# Patient Record
Sex: Female | Born: 1985 | Race: White | Hispanic: No | Smoking: Current every day smoker
Health system: Southern US, Community
[De-identification: ages and names within clinical notes are randomized; demographics above are authoritative.]

## PROBLEM LIST (undated history)

## (undated) DIAGNOSIS — J45909 Unspecified asthma, uncomplicated: Secondary | ICD-10-CM

## (undated) DIAGNOSIS — R569 Unspecified convulsions: Secondary | ICD-10-CM

## (undated) DIAGNOSIS — J449 Chronic obstructive pulmonary disease, unspecified: Secondary | ICD-10-CM

## (undated) DIAGNOSIS — I2699 Other pulmonary embolism without acute cor pulmonale: Secondary | ICD-10-CM

## (undated) DIAGNOSIS — J439 Emphysema, unspecified: Secondary | ICD-10-CM

## (undated) DIAGNOSIS — J189 Pneumonia, unspecified organism: Secondary | ICD-10-CM

## (undated) HISTORY — DX: Emphysema, unspecified: J43.9

## (undated) HISTORY — PX: CHOLECYSTECTOMY: SHX55

## (undated) HISTORY — DX: Other pulmonary embolism without acute cor pulmonale: I26.99

## (undated) HISTORY — DX: Chronic obstructive pulmonary disease, unspecified: J44.9

## (undated) HISTORY — DX: Pneumonia, unspecified organism: J18.9

---
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
## 2005-05-08 ENCOUNTER — Ambulatory Visit (HOSPITAL_COMMUNITY): Admission: RE | Admit: 2005-05-08 | Discharge: 2005-05-08 | Payer: Self-pay | Admitting: Family Medicine

## 2006-01-10 ENCOUNTER — Emergency Department (HOSPITAL_COMMUNITY): Admission: EM | Admit: 2006-01-10 | Discharge: 2006-01-10 | Payer: Self-pay | Admitting: Emergency Medicine

## 2006-01-10 ENCOUNTER — Ambulatory Visit (HOSPITAL_COMMUNITY): Admission: RE | Admit: 2006-01-10 | Discharge: 2006-01-10 | Payer: Self-pay | Admitting: Emergency Medicine

## 2007-03-05 ENCOUNTER — Emergency Department (HOSPITAL_COMMUNITY): Admission: EM | Admit: 2007-03-05 | Discharge: 2007-03-05 | Payer: Self-pay | Admitting: Emergency Medicine

## 2007-03-11 ENCOUNTER — Emergency Department (HOSPITAL_COMMUNITY): Admission: EM | Admit: 2007-03-11 | Discharge: 2007-03-11 | Payer: Self-pay | Admitting: Emergency Medicine

## 2007-04-12 ENCOUNTER — Emergency Department (HOSPITAL_COMMUNITY): Admission: EM | Admit: 2007-04-12 | Discharge: 2007-04-13 | Payer: Self-pay | Admitting: Emergency Medicine

## 2007-05-01 ENCOUNTER — Emergency Department (HOSPITAL_COMMUNITY): Admission: EM | Admit: 2007-05-01 | Discharge: 2007-05-02 | Payer: Self-pay | Admitting: Emergency Medicine

## 2007-09-16 ENCOUNTER — Emergency Department (HOSPITAL_COMMUNITY): Admission: EM | Admit: 2007-09-16 | Discharge: 2007-09-16 | Payer: Self-pay | Admitting: Emergency Medicine

## 2007-09-18 ENCOUNTER — Emergency Department (HOSPITAL_COMMUNITY): Admission: EM | Admit: 2007-09-18 | Discharge: 2007-09-18 | Payer: Self-pay | Admitting: Emergency Medicine

## 2007-09-30 ENCOUNTER — Emergency Department (HOSPITAL_COMMUNITY): Admission: EM | Admit: 2007-09-30 | Discharge: 2007-09-30 | Payer: Self-pay | Admitting: Emergency Medicine

## 2007-10-07 ENCOUNTER — Emergency Department (HOSPITAL_COMMUNITY): Admission: EM | Admit: 2007-10-07 | Discharge: 2007-10-07 | Payer: Self-pay | Admitting: Emergency Medicine

## 2007-11-20 ENCOUNTER — Emergency Department (HOSPITAL_COMMUNITY): Admission: EM | Admit: 2007-11-20 | Discharge: 2007-11-20 | Payer: Self-pay | Admitting: Emergency Medicine

## 2007-12-23 ENCOUNTER — Emergency Department (HOSPITAL_COMMUNITY): Admission: EM | Admit: 2007-12-23 | Discharge: 2007-12-23 | Payer: Self-pay | Admitting: Emergency Medicine

## 2008-01-23 ENCOUNTER — Emergency Department (HOSPITAL_COMMUNITY): Admission: EM | Admit: 2008-01-23 | Discharge: 2008-01-23 | Payer: Self-pay | Admitting: Emergency Medicine

## 2008-02-05 ENCOUNTER — Emergency Department (HOSPITAL_COMMUNITY): Admission: EM | Admit: 2008-02-05 | Discharge: 2008-02-05 | Payer: Self-pay | Admitting: Emergency Medicine

## 2008-02-06 ENCOUNTER — Emergency Department (HOSPITAL_COMMUNITY): Admission: EM | Admit: 2008-02-06 | Discharge: 2008-02-06 | Payer: Self-pay | Admitting: Emergency Medicine

## 2008-02-07 ENCOUNTER — Encounter (HOSPITAL_COMMUNITY): Admission: AD | Admit: 2008-02-07 | Discharge: 2008-03-02 | Payer: Self-pay | Admitting: Emergency Medicine

## 2008-02-10 ENCOUNTER — Emergency Department (HOSPITAL_COMMUNITY): Admission: EM | Admit: 2008-02-10 | Discharge: 2008-02-10 | Payer: Self-pay | Admitting: Emergency Medicine

## 2008-04-06 ENCOUNTER — Emergency Department (HOSPITAL_COMMUNITY): Admission: EM | Admit: 2008-04-06 | Discharge: 2008-04-06 | Payer: Self-pay | Admitting: Emergency Medicine

## 2008-06-30 ENCOUNTER — Emergency Department (HOSPITAL_COMMUNITY): Admission: EM | Admit: 2008-06-30 | Discharge: 2008-06-30 | Payer: Self-pay | Admitting: Emergency Medicine

## 2008-08-15 ENCOUNTER — Emergency Department (HOSPITAL_COMMUNITY): Admission: EM | Admit: 2008-08-15 | Discharge: 2008-08-15 | Payer: Self-pay | Admitting: Emergency Medicine

## 2008-08-25 ENCOUNTER — Emergency Department (HOSPITAL_COMMUNITY): Admission: EM | Admit: 2008-08-25 | Discharge: 2008-08-25 | Payer: Self-pay | Admitting: Emergency Medicine

## 2008-10-18 ENCOUNTER — Emergency Department (HOSPITAL_COMMUNITY): Admission: EM | Admit: 2008-10-18 | Discharge: 2008-10-18 | Payer: Self-pay | Admitting: Emergency Medicine

## 2008-11-29 ENCOUNTER — Emergency Department (HOSPITAL_COMMUNITY): Admission: EM | Admit: 2008-11-29 | Discharge: 2008-11-29 | Payer: Self-pay | Admitting: Emergency Medicine

## 2009-06-06 ENCOUNTER — Emergency Department (HOSPITAL_COMMUNITY): Admission: EM | Admit: 2009-06-06 | Discharge: 2009-06-06 | Payer: Self-pay | Admitting: Emergency Medicine

## 2009-06-15 ENCOUNTER — Emergency Department (HOSPITAL_COMMUNITY): Admission: EM | Admit: 2009-06-15 | Discharge: 2009-06-15 | Payer: Self-pay | Admitting: Emergency Medicine

## 2009-08-13 ENCOUNTER — Emergency Department (HOSPITAL_COMMUNITY): Admission: EM | Admit: 2009-08-13 | Discharge: 2009-08-13 | Payer: Self-pay | Admitting: Emergency Medicine

## 2009-09-14 ENCOUNTER — Emergency Department (HOSPITAL_COMMUNITY): Admission: EM | Admit: 2009-09-14 | Discharge: 2009-09-14 | Payer: Self-pay | Admitting: Emergency Medicine

## 2009-09-23 ENCOUNTER — Emergency Department (HOSPITAL_COMMUNITY): Admission: EM | Admit: 2009-09-23 | Discharge: 2009-09-23 | Payer: Self-pay | Admitting: Emergency Medicine

## 2009-10-28 ENCOUNTER — Emergency Department (HOSPITAL_COMMUNITY): Admission: EM | Admit: 2009-10-28 | Discharge: 2009-10-28 | Payer: Self-pay | Admitting: Emergency Medicine

## 2010-03-26 ENCOUNTER — Encounter: Payer: Self-pay | Admitting: Family Medicine

## 2010-03-27 ENCOUNTER — Encounter: Payer: Self-pay | Admitting: Specialist

## 2010-05-20 LAB — URINALYSIS, ROUTINE W REFLEX MICROSCOPIC
Glucose, UA: NEGATIVE mg/dL
Nitrite: NEGATIVE
Specific Gravity, Urine: 1.025 (ref 1.005–1.030)
pH: 6.5 (ref 5.0–8.0)

## 2010-05-20 LAB — WET PREP, GENITAL: Clue Cells Wet Prep HPF POC: NONE SEEN

## 2010-05-20 LAB — GC/CHLAMYDIA PROBE AMP, GENITAL
Chlamydia, DNA Probe: NEGATIVE
GC Probe Amp, Genital: NEGATIVE

## 2010-05-24 LAB — URINALYSIS, ROUTINE W REFLEX MICROSCOPIC
Hgb urine dipstick: NEGATIVE
Ketones, ur: NEGATIVE mg/dL
Nitrite: NEGATIVE
Protein, ur: NEGATIVE mg/dL

## 2010-05-24 LAB — PREGNANCY, URINE: Preg Test, Ur: NEGATIVE

## 2010-05-24 LAB — RAPID STREP SCREEN (MED CTR MEBANE ONLY): Streptococcus, Group A Screen (Direct): NEGATIVE

## 2010-06-10 LAB — URINALYSIS, ROUTINE W REFLEX MICROSCOPIC
Bilirubin Urine: NEGATIVE
Glucose, UA: NEGATIVE mg/dL
Ketones, ur: NEGATIVE mg/dL
Nitrite: POSITIVE — AB
Protein, ur: 100 mg/dL — AB
Specific Gravity, Urine: >1.03 — ABNORMAL HIGH (ref 1.005–1.030)
pH: 6.5 (ref 5.0–8.0)

## 2010-06-10 LAB — URINE MICROSCOPIC-ADD ON

## 2010-06-12 LAB — URINE CULTURE: Colony Count: >=100000

## 2010-06-12 LAB — URINE MICROSCOPIC-ADD ON

## 2010-06-12 LAB — URINALYSIS, ROUTINE W REFLEX MICROSCOPIC
Glucose, UA: NEGATIVE mg/dL
Glucose, UA: NEGATIVE mg/dL
Nitrite: NEGATIVE
Nitrite: POSITIVE — AB
Protein, ur: 100 mg/dL — AB
Protein, ur: 100 mg/dL — AB
Specific Gravity, Urine: 1.025 (ref 1.005–1.030)
Specific Gravity, Urine: 1.02 (ref 1.005–1.030)
Urobilinogen, UA: 0.2 mg/dL (ref 0.0–1.0)

## 2010-06-20 LAB — URINE MICROSCOPIC-ADD ON

## 2010-06-20 LAB — URINALYSIS, ROUTINE W REFLEX MICROSCOPIC
Nitrite: POSITIVE — AB
Specific Gravity, Urine: <1.005 — ABNORMAL LOW (ref 1.005–1.030)
Urobilinogen, UA: 0.2 mg/dL (ref 0.0–1.0)

## 2010-06-20 LAB — URINE CULTURE

## 2010-07-21 NOTE — H&P (Signed)
[name], DAHLEM               ACCOUNT NO.:  [id number]   MEDICAL RECORD NO.:  [id number]          PATIENT TYPE:   LOCATION:  LDR2                          FACILITY:  [facility]   PHYSICIAN:  Tilda Burrow, M.D. DATE OF BIRTH:  02-Mar-1986   DATE OF ADMISSION:  02/02/2004  DATE OF DISCHARGE:  LH                                HISTORY & PHYSICAL   REASON FOR ADMISSION:  Pregnancy at 39 weeks and 3 days for elective  induction of labor due to maternal discomfort, transportation concerns.   HISTORY OF PRESENT ILLNESS:  Diane Norton is an 25 year old, gravida 2, para 1, with  EDC of December 1, who desires elective induction of labor.  Discussed risks  and benefits.  The patient is aware since she had previously been induced  with her other pregnancy and desires same with this pregnancy.   PAST MEDICAL HISTORY:  Negative.   PAST SURGICAL HISTORY:  Negative.   ALLERGIES:  No known drug allergies.   SOCIAL HISTORY:  She is single.  The father of the baby is supportive.   PRENATAL COURSE:  Essentially uneventful.  Blood type is O positive.  Rubella is immune.  Hepatitis B surface antigen negative.  HIV nonreactive.  Serology negative.  Pap within normal limits.  GC and chlamydia:  Both  cultures were negative.  MSAFP was normal.  GBS was negative.   PHYSICAL EXAMINATION:  Today, weight is 162.  Blood pressure 112/62.  Fetal  heart rate 140, strong and regular.  There is good fetal movement noted per  the patient.  Cervix is 2 to 3 cm, 70% effaced, minus 1 station.   PLAN:  She is to admitted for Pitocin induction of labor on Wednesday  morning at 5:30.     [name]   DL/MEDQ  D:  04/54/0981  T:  01/31/2004  Job:  [id number]

## 2010-07-21 NOTE — Op Note (Signed)
   [name], Diane Norton                         ACCOUNT NO.:  [id number]   MEDICAL RECORD NO.:  [id number]                   PATIENT TYPE:  INP   LOCATION:  A416                                 FACILITY:  [facility]   PHYSICIAN:  Tilda Burrow, M.D.              DATE OF BIRTH:  [birth year]   DATE OF PROCEDURE:  01/08/2002  DATE OF DISCHARGE:  01/10/2002                                 OPERATIVE REPORT   TIME:  8:20 a.m.   PROCEDURE:  Lumbar epidural catheter placement.   SURGEON:  Tilda Burrow, M.D.   INDICATIONS:  A 25 year old prime nulliparous female admitted in labor,  requesting epidural analgesia.   DESCRIPTION OF PROCEDURE:  The patient was placed in the sitting position;  prepped and draped in the usual fashion for lumbar epidural catheter  placement with loss of resistance used after the back was appropriately  prepped and draped, with local skin wheal raised with lidocaine 1%.  A 19-  gauge Touhy needle was used to identify the epidural space, with successful  identification using loss of resistance technique.   The 5 mL test dose of 1.5% lidocaine with epinephrine x5 mL was then used  and then the epidural catheter threaded, with successful placement and  removal of the Touhy needle and taping of the catheter to the back.  The  patient had a bolus of 7 mL of 0.25% Marcaine solution instilled and then a  12 mL continuous infusion initiated. The procedure was successfully  completed with analgesic level at T12 at 10 minutes postprocedure.  Blood  pressures remained acceptable.  The patient tolerated the procedure well.                                               Tilda Burrow, M.D.    JVF/MEDQ  D:  03/28/2002  T:  03/28/2002  Job:  [id number]

## 2010-07-21 NOTE — Group Therapy Note (Signed)
[name], Diane Norton               ACCOUNT NO.:  [id number]   MEDICAL RECORD NO.:  [id number]          PATIENT TYPE:  INP   LOCATION:  LDR1                          FACILITY:  [facility]   PHYSICIAN:  Tilda Burrow, M.D. DATE OF BIRTH:  03-26-1985   DATE OF PROCEDURE:  02/02/2004  DATE OF DISCHARGE:                                   PROGRESS NOTE   Onset of labor is February 02, 2004 at 6:15 a.m. Date of delivery February 02, 2004. Delivery time at 10:30 a.m. Length of first stage labor 4 hours  and 2 minutes. Length of second stage labor 13 minutes. Length of third  stage labor 5 minutes.   DELIVERY NOTE:  [name] had a normal spontaneous delivery of a viable female  infant under epidural anesthesia. Upon delivery of head, a nuchal cord was  noted which was loosened without difficulty and the infant slipped through  without complications. Upon delivery, infant was thoroughly suctioned,  dried, and placed on the mother's abdomen. Cord clamped and cut. Apgars were  9 and 9. Infant weight 7 pounds 8 ounces. Upon inspection, peritoneum was  noted to be intact with slight bruising at the base of the perineum. Third  stage of labor was actually managed with 20 units of Pitocin with D5 LR at a  rapid rate. Placenta was delivered spontaneously via Schultz's mechanism.  Three-vessel cord was noted upon inspection. Membranes were noted to be  intact upon inspection_. Estimated blood loss 350 cc. Epidural catheter was  removed with blue tip intact. Infant and mother stabilized and transferred  out to the post partum unit in stable condition.     [name]   DL/MEDQ  D:  40/98/1191  T:  02/02/2004  Job:  [id number]

## 2010-07-21 NOTE — Op Note (Signed)
   [name], Diane Norton                         ACCOUNT NO.:  [id number]   MEDICAL RECORD NO.:  [id number]                   PATIENT TYPE:  INP   LOCATION:  A416                                 FACILITY:  [facility]   PHYSICIAN:  Tilda Burrow, M.D.              DATE OF BIRTH:  [birth year]   DATE OF PROCEDURE:  DATE OF DISCHARGE:                                 OPERATIVE REPORT   DELIVERY NOTE:  After an hour of being complete at +2 station the patient  still had no urge to push.  However, with directed pushing she did push  quite effectively.  She began pushing at approximately 1300 and after a  brief second stage delivered a viable female infant at 61.  A loose nuchal  cord was easily reduced and directly following delivery of the body there  was noted to be a large amount of what appeared to be meconium stained fluid  and terminal meconium.  The infant was not stimulated and brought directly  to the radiant warmer, however, she did make some respiratory efforts and  therefore endotracheal suctioning was deferred.   She did, however, receive vigorous DeLee suctioning.  Apgars were 8 and 9.  Weight 7 pounds 2 ounces.  Twenty units of Pitocin diluted in 1000 cc of  lactated Ringers was then infused rapidly IV.  The placenta separated  spontaneously and delivered by controlled cord traction at 1335.  It was  inspected and appears to be intact with a 3-vessel cord.  There is only  minimal meconium staining to the membranes.  The vagina was then inspected  and a no lacerations were found.  The fundus was massaged until firm.  Estimated blood loss approximately 400 cc.  The epidural catheter was  removed with the blue tip noted to be intact.     Jacklyn Shell, C.N.M.          Tilda Burrow, M.D.    FC/MEDQ  D:  01/08/2002  T:  01/08/2002  Job:  [id number]   cc:   [facility]

## 2010-07-21 NOTE — H&P (Signed)
   [name], Diane Norton NO.:  [id number]   MEDICAL RECORD NO.:  [id number]                  PATIENT TYPE:   LOCATION:                                       FACILITY:   PHYSICIAN:  Tilda Burrow, M.D.              DATE OF BIRTH:   DATE OF ADMISSION:  01/07/2002  DATE OF DISCHARGE:                                HISTORY & PHYSICAL   REASON FOR ADMISSION:  Pregnancy at 63 weeks' gestation for elective  induction of labor due to maternal fatigue.   HISTORY OF PRESENT ILLNESS:  The patient is a 25 year old gravida 1 with a  consistent EDC of January 07, 2002, who is in today for a visit, complains  of malaise, feeling bad, and inability to sleep, and requesting induction.  After discussing risks and benefits of induction of labor versus going into  labor on her own, the patient still desires to continue with induction of  labor.   PAST MEDICAL HISTORY:  1. 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  2. Asthma.  3. Depression.   MEDICATIONS:  She is not on any medications right now, except prenatal  vitamins.   PAST SURGICAL HISTORY:  Negative.   ALLERGIES:  She has seasonal allergies.   SOCIAL HISTORY:  She is single and she lives with the father of the baby and  they have a stable relationship.   PRENATAL COURSE:  Has essentially been uneventful.  Blood type is O  positive.  UDS was negative.  Rubella is pending.  Hepatitis B surface  antigen is negative.  HIV is negative.  Pap is class 1.  GC and Chlamydia  are both negative.  MSAFP within normal limits.  A 28 week hemoglobin was  11.8, 28 week hematocrit 35.4, one hour glucose tolerance is 100, GBS was  negative.   PHYSICAL EXAMINATION:  VITAL SIGNS:  Blood pressure 110/68, weight is 181  pounds, fundal height is 36 cm, fetal heart tone is 130, strong and regular,  and the patient states the baby is being good and active with fetal  movement.  PELVIC:  Cervix is 1-2 cm, 70% effaced and -1  station.  I discussed plan of  care with Dr. Christin Bach and he is in agreement with proceeding with  induction of labor at mother's request.   PLAN:  We are going to admit for __________ induction of labor and PNVAM.     Zerita Boers, Lanier Clam                      Tilda Burrow, M.D.    DL/MEDQ  D:  32/44/0102  T:  01/07/2002  Job:  [id number]   cc:   [facility]

## 2010-07-21 NOTE — Op Note (Signed)
[name], Diane Norton               ACCOUNT NO.:  [id number]   MEDICAL RECORD NO.:  [id number]          PATIENT TYPE:  INP   LOCATION:  LDR1                          FACILITY:  [facility]   PHYSICIAN:  Lazaro Arms, M.D.   DATE OF BIRTH:  28-Oct-1985   DATE OF PROCEDURE:  02/02/2004  DATE OF DISCHARGE:                                 OPERATIVE REPORT   EPIDURAL PLACEMENT NOTE:  [name] is an 25 year old gravida 2, para 1, at 39+  weeks' gestation, who was admitted this morning for induction.  She is  complaining of pain with contractions and requesting an epidural.   The patient is placed in sitting position, Betadine prep is used.  The L3-4  interspace is identified, 1% lidocaine is injected, a field drape placed.  A  17-gauge Tuohy needle is used and a loss of resistance technique employed  and with one pass, the epidural space is found without difficulty.  Bupivacaine 0.125% 10 mL is given through the needle.  The epidural catheter  is then fed 5 cm into the epidural space.  An additional 10 mL is given as a  bolus without ill effects.  The epidural is then taped down and the  continuous infusion of 0.125% bupivacaine with 2 mcg/mL of fentanyl is begun  at 12 mL/hr.  The patient tolerated the procedure well.  She is receiving  comfort, and fetal heart rate tracing and blood pressure are stable.     Luth   LHE/MEDQ  D:  02/02/2004  T:  02/02/2004  Job:  [id number]

## 2010-11-24 LAB — DIFFERENTIAL
Eosinophils Absolute: 0.2
Eosinophils Relative: 2
Lymphs Abs: 4.5 — ABNORMAL HIGH

## 2010-11-24 LAB — CBC
RBC: 4.41
WBC: 9.5

## 2010-11-24 LAB — COMPREHENSIVE METABOLIC PANEL
ALT: 14
AST: 15
CO2: 25
Calcium: 8.9
Chloride: 105
GFR calc Af Amer: >60
GFR calc non Af Amer: >60
Potassium: 3.8
Sodium: 140
Total Bilirubin: 0.3

## 2010-11-24 LAB — LIPASE, BLOOD: Lipase: 15

## 2010-11-24 LAB — AMYLASE: Amylase: 42

## 2010-11-30 LAB — COMPREHENSIVE METABOLIC PANEL
BUN: 12
Calcium: 8.8
Creatinine, Ser: 0.57
Glucose, Bld: 106 — ABNORMAL HIGH
Sodium: 137
Total Protein: 6.4

## 2010-11-30 LAB — CBC
HCT: 37
Hemoglobin: 12.7
MCHC: 34.3
MCV: 95.3
RDW: 13.4

## 2010-11-30 LAB — DIFFERENTIAL
Lymphs Abs: 3.1
Monocytes Relative: 7
Neutro Abs: 7.1
Neutrophils Relative %: 64

## 2010-11-30 LAB — URINALYSIS, ROUTINE W REFLEX MICROSCOPIC
Nitrite: NEGATIVE
pH: 6.5

## 2010-12-05 LAB — BASIC METABOLIC PANEL
BUN: 13
CO2: 27
Calcium: 8.6
Glucose, Bld: 87
Sodium: 137

## 2010-12-05 LAB — CBC
HCT: 35.5 — ABNORMAL LOW
Hemoglobin: 12.1
MCHC: 33.9
RDW: 16.7 — ABNORMAL HIGH

## 2010-12-05 LAB — DIFFERENTIAL
Basophils Absolute: 0.1
Basophils Relative: 1
Eosinophils Relative: 2
Monocytes Absolute: 0.7
Neutro Abs: 4.4

## 2010-12-05 LAB — URINALYSIS, ROUTINE W REFLEX MICROSCOPIC
Bilirubin Urine: NEGATIVE
Hgb urine dipstick: NEGATIVE
Ketones, ur: NEGATIVE
Urobilinogen, UA: 0.2
pH: 5.5

## 2010-12-05 LAB — PREGNANCY, URINE: Preg Test, Ur: NEGATIVE

## 2010-12-07 LAB — CBC
Platelets: 233 10*3/uL (ref 150–400)
WBC: 9.1 10*3/uL (ref 4.0–10.5)

## 2010-12-07 LAB — DIFFERENTIAL
Eosinophils Absolute: 0.1 10*3/uL (ref 0.0–0.7)
Lymphocytes Relative: 30 % (ref 12–46)
Lymphs Abs: 2.7 10*3/uL (ref 0.7–4.0)
Neutrophils Relative %: 58 % (ref 43–77)

## 2010-12-07 LAB — BASIC METABOLIC PANEL
BUN: 8 mg/dL (ref 6–23)
Creatinine, Ser: 0.47 mg/dL (ref 0.4–1.2)
GFR calc non Af Amer: >60 mL/min (ref >60)

## 2011-05-01 ENCOUNTER — Other Ambulatory Visit (HOSPITAL_COMMUNITY)
Admission: RE | Admit: 2011-05-01 | Discharge: 2011-05-01 | Disposition: A | Discharge status: Home / Self Care | Payer: Self-pay | Source: Ambulatory Visit | Attending: Unknown Physician Specialty | Admitting: Unknown Physician Specialty

## 2011-05-01 ENCOUNTER — Other Ambulatory Visit: Payer: Self-pay | Admitting: Nurse Practitioner

## 2011-05-01 DIAGNOSIS — R87619 Unspecified abnormal cytological findings in specimens from cervix uteri: Secondary | ICD-10-CM | POA: Insufficient documentation

## 2011-05-01 DIAGNOSIS — R87613 High grade squamous intraepithelial lesion on cytologic smear of cervix (HGSIL): Secondary | ICD-10-CM | POA: Insufficient documentation

## 2011-09-18 ENCOUNTER — Encounter (HOSPITAL_COMMUNITY): Payer: Self-pay

## 2011-09-18 ENCOUNTER — Emergency Department (HOSPITAL_COMMUNITY)
Admission: EM | Admit: 2011-09-18 | Discharge: 2011-09-18 | Disposition: A | Discharge status: Home / Self Care | Payer: Self-pay | Attending: Emergency Medicine | Admitting: Emergency Medicine

## 2011-09-18 DIAGNOSIS — N390 Urinary tract infection, site not specified: Secondary | ICD-10-CM | POA: Insufficient documentation

## 2011-09-18 DIAGNOSIS — R300 Dysuria: Secondary | ICD-10-CM | POA: Insufficient documentation

## 2011-09-18 DIAGNOSIS — J45909 Unspecified asthma, uncomplicated: Secondary | ICD-10-CM | POA: Insufficient documentation

## 2011-09-18 DIAGNOSIS — F17200 Nicotine dependence, unspecified, uncomplicated: Secondary | ICD-10-CM | POA: Insufficient documentation

## 2011-09-18 DIAGNOSIS — R109 Unspecified abdominal pain: Secondary | ICD-10-CM | POA: Insufficient documentation

## 2011-09-18 HISTORY — DX: Unspecified asthma, uncomplicated: J45.909

## 2011-09-18 LAB — URINE MICROSCOPIC-ADD ON

## 2011-09-18 LAB — URINALYSIS, ROUTINE W REFLEX MICROSCOPIC
Glucose, UA: NEGATIVE mg/dL
Leukocytes, UA: NEGATIVE
Protein, ur: NEGATIVE mg/dL
pH: 6 (ref 5.0–8.0)

## 2011-09-18 MED ORDER — PHENAZOPYRIDINE HCL 100 MG PO TABS
200.0000 mg | ORAL_TABLET | Freq: Once | ORAL | Status: AC
  Administered 2011-09-18: 200 mg via ORAL
  Filled 2011-09-18: qty 2

## 2011-09-18 MED ORDER — PHENAZOPYRIDINE HCL 200 MG PO TABS: 200.0000 mg | ORAL_TABLET | Freq: Three times a day (TID) | ORAL | Status: AC

## 2011-09-18 MED ORDER — NITROFURANTOIN MACROCRYSTAL 100 MG PO CAPS: 100.0000 mg | ORAL_CAPSULE | Freq: Two times a day (BID) | ORAL | Status: AC

## 2011-09-18 MED ORDER — NITROFURANTOIN MACROCRYSTAL 100 MG PO CAPS
100.0000 mg | ORAL_CAPSULE | Freq: Once | ORAL | Status: AC
  Administered 2011-09-18: 100 mg via ORAL
  Filled 2011-09-18: qty 1

## 2011-09-18 NOTE — ED Provider Notes (Signed)
History     CSN: [id number]  Arrival date & time 09/18/11  0228   First MD Initiated Contact with Patient 09/18/11 0320      Chief Complaint  Patient presents with  . Flank Pain    (Consider location/radiation/quality/duration/timing/severity/associated sxs/prior treatment) HPI  Diane Norton is a 26 y.o. female who presents to the Emergency Department complaining of urinary frequency and urgency x 3 days associated with right flank pain. She denies fever, chills, nausea, vomiting. She has taken no medicines.   Past Medical History  Diagnosis Date  . Asthma     Past Surgical History  Procedure Date  . Cholecystectomy     History reviewed. No pertinent family history.  History  Substance Use Topics  . Smoking status: Current Everyday Smoker  . Smokeless tobacco: Not on file  . Alcohol Use: No    OB History    Grav Para Term Preterm Abortions TAB SAB Ect Mult Living                  Review of Systems  Constitutional: Negative for fever.       10 Systems reviewed and are negative for acute change except as noted in the HPI.  HENT: Negative for congestion.   Eyes: Negative for discharge and redness.  Respiratory: Negative for cough and shortness of breath.   Cardiovascular: Negative for chest pain.  Gastrointestinal: Negative for vomiting and abdominal pain.  Genitourinary: Positive for dysuria, urgency and flank pain.  Musculoskeletal: Negative for back pain.  Skin: Negative for rash.  Neurological: Negative for syncope, numbness and headaches.  Psychiatric/Behavioral:       No behavior change.    Allergies  Ciprofloxacin and Penicillins  Home Medications  No current outpatient prescriptions on file.  BP 110/69  Pulse 102  Temp 99.4 F (37.4 C) (Oral)  Resp 20  Ht 5\' 4"  (1.626 m)  Wt 150 lb (68.04 kg)  BMI 25.75 kg/m2  SpO2 98%  LMP 09/15/2011  Physical Exam  Nursing note and vitals reviewed. Constitutional: She appears well-developed and  well-nourished. No distress.       Awake, alert, nontoxic appearance.  HENT:  Head: Atraumatic.  Eyes: Right eye exhibits no discharge. Left eye exhibits no discharge.  Neck: Neck supple.  Pulmonary/Chest: Effort normal. She exhibits no tenderness.  Abdominal: Soft. There is no tenderness. There is no rebound.  Genitourinary:       No cva tenderness (Patient pulled away from me before I touches either flank)  Musculoskeletal: She exhibits no tenderness.       Baseline ROM, no obvious new focal weakness.  Neurological:       Mental status and motor strength appears baseline for patient and situation.  Skin: No rash noted.  Psychiatric: She has a normal mood and affect.    ED Course  Procedures (including critical care time) Results for orders placed during the hospital encounter of 09/18/11  URINALYSIS, ROUTINE W REFLEX MICROSCOPIC      Component Value Range   Color, Urine STRAW (*) YELLOW   APPearance HAZY (*) CLEAR   Specific Gravity, Urine 1.015  1.005 - 1.030   pH 6.0  5.0 - 8.0   Glucose, UA NEGATIVE  NEGATIVE mg/dL   Hgb urine dipstick MODERATE (*) NEGATIVE   Bilirubin Urine NEGATIVE  NEGATIVE   Ketones, ur NEGATIVE  NEGATIVE mg/dL   Protein, ur NEGATIVE  NEGATIVE mg/dL   Urobilinogen, UA 0.2  0.0 - 1.0 mg/dL  Nitrite POSITIVE (*) NEGATIVE   Leukocytes, UA NEGATIVE  NEGATIVE  URINE MICROSCOPIC-ADD ON      Component Value Range   Squamous Epithelial / LPF MANY (*) RARE   WBC, UA 21-50  <3 WBC/hpf   RBC / HPF 3-6  <3 RBC/hpf   Bacteria, UA MANY (*) RARE   Urine-Other MUCOUS PRESENT            MDM  Patient with urinary symptoms x 3 days. UA with infection. Initiated antibiotic therapy and pyridium. Pt stable in ED with no significant deterioration in condition.The patient appears reasonably screened and/or stabilized for discharge and I doubt any other medical condition or other [facility] requiring further screening, evaluation, or treatment in the ED at this time  prior to discharge.  MDM Reviewed: nursing note and vitals Interpretation: labs           Nicoletta Dress. Colon Branch, MD 09/18/11 [phone]

## 2011-09-18 NOTE — ED Notes (Signed)
Pt c/o right flank pain and burning with urination for 3 days

## 2012-12-23 ENCOUNTER — Emergency Department (HOSPITAL_COMMUNITY)
Admission: EM | Admit: 2012-12-23 | Discharge: 2012-12-23 | Disposition: A | Discharge status: Home / Self Care | Payer: Self-pay | Attending: Emergency Medicine | Admitting: Emergency Medicine

## 2012-12-23 ENCOUNTER — Encounter (HOSPITAL_COMMUNITY): Payer: Self-pay | Admitting: Emergency Medicine

## 2012-12-23 ENCOUNTER — Emergency Department (HOSPITAL_COMMUNITY): Payer: Self-pay

## 2012-12-23 DIAGNOSIS — J329 Chronic sinusitis, unspecified: Secondary | ICD-10-CM

## 2012-12-23 DIAGNOSIS — J40 Bronchitis, not specified as acute or chronic: Secondary | ICD-10-CM

## 2012-12-23 DIAGNOSIS — Z880 Allergy status to penicillin: Secondary | ICD-10-CM | POA: Insufficient documentation

## 2012-12-23 DIAGNOSIS — F17200 Nicotine dependence, unspecified, uncomplicated: Secondary | ICD-10-CM | POA: Insufficient documentation

## 2012-12-23 DIAGNOSIS — J45909 Unspecified asthma, uncomplicated: Secondary | ICD-10-CM | POA: Insufficient documentation

## 2012-12-23 MED ORDER — CLINDAMYCIN HCL 150 MG PO CAPS: ORAL_CAPSULE | ORAL | Status: DC

## 2012-12-23 MED ORDER — PHENYLEPH-DIPHENHYD-CODEINE 7.5-10-7.5 MG/5ML PO SYRP: ORAL_SOLUTION | ORAL | Status: DC

## 2012-12-23 MED ORDER — HYDROCOD POLST-CHLORPHEN POLST 10-8 MG/5ML PO LQCR
5.0000 mL | Freq: Once | ORAL | Status: AC
  Administered 2012-12-23: 5 mL via ORAL
  Filled 2012-12-23: qty 5

## 2012-12-23 MED ORDER — PREDNISONE 10 MG PO TABS: ORAL_TABLET | ORAL | Status: DC

## 2012-12-23 NOTE — ED Notes (Signed)
Pt states that she already has a prescription for prednisone as she was seen recently for same at [facility]

## 2012-12-23 NOTE — ED Provider Notes (Signed)
CSN: [id number]     Arrival date & time 12/23/12  1321 History   First MD Initiated Contact with Patient 12/23/12 1346     Chief Complaint  Patient presents with  . Cough   (Consider location/radiation/quality/duration/timing/severity/associated sxs/prior Treatment) Patient is a 27 y.o. female presenting with cough. The history is provided by the patient.  Cough Cough characteristics:  Productive Sputum characteristics:  Yellow Severity:  Moderate Onset quality:  Gradual Duration:  4 days Timing:  Intermittent Progression:  Worsening Chronicity:  New Smoker: yes   Context: sick contacts and weather changes   Relieved by:  Nothing Worsened by:  Deep breathing Associated symptoms: chills and sinus congestion   Associated symptoms: no chest pain, no eye discharge, no shortness of breath and no wheezing   Risk factors: no recent travel     Past Medical History  Diagnosis Date  . Asthma    Past Surgical History  Procedure Laterality Date  . Cholecystectomy     History reviewed. No pertinent family history. History  Substance Use Topics  . Smoking status: Current Every Day Smoker  . Smokeless tobacco: Not on file  . Alcohol Use: No   OB History   Grav Para Term Preterm Abortions TAB SAB Ect Mult Living                 Review of Systems  Constitutional: Positive for chills. Negative for activity change.       All ROS Neg except as noted in HPI  HENT: Negative for nosebleeds.   Eyes: Negative for photophobia and discharge.  Respiratory: Positive for cough. Negative for shortness of breath and wheezing.   Cardiovascular: Negative for chest pain and palpitations.  Gastrointestinal: Negative for abdominal pain and blood in stool.  Genitourinary: Negative for dysuria, frequency and hematuria.  Musculoskeletal: Negative for arthralgias, back pain and neck pain.  Skin: Negative.   Neurological: Negative for dizziness, seizures and speech difficulty.   Psychiatric/Behavioral: Negative for hallucinations and confusion.    Allergies  Darvocet; Penicillins; Bactrim; and Ciprofloxacin  Home Medications  No current outpatient prescriptions on file. BP 110/56  Pulse 62  Temp(Src) 98.1 F (36.7 C) (Oral)  Resp 20  SpO2 95%  LMP 12/23/2012 Physical Exam  Nursing note and vitals reviewed. Constitutional: She is oriented to person, place, and time. She appears well-developed and well-nourished.  Non-toxic appearance.  HENT:  Head: Normocephalic.  Right Ear: Tympanic membrane and external ear normal.  Left Ear: Tympanic membrane and external ear normal.  Nasal congestion present.  Eyes: EOM and lids are normal. Pupils are equal, round, and reactive to light.  Neck: Normal range of motion. Neck supple. Carotid bruit is not present.  Cardiovascular: Normal rate, regular rhythm, normal heart sounds, intact distal pulses and normal pulses.   Pulmonary/Chest: Breath sounds normal. No respiratory distress.  Scattered rhonchi present.  Abdominal: Soft. Bowel sounds are normal. There is no tenderness. There is no guarding.  Musculoskeletal: Normal range of motion.  Lymphadenopathy:       Head (right side): No submandibular adenopathy present.       Head (left side): No submandibular adenopathy present.    She has no cervical adenopathy.  Neurological: She is alert and oriented to person, place, and time. She has normal strength. No cranial nerve deficit or sensory deficit.  Skin: Skin is warm and dry.  Psychiatric: She has a normal mood and affect. Her speech is normal.    ED Course  Procedures (including critical  care time) Labs Review Labs Reviewed - No data to display Imaging Review Dg Chest 2 View  12/23/2012   CLINICAL DATA:  Productive cough  EXAM: CHEST  2 VIEW  COMPARISON:  December 22, 2012  FINDINGS: The lungs are mildly hyperexpanded but clear. Heart size and pulmonary vascularity are normal. No adenopathy. No bone lesions.  There is slight upper thoracic levoscoliosis.  IMPRESSION: No edema or consolidation.   Electronically Signed   By: Bretta Bang M.D.   On: 12/23/2012 13:56    EKG Interpretation   None       MDM  No diagnosis found. **I have reviewed nursing notes, vital signs, and all appropriate lab and imaging results for this patient.*  Chest xray is negative for pneumonia or acute problem. Pt treated with clindamycin, endal, and prednisone. Medical social worker came to ED and provided pt with assistance with medications.    Kathie Dike, PA-C 12/23/12 2254

## 2012-12-23 NOTE — ED Notes (Signed)
Pt c/o prod cough with yellow phlegm x 4 days. States has had fever. Nad. Light cough noted.

## 2012-12-23 NOTE — ED Notes (Signed)
Pt with productive cough for several days with fever and rib and back pain

## 2012-12-24 NOTE — ED Provider Notes (Signed)
Medical screening examination/treatment/procedure(s) were performed by non-physician practitioner and as supervising physician I was immediately available for consultation/collaboration.  EKG Interpretation   None         Kristen N Ward, DO 12/24/12 0715 

## 2012-12-25 NOTE — Care Management Note (Signed)
    Page 1 of 1   12/23/2012     4:05:46 PM   CARE MANAGEMENT NOTE 12/23/2012  Patient:  Diane Norton, Diane Norton   Account Number:  [id number]  Date Initiated:  12/23/2012  Documentation initiated by:  Rosemary Holms  Subjective/Objective Assessment:   Pt seen in ED with no insurance. Requested assistance with Rxs. MATCH voucher given to pt. Program explained to pt.     Action/Plan:   Anticipated DC Date:  12/23/2012   Anticipated DC Plan:  HOME/SELF CARE      DC Planning Services  CM consult  MATCH Program      Choice offered to / List presented to:             Status of service:  Completed, signed off Medicare Important Message given?   (If response is "NO", the following Medicare IM given date fields will be blank) Date Medicare IM given:   Date Additional Medicare IM given:    Discharge Disposition:  HOME/SELF CARE  Per UR Regulation:    If discussed at Long Length of Stay Meetings, dates discussed:    Comments:  12/23/12 1600 Maisen Klingler Leanord Hawking RN BSN CM

## 2013-11-14 ENCOUNTER — Emergency Department (HOSPITAL_COMMUNITY)
Admission: EM | Admit: 2013-11-14 | Discharge: 2013-11-14 | Disposition: A | Discharge status: Home / Self Care | Payer: Self-pay | Attending: Emergency Medicine | Admitting: Emergency Medicine

## 2013-11-14 ENCOUNTER — Encounter (HOSPITAL_COMMUNITY): Payer: Self-pay | Admitting: Emergency Medicine

## 2013-11-14 DIAGNOSIS — J45909 Unspecified asthma, uncomplicated: Secondary | ICD-10-CM | POA: Insufficient documentation

## 2013-11-14 DIAGNOSIS — F17200 Nicotine dependence, unspecified, uncomplicated: Secondary | ICD-10-CM | POA: Insufficient documentation

## 2013-11-14 DIAGNOSIS — K0889 Other specified disorders of teeth and supporting structures: Secondary | ICD-10-CM

## 2013-11-14 DIAGNOSIS — K029 Dental caries, unspecified: Secondary | ICD-10-CM | POA: Insufficient documentation

## 2013-11-14 DIAGNOSIS — K089 Disorder of teeth and supporting structures, unspecified: Secondary | ICD-10-CM | POA: Insufficient documentation

## 2013-11-14 DIAGNOSIS — Z880 Allergy status to penicillin: Secondary | ICD-10-CM | POA: Insufficient documentation

## 2013-11-14 MED ORDER — HYDROCODONE-ACETAMINOPHEN 5-325 MG PO TABS
1.0000 | ORAL_TABLET | Freq: Once | ORAL | Status: AC
  Administered 2013-11-14: 1 via ORAL
  Filled 2013-11-14: qty 1

## 2013-11-14 MED ORDER — HYDROCODONE-ACETAMINOPHEN 5-325 MG PO TABS: ORAL_TABLET | ORAL | Status: DC

## 2013-11-14 MED ORDER — CLINDAMYCIN HCL 300 MG PO CAPS
300.0000 mg | ORAL_CAPSULE | Freq: Three times a day (TID) | ORAL | Status: DC
Start: 2013-11-14 — End: 2014-03-04

## 2013-11-14 MED ORDER — CLINDAMYCIN HCL 150 MG PO CAPS
300.0000 mg | ORAL_CAPSULE | Freq: Once | ORAL | Status: AC
  Administered 2013-11-14: 300 mg via ORAL
  Filled 2013-11-14: qty 2

## 2013-11-14 NOTE — ED Notes (Signed)
Verbalized understanding of no driving within 4 hours of taking pain meds due to meds cause drowsiness

## 2013-11-14 NOTE — ED Notes (Signed)
Pt c/o right sided dental pain since yesterday afternoon. States "I don't know if I have an abscess or not, theres a bump and my whole right side of my mouth hurts."

## 2013-11-14 NOTE — ED Provider Notes (Signed)
CSN: [id number]     Arrival date & time 11/14/13  1922 History   First MD Initiated Contact with Patient 11/14/13 2002     Chief Complaint  Patient presents with  . Dental Pain     (Consider location/radiation/quality/duration/timing/severity/associated sxs/prior Treatment) Patient is a 28 y.o. female presenting with tooth pain.  Dental Pain Associated symptoms: no congestion, no facial swelling, no fever, no headaches and no neck pain    Diane Norton is a 28 y.o. female who presents to the Emergency Department complaining of dental pain for one day. States that she has tenderness in along the right lower second molar. She states that the tooth is decayed and she is also noticed a "bump" along the right lower jaw. She reports pain radiating to her right ear. She's used over-the-counter medications in [place] without relief. She denies fever, chills, difficulty swallowing or breathing.   Past Medical History  Diagnosis Date  . Asthma    Past Surgical History  Procedure Laterality Date  . Cholecystectomy     No family history on file. History  Substance Use Topics  . Smoking status: Current Every Day Smoker -- 1.00 packs/day  . Smokeless tobacco: Not on file  . Alcohol Use: No   OB History   Grav Para Term Preterm Abortions TAB SAB Ect Mult Living                 Review of Systems  Constitutional: Negative for fever and appetite change.  HENT: Positive for dental problem. Negative for congestion, facial swelling, sore throat and trouble swallowing.   Eyes: Negative for pain and visual disturbance.  Musculoskeletal: Negative for neck pain and neck stiffness.  Neurological: Negative for dizziness, facial asymmetry and headaches.  Hematological: Negative for adenopathy.  All other systems reviewed and are negative.     Allergies  Darvocet; Penicillins; Bactrim; and Ciprofloxacin  Home Medications   Prior to Admission medications   Not on File   BP 107/54  Pulse 71   Temp(Src) 98.9 F (37.2 C) (Oral)  Resp 16  Ht  (1.626 m)  Wt 135 lb (61.236 kg)  BMI 23.16 kg/m2  SpO2 100%  LMP 10/30/2013 Physical Exam  Nursing note and vitals reviewed. Constitutional: She is oriented to person, place, and time. She appears well-developed and well-nourished. No distress.  HENT:  Head: Normocephalic and atraumatic.  Right Ear: Tympanic membrane and ear canal normal.  Left Ear: Tympanic membrane and ear canal normal.  Mouth/Throat: Uvula is midline, oropharynx is clear and moist and mucous membranes are normal. No trismus in the jaw. Dental caries present. No dental abscesses or uvula swelling.  Dental caries with ttp of the right lower second molar and surrounding gums.  No facial swelling, obvious dental abscess, trismus, or sublingual abnml.    Neck: Normal range of motion. Neck supple.  Cardiovascular: Normal rate, regular rhythm, normal heart sounds and intact distal pulses.   No murmur heard. Pulmonary/Chest: Effort normal and breath sounds normal. No respiratory distress.  Musculoskeletal: Normal range of motion.  Lymphadenopathy:    She has no cervical adenopathy.  Neurological: She is alert and oriented to person, place, and time. She exhibits normal muscle tone. Coordination normal.  Skin: Skin is warm and dry.    ED Course  Procedures (including critical care time) Labs Review Labs Reviewed - No data to display  Imaging Review No results found.   EKG Interpretation None      MDM  Final diagnoses:  None    Patient is well appearing. Nontoxic. No concerning symptoms for infection to the floor of the mouth or the structures of the neck. Patient agrees to followup with her dentist next week. Referral information given. As well as prescription for clindamycin and #10 Vicodin    Lindley Hiney L. Trisha Mangle, PA-C 11/14/13 2035

## 2013-11-14 NOTE — Discharge Instructions (Signed)

## 2013-11-15 NOTE — ED Provider Notes (Signed)
Medical screening examination/treatment/procedure(s) were performed by non-physician practitioner and as supervising physician I was immediately available for consultation/collaboration.   EKG Interpretation None        Cullen Lahaie L Bellina Tokarczyk, MD 11/15/13 2337 

## 2013-12-31 ENCOUNTER — Encounter (HOSPITAL_COMMUNITY): Payer: Self-pay | Admitting: Emergency Medicine

## 2013-12-31 ENCOUNTER — Emergency Department (HOSPITAL_COMMUNITY)
Admission: EM | Admit: 2013-12-31 | Discharge: 2013-12-31 | Disposition: A | Discharge status: Home / Self Care | Payer: Self-pay | Attending: Emergency Medicine | Admitting: Emergency Medicine

## 2013-12-31 DIAGNOSIS — K0889 Other specified disorders of teeth and supporting structures: Secondary | ICD-10-CM

## 2013-12-31 DIAGNOSIS — Z880 Allergy status to penicillin: Secondary | ICD-10-CM | POA: Insufficient documentation

## 2013-12-31 DIAGNOSIS — K029 Dental caries, unspecified: Secondary | ICD-10-CM | POA: Insufficient documentation

## 2013-12-31 DIAGNOSIS — Z720 Tobacco use: Secondary | ICD-10-CM | POA: Insufficient documentation

## 2013-12-31 DIAGNOSIS — J45909 Unspecified asthma, uncomplicated: Secondary | ICD-10-CM | POA: Insufficient documentation

## 2013-12-31 DIAGNOSIS — K088 Other specified disorders of teeth and supporting structures: Secondary | ICD-10-CM | POA: Insufficient documentation

## 2013-12-31 MED ORDER — TRAMADOL HCL 50 MG PO TABS
50.0000 mg | ORAL_TABLET | Freq: Once | ORAL | Status: AC
  Administered 2013-12-31: 50 mg via ORAL
  Filled 2013-12-31: qty 1

## 2013-12-31 MED ORDER — CLINDAMYCIN HCL 300 MG PO CAPS
300.0000 mg | ORAL_CAPSULE | Freq: Four times a day (QID) | ORAL | Status: DC
Start: 2013-12-31 — End: 2014-03-04

## 2013-12-31 MED ORDER — CLINDAMYCIN HCL 150 MG PO CAPS
300.0000 mg | ORAL_CAPSULE | Freq: Once | ORAL | Status: AC
  Administered 2013-12-31: 300 mg via ORAL
  Filled 2013-12-31: qty 2

## 2013-12-31 MED ORDER — TRAMADOL HCL 50 MG PO TABS: 50.0000 mg | ORAL_TABLET | Freq: Four times a day (QID) | ORAL | Status: DC | PRN

## 2013-12-31 NOTE — Discharge Instructions (Signed)
Dental Pain  Toothache is pain in or around a tooth. It may get worse with chewing or with cold or heat.   HOME CARE  · Your dentist may use a numbing medicine during treatment. If so, you may need to avoid eating until the medicine wears off. Ask your dentist about this.  · Only take medicine as told by your dentist or doctor.  · Avoid chewing food near the painful tooth until after all treatment is done. Ask your dentist about this.  GET HELP RIGHT AWAY IF:   · The problem gets worse or new problems appear.  · You have a fever.  · There is redness and puffiness (swelling) of the face, jaw, or neck.  · You cannot open your mouth.  · There is pain in the jaw.  · There is very bad pain that is not helped by medicine.  MAKE SURE YOU:   · Understand these instructions.  · Will watch your condition.  · Will get help right away if you are not doing well or get worse.  Document Released: 08/08/2007 Document Revised: 05/14/2011 Document Reviewed: 08/08/2007  ExitCare® Patient Information ©2015 [employer]. This information is not intended to replace advice given to you by your health care provider. Make sure you discuss any questions you have with your health care provider.

## 2013-12-31 NOTE — ED Provider Notes (Signed)
CSN: [id number]     Arrival date & time 12/31/13  [id number] History   First MD Initiated Contact with Patient 12/31/13 1924     Chief Complaint  Patient presents with  . Dental Pain     (Consider location/radiation/quality/duration/timing/severity/associated sxs/prior Treatment) HPI  Diane Norton is a 28 y.o. female who presents to the Emergency Department complaining of dental pain for 2 days.  She reports history of the same, but noticed swelling of her right jaw for one day.  She states that she has been taking Tylenol and ibuprofen without relief. She states she does not have the funds to follow-up with a dentist. She denies any difficulty swallowing or breathing, fever, neck pain or stiffness or vomiting   Past Medical History  Diagnosis Date  . Asthma    Past Surgical History  Procedure Laterality Date  . Cholecystectomy     History reviewed. No pertinent family history. History  Substance Use Topics  . Smoking status: Current Every Day Smoker -- 1.00 packs/day    Types: Cigarettes  . Smokeless tobacco: Not on file  . Alcohol Use: No   OB History   Grav Para Term Preterm Abortions TAB SAB Ect Mult Living                 Review of Systems  Constitutional: Negative for fever and appetite change.  HENT: Positive for dental problem. Negative for congestion, facial swelling, sore throat and trouble swallowing.   Eyes: Negative for pain and visual disturbance.  Musculoskeletal: Negative for neck pain and neck stiffness.  Neurological: Negative for dizziness, facial asymmetry and headaches.  Hematological: Negative for adenopathy.  All other systems reviewed and are negative.     Allergies  Darvocet; Penicillins; Bactrim; and Ciprofloxacin  Home Medications   Prior to Admission medications   Medication Sig Start Date End Date Taking? Authorizing Provider  clindamycin (CLEOCIN) 300 MG capsule Take 1 capsule (300 mg total) by mouth 3 (three) times daily. For 10 days  11/14/13   Arney Mayabb L. Devin Foskey, PA-C  HYDROcodone-acetaminophen (NORCO/VICODIN) 5-325 MG per tablet Take one-two tabs po q 4-6 hrs prn pain 11/14/13   Camera Krienke L. Mykal Kirchman, PA-C   BP 113/47  Pulse 65  Temp(Src) 97.8 F (36.6 C) (Oral)  Resp 18  Ht 5\' 4"  (1.626 m)  Wt 135 lb (61.236 kg)  BMI 23.16 kg/m2  SpO2 100%  LMP 12/24/2013 Physical Exam  Nursing note and vitals reviewed. Constitutional: She is oriented to person, place, and time. She appears well-developed and well-nourished. No distress.  HENT:  Head: Normocephalic and atraumatic.  Right Ear: Tympanic membrane and ear canal normal.  Left Ear: Tympanic membrane and ear canal normal.  Mouth/Throat: Uvula is midline, oropharynx is clear and moist and mucous membranes are normal. No trismus in the jaw. Dental caries present. No dental abscesses or uvula swelling.    Tenderness to palpation with dental caries of the right lower premolars and molars. Multiple dental caries. Localized edema of the mid right jaw,  no fluctuance of the gums, trismus, or sublingual abnml.    Neck: Normal range of motion. Neck supple.  Cardiovascular: Normal rate, regular rhythm and normal heart sounds.   No murmur heard. Pulmonary/Chest: Effort normal and breath sounds normal. No respiratory distress.  Musculoskeletal: Normal range of motion.  Lymphadenopathy:    She has no cervical adenopathy.  Neurological: She is alert and oriented to person, place, and time. She exhibits normal muscle tone. Coordination normal.  Skin:  Skin is warm and dry.    ED Course  Procedures (including critical care time) Labs Review Labs Reviewed - No data to display  Imaging Review No results found.   EKG Interpretation None      MDM   Final diagnoses:  Pain, dental   Patient was seen here last month for dental pain as well.   Vital signs are stable. Patient is well-appearing. No concerning symptoms for infection to the floor of the mouth or deep structures  of the neck. Patient agrees to prescription for clindamycin and ultram. I advised her that she will need to make arrangements for a dental follow-up. No drainable abscess noted at this time, she agrees to return here if sx's worsen.   Khalifa Knecht L. Trisha Mangleriplett, PA-C 01/02/14 2140

## 2013-12-31 NOTE — ED Notes (Signed)
Rt side dental pain for 2 days.

## 2014-01-02 NOTE — ED Provider Notes (Signed)
Medical screening examination/treatment/procedure(s) were performed by non-physician practitioner and as supervising physician I was immediately available for consultation/collaboration.   EKG Interpretation None        Benny LennertJoseph L Oluwatomiwa Kinyon, MD 01/02/14 2251

## 2014-02-14 ENCOUNTER — Encounter (HOSPITAL_COMMUNITY): Payer: Self-pay | Admitting: *Deleted

## 2014-02-14 DIAGNOSIS — Z720 Tobacco use: Secondary | ICD-10-CM | POA: Insufficient documentation

## 2014-02-14 DIAGNOSIS — Z880 Allergy status to penicillin: Secondary | ICD-10-CM | POA: Insufficient documentation

## 2014-02-14 DIAGNOSIS — J45901 Unspecified asthma with (acute) exacerbation: Secondary | ICD-10-CM | POA: Insufficient documentation

## 2014-02-14 DIAGNOSIS — L509 Urticaria, unspecified: Secondary | ICD-10-CM | POA: Insufficient documentation

## 2014-02-14 DIAGNOSIS — M545 Low back pain: Secondary | ICD-10-CM | POA: Insufficient documentation

## 2014-02-14 DIAGNOSIS — Z792 Long term (current) use of antibiotics: Secondary | ICD-10-CM | POA: Insufficient documentation

## 2014-02-14 NOTE — ED Notes (Signed)
Pt c/o rash all on her chest, back, and arms. Rash started 2 days ago. Pt has not taking any meds.

## 2014-02-15 ENCOUNTER — Emergency Department (HOSPITAL_COMMUNITY)
Admission: EM | Admit: 2014-02-15 | Discharge: 2014-02-15 | Disposition: A | Discharge status: Home / Self Care | Payer: Self-pay | Attending: Emergency Medicine | Admitting: Emergency Medicine

## 2014-02-15 DIAGNOSIS — L509 Urticaria, unspecified: Secondary | ICD-10-CM

## 2014-02-15 DIAGNOSIS — M545 Low back pain: Secondary | ICD-10-CM

## 2014-02-15 MED ORDER — FAMOTIDINE 20 MG PO TABS
20.0000 mg | ORAL_TABLET | Freq: Once | ORAL | Status: AC
  Administered 2014-02-15: 20 mg via ORAL
  Filled 2014-02-15: qty 1

## 2014-02-15 MED ORDER — PREDNISONE 50 MG PO TABS
60.0000 mg | ORAL_TABLET | Freq: Once | ORAL | Status: AC
  Administered 2014-02-15: 60 mg via ORAL
  Filled 2014-02-15 (×2): qty 1

## 2014-02-15 MED ORDER — PROMETHAZINE HCL 12.5 MG PO TABS
12.5000 mg | ORAL_TABLET | Freq: Once | ORAL | Status: AC
  Administered 2014-02-15: 12.5 mg via ORAL
  Filled 2014-02-15: qty 1

## 2014-02-15 MED ORDER — HYDROXYZINE HCL 25 MG PO TABS
50.0000 mg | ORAL_TABLET | Freq: Once | ORAL | Status: AC
  Administered 2014-02-15: 50 mg via ORAL
  Filled 2014-02-15: qty 2

## 2014-02-15 MED ORDER — HYDROCODONE-ACETAMINOPHEN 5-325 MG PO TABS
2.0000 | ORAL_TABLET | Freq: Once | ORAL | Status: AC
  Administered 2014-02-15: 2 via ORAL
  Filled 2014-02-15: qty 2

## 2014-02-15 MED ORDER — HYDROCODONE-ACETAMINOPHEN 5-325 MG PO TABS: 1.0000 | ORAL_TABLET | ORAL | Status: DC | PRN

## 2014-02-15 MED ORDER — HYDROXYZINE HCL 25 MG PO TABS: ORAL_TABLET | ORAL | Status: DC

## 2014-02-15 NOTE — Discharge Instructions (Signed)
Please keep a journal of any changes in your foods. Please journal the different medications that she will using. Please journal how much rest your getting each night, and if anything seems to be aggravating anxiety. Please taper. To show this to the allergy specialist listed above, or the allergy specialist of your choice for additional testing. Please use Vistaril, 1 or 2 tablets, every 6 hours as needed for itching and hives. This medication may cause drowsiness, please use with caution. Please use Pepcid 20 mg daily. Use Tylenol or ibuprofen for mild pain of your back, use Norco for more severe pain of your back. Norco may also cause drowsiness, please use with caution. Please see your primary physician, or return to the emergency department if any facial swelling, swelling of the mouth, changes in ability to swallow or breathe. Hives Hives are itchy, red, puffy (swollen) areas of the skin. Hives can change in size and location on your body. Hives can come and go for hours, days, or weeks. Hives do not spread from person to person (noncontagious). Scratching, exercise, and stress can make your hives worse. HOME CARE  Avoid things that cause your hives (triggers).  Take antihistamine medicines as told by your doctor. Do not drive while taking an antihistamine.  Take any other medicines for itching as told by your doctor.  Wear loose-fitting clothing.  Keep all doctor visits as told. GET HELP RIGHT AWAY IF:   You have a fever.  Your tongue or lips are puffy.  You have trouble breathing or swallowing.  You feel tightness in the throat or chest.  You have belly (abdominal) pain.  You have lasting or severe itching that is not helped by medicine.  You have painful or puffy joints. These problems may be the first sign of a life-threatening allergic reaction. Call your local emergency services (911 in [place] MAKE SURE YOU:   Understand these instructions.  Will watch your  condition.  Will get help right away if you are not doing well or get worse. Document Released: 11/29/2007 Document Revised: 08/21/2011 Document Reviewed: 05/15/2011 [facility] Patient Information 2015 [employer], [place]. This information is not intended to replace advice given to you by your health care provider. Make sure you discuss any questions you have with your health care provider.

## 2014-02-15 NOTE — ED Provider Notes (Signed)
CSN: [id number]     Arrival date & time 02/14/14  2302 History   First MD Initiated Contact with Patient 02/15/14 0035     Chief Complaint  Patient presents with  . Rash     (Consider location/radiation/quality/duration/timing/severity/associated sxs/prior Treatment) HPI Comments: Patient is a 28 year old female who presents to the emergency department with complaint of rash all over her body. The patient states that 2 days ago she was awakened by a problem with itching on her arms. Over the last 24-48 hours this has turned to hives and has been moving it to various parts of her body from time to time. The patient states that she has not changed anything. She is not using any new foods, has not been taking any new medicines, has not inhaled any chemicals of any kind, and has not been in any new environments. She states she has been around others who've had viral illnesses, but she has not had symptoms herself.  The history is provided by the patient.    Past Medical History  Diagnosis Date  . Asthma    Past Surgical History  Procedure Laterality Date  . Cholecystectomy     History reviewed. No pertinent family history. History  Substance Use Topics  . Smoking status: Current Every Day Smoker -- 1.00 packs/day    Types: Cigarettes  . Smokeless tobacco: Not on file  . Alcohol Use: No   OB History    No data available     Review of Systems  Constitutional: Negative for activity change.       All ROS Neg except as noted in HPI  Eyes: Negative for photophobia and discharge.  Respiratory: Positive for wheezing. Negative for cough and shortness of breath.   Cardiovascular: Negative for chest pain and palpitations.  Gastrointestinal: Negative for abdominal pain and blood in stool.  Genitourinary: Negative for dysuria, frequency and hematuria.  Musculoskeletal: Negative for back pain, arthralgias and neck pain.  Skin: Positive for rash.  Neurological: Negative for dizziness,  seizures and speech difficulty.  Psychiatric/Behavioral: Negative for hallucinations and confusion.      Allergies  Darvocet; Penicillins; Bactrim; and Ciprofloxacin  Home Medications   Prior to Admission medications   Medication Sig Start Date End Date Taking? Authorizing Provider  clindamycin (CLEOCIN) 300 MG capsule Take 1 capsule (300 mg total) by mouth 3 (three) times daily. For 10 days 11/14/13   Tammy L. Triplett, PA-C  clindamycin (CLEOCIN) 300 MG capsule Take 1 capsule (300 mg total) by mouth 4 (four) times daily. For 10 days 12/31/13   Tammy L. Triplett, PA-C  HYDROcodone-acetaminophen (NORCO/VICODIN) 5-325 MG per tablet Take one-two tabs po q 4-6 hrs prn pain 11/14/13   Tammy L. Triplett, PA-C  traMADol (ULTRAM) 50 MG tablet Take 1 tablet (50 mg total) by mouth every 6 (six) hours as needed. 12/31/13   Tammy L. Triplett, PA-C   BP 123/65 mmHg  Pulse 94  Temp(Src) 98.3 F (36.8 C) (Oral)  Resp 18  Ht 5\' 4"  (1.626 m)  Wt 135 lb (61.236 kg)  BMI 23.16 kg/m2  SpO2 100%  LMP 01/19/2014 Physical Exam  Constitutional: She is oriented to person, place, and time. She appears well-developed and well-nourished.  Non-toxic appearance.  HENT:  Head: Normocephalic.  Right Ear: Tympanic membrane and external ear normal.  Left Ear: Tympanic membrane and external ear normal.  Eyes: EOM and lids are normal. Pupils are equal, round, and reactive to light.  Neck: Normal range of motion. Neck  supple. Carotid bruit is not present.  Cardiovascular: Normal rate, regular rhythm, normal heart sounds, intact distal pulses and normal pulses.   Pulmonary/Chest: Breath sounds normal. No respiratory distress. She has no wheezes.  Patient speaks in complete sentences without problem.  There is symmetrical rise and fall of the chest. No use of a sensory muscles noted.  Abdominal: Soft. Bowel sounds are normal. There is no tenderness. There is no guarding.  Musculoskeletal: Normal range of motion.   Lymphadenopathy:       Head (right side): No submandibular adenopathy present.       Head (left side): No submandibular adenopathy present.    She has no cervical adenopathy.  Neurological: She is alert and oriented to person, place, and time. She has normal strength. No cranial nerve deficit or sensory deficit.  Skin: Skin is warm and dry.  . Stages of hives noted on the neck, chest, back, upper and lower extremities.  Psychiatric: She has a normal mood and affect. Her speech is normal.  Nursing note and vitals reviewed.   ED Course  Procedures (including critical care time) Labs Review Labs Reviewed - No data to display  Imaging Review No results found.   EKG Interpretation None      MDM  Vital signs are within normal limits. Patient speaks in complete sentences. The examination support hives of multiple stages at multiple locations. Patient received some improvement after Vistaril given. The patient will be treated with Vistaril and Pepcid. Patient is to have allergy testing by allergy specialist. She is advised to return immediately to the emergency department if any difficulty with breathing, swelling of the mouth or face, or deterioration in her condition.    Final diagnoses:  Hives  Low back pain, unspecified back pain laterality, with sciatica presence unspecified    **I have reviewed nursing notes, vital signs, and all appropriate lab and imaging results for this patient.    Kathie DikeHobson M Dailey Alberson, PA-C 02/16/14 1533  Dione Boozeavid Glick, MD 02/19/14 [id number]

## 2014-03-03 ENCOUNTER — Emergency Department (HOSPITAL_COMMUNITY)
Admission: EM | Admit: 2014-03-03 | Discharge: 2014-03-04 | Disposition: A | Discharge status: Home / Self Care | Payer: Self-pay | Attending: Emergency Medicine | Admitting: Emergency Medicine

## 2014-03-03 DIAGNOSIS — Z720 Tobacco use: Secondary | ICD-10-CM | POA: Insufficient documentation

## 2014-03-03 DIAGNOSIS — J45909 Unspecified asthma, uncomplicated: Secondary | ICD-10-CM | POA: Insufficient documentation

## 2014-03-03 DIAGNOSIS — Z880 Allergy status to penicillin: Secondary | ICD-10-CM | POA: Insufficient documentation

## 2014-03-03 DIAGNOSIS — K0889 Other specified disorders of teeth and supporting structures: Secondary | ICD-10-CM

## 2014-03-03 DIAGNOSIS — K088 Other specified disorders of teeth and supporting structures: Secondary | ICD-10-CM | POA: Insufficient documentation

## 2014-03-04 ENCOUNTER — Encounter (HOSPITAL_COMMUNITY): Payer: Self-pay

## 2014-03-04 MED ORDER — OXYCODONE-ACETAMINOPHEN 5-325 MG PO TABS
1.0000 | ORAL_TABLET | Freq: Once | ORAL | Status: AC
  Administered 2014-03-04: 1 via ORAL
  Filled 2014-03-04: qty 1

## 2014-03-04 MED ORDER — CLINDAMYCIN HCL 300 MG PO CAPS: 300.0000 mg | ORAL_CAPSULE | Freq: Four times a day (QID) | ORAL | Status: DC

## 2014-03-04 NOTE — ED Provider Notes (Signed)
CSN: [id number]     Arrival date & time 03/03/14  2349 History  This chart was scribed for Joya Gaskinsonald W Riya Huxford, MD by Haywood PaoNadim Abu Hashem, ED Scribe. The patient was seen in [facility]19/[facility]19 and the patient's care was started at 12:04 AM.  Chief Complaint  Patient presents with  . Dental Pain   Patient is a 28 y.o. female presenting with tooth pain. The history is provided by the patient. No language interpreter was used.  Dental Pain Location:  Generalized Quality:  Sharp Severity:  Mild Onset quality:  Sudden Duration:  1 day Chronicity:  New Context: dental fracture   Associated symptoms: no fever     HPI Comments: Diane Norton is a 28 y.o. female who presents to the Emergency Department complaining of dental pain, onset last night. Pt states she has a broken tooth. She has not followed up with a dentist. She denies fever and vomiting.  She also complains of a cough.  Past Medical History  Diagnosis Date  . Asthma    Past Surgical History  Procedure Laterality Date  . Cholecystectomy     No family history on file. History  Substance Use Topics  . Smoking status: Current Every Day Smoker -- 1.00 packs/day    Types: Cigarettes  . Smokeless tobacco: Not on file  . Alcohol Use: No   OB History    No data available     Review of Systems  Constitutional: Negative for fever.  HENT: Positive for dental problem.   Respiratory: Positive for cough.   Gastrointestinal: Negative for vomiting.   Allergies  Darvocet; Penicillins; Bactrim; and Ciprofloxacin  Home Medications   Prior to Admission medications   Medication Sig Start Date End Date Taking? Authorizing Provider  clindamycin (CLEOCIN) 300 MG capsule Take 1 capsule (300 mg total) by mouth 4 (four) times daily. X 7 days 03/04/14   Joya Gaskinsonald W Rage Beever, MD   BP 129/86 mmHg  Pulse 101  Temp(Src) 98.2 F (36.8 C) (Oral)  Resp 20  Ht 5\' 4"  (1.626 m)  Wt 135 lb (61.236 kg)  BMI 23.16 kg/m2  SpO2 100%  LMP  01/19/2014 Physical Exam  CONSTITUTIONAL: Well developed/well nourished HEAD AND FACE: Normocephalic/atraumatic EYES: EOMI/PERRL ENMT: Mucous membranes moist.  Poor dentition.  No trismus.  No focal abscess noted. NECK: supple no meningeal signs CV: S1/S2 noted, no murmurs/rubs/gallops noted LUNGS: Lungs are clear to auscultation bilaterally, no apparent distress ABDOMEN: soft, nontender, no rebound or guarding NEURO: Pt is awake/alert, moves all extremitiesx4 EXTREMITIES:full ROM SKIN: warm, color normal  ED Course  Procedures  DIAGNOSTIC STUDIES: Oxygen Saturation is 100% on room air, normal by my interpretation.    COORDINATION OF CARE: 12:05 AM Discussed treatment plan with pt at bedside and pt agreed to plan.  Advised need for dental followup   Medications  oxyCODONE-acetaminophen (PERCOCET/ROXICET) 5-325 MG per tablet 1 tablet (1 tablet Oral Given 03/04/14 0014)     MDM   Final diagnoses:  Pain, dental   Nursing notes including past medical history and social history reviewed and considered in documentation Previous records reviewed and considered   I personally performed the services described in this documentation, which was scribed in my presence. The recorded information has been reviewed and is accurate.         Joya Gaskinsonald W Yisroel Mullendore, MD 03/04/14 [phone]

## 2014-03-04 NOTE — ED Notes (Signed)
Pain in right lower teeth since last night

## 2014-03-04 NOTE — Discharge Instructions (Signed)

## 2014-07-22 ENCOUNTER — Emergency Department (HOSPITAL_COMMUNITY): Payer: Self-pay

## 2014-07-22 ENCOUNTER — Encounter (HOSPITAL_COMMUNITY): Payer: Self-pay

## 2014-07-22 ENCOUNTER — Emergency Department (HOSPITAL_COMMUNITY)
Admission: EM | Admit: 2014-07-22 | Discharge: 2014-07-22 | Disposition: A | Discharge status: Home / Self Care | Payer: Self-pay | Attending: Emergency Medicine | Admitting: Emergency Medicine

## 2014-07-22 DIAGNOSIS — W109XXA Fall (on) (from) unspecified stairs and steps, initial encounter: Secondary | ICD-10-CM | POA: Insufficient documentation

## 2014-07-22 DIAGNOSIS — Z792 Long term (current) use of antibiotics: Secondary | ICD-10-CM | POA: Insufficient documentation

## 2014-07-22 DIAGNOSIS — Z880 Allergy status to penicillin: Secondary | ICD-10-CM | POA: Insufficient documentation

## 2014-07-22 DIAGNOSIS — Z720 Tobacco use: Secondary | ICD-10-CM | POA: Insufficient documentation

## 2014-07-22 DIAGNOSIS — J45909 Unspecified asthma, uncomplicated: Secondary | ICD-10-CM | POA: Insufficient documentation

## 2014-07-22 DIAGNOSIS — S50312A Abrasion of left elbow, initial encounter: Secondary | ICD-10-CM | POA: Insufficient documentation

## 2014-07-22 DIAGNOSIS — Y998 Other external cause status: Secondary | ICD-10-CM | POA: Insufficient documentation

## 2014-07-22 DIAGNOSIS — S022XXA Fracture of nasal bones, initial encounter for closed fracture: Secondary | ICD-10-CM | POA: Insufficient documentation

## 2014-07-22 DIAGNOSIS — Y9389 Activity, other specified: Secondary | ICD-10-CM | POA: Insufficient documentation

## 2014-07-22 DIAGNOSIS — Y9289 Other specified places as the place of occurrence of the external cause: Secondary | ICD-10-CM | POA: Insufficient documentation

## 2014-07-22 MED ORDER — OXYCODONE-ACETAMINOPHEN 5-325 MG PO TABS: 1.0000 | ORAL_TABLET | Freq: Four times a day (QID) | ORAL | Status: DC | PRN

## 2014-07-22 MED ORDER — OXYCODONE-ACETAMINOPHEN 5-325 MG PO TABS
1.0000 | ORAL_TABLET | Freq: Once | ORAL | Status: AC
  Administered 2014-07-22: 1 via ORAL
  Filled 2014-07-22: qty 1

## 2014-07-22 NOTE — ED Provider Notes (Signed)
CSN: [id number]     Arrival date & time 07/22/14  0032 History  This chart was scribed for Diane Batonourtney F Horton, MD by Abel PrestoKara Demonbreun, ED Scribe. This patient was seen in room APA06/APA06 and the patient's care was started at 12:52 AM.    Chief Complaint  Patient presents with  . Fall  . Facial Injury    Patient is a 29 y.o. female presenting with facial injury. The history is provided by the patient. No language interpreter was used.  Facial Injury Associated symptoms: epistaxis    HPI Comments: Diane Norton is a 29 y.o. female who presents to the Emergency Department complaining of fall just PTA. Pt states she was walking into residence and got her foot caught on a rug causing her to fall up a stairwell and hit her face on the stairs. Pt notes associated mild left elbow pain and 10/10 pain to nose with some swelling and epistaxis from right nare. Pt is not on any blood thinners. Pt denies LOC, loose teeth or malocclusion.   Past Medical History  Diagnosis Date  . Asthma    Past Surgical History  Procedure Laterality Date  . Cholecystectomy     No family history on file. History  Substance Use Topics  . Smoking status: Current Every Day Smoker -- 1.00 packs/day    Types: Cigarettes  . Smokeless tobacco: Not on file  . Alcohol Use: No   OB History    No data available     Review of Systems  HENT: Positive for nosebleeds.        Nose pain  Respiratory: Negative for chest tightness and shortness of breath.   Cardiovascular: Negative for chest pain.  Skin: Positive for color change and wound.  Neurological: Negative for dizziness, weakness and light-headedness.  All other systems reviewed and are negative.     Allergies  Darvocet; Penicillins; Bactrim; and Ciprofloxacin  Home Medications   Prior to Admission medications   Medication Sig Start Date End Date Taking? Authorizing Provider  aspirin-acetaminophen-caffeine (EXCEDRIN MIGRAINE) [phone] MG per tablet Take 1  tablet by mouth every 6 (six) hours as needed for headache.    Historical Provider, MD  clindamycin (CLEOCIN) 300 MG capsule Take 1 capsule (300 mg total) by mouth 4 (four) times daily. X 7 days 03/04/14   Zadie Rhineonald Wickline, MD  diphenhydrAMINE (SOMINEX) 25 MG tablet Take 25 mg by mouth at bedtime as needed for sleep.    Historical Provider, MD  oxyCODONE-acetaminophen (PERCOCET/ROXICET) 5-325 MG per tablet Take 1-2 tablets by mouth every 6 (six) hours as needed for severe pain. 07/22/14   Diane Batonourtney F Horton, MD   BP 107/60 mmHg  Pulse 75  Temp(Src) 98.2 F (36.8 C) (Oral)  Resp 18  Ht 5\' 4"  (1.626 m)  Wt 140 lb (63.504 kg)  BMI 24.02 kg/m2  SpO2 100%  LMP 07/16/2014 Physical Exam  Constitutional: She is oriented to person, place, and time. No distress.  HENT:  Head: Normocephalic.  Right Ear: External ear normal.  Left Ear: External ear normal.  Mouth/Throat: Oropharynx is clear and moist.  Diffuse swelling over the nasal bridge, scant blood in the bilateral nares, no evidence of septal hematoma, dentition intact  Eyes: EOM are normal. Pupils are equal, round, and reactive to light.  No evidence of raccoon eyes or battle signs  Cardiovascular: Normal rate, regular rhythm and normal heart sounds.   No murmur heard. Pulmonary/Chest: Effort normal and breath sounds normal. No respiratory distress. She  has no wheezes.  Abdominal: Soft. She exhibits no distension.  Musculoskeletal:  Normal range of motion of the left elbow, no obvious deformities  Neurological: She is alert and oriented to person, place, and time.  Skin: Skin is warm and dry.  Superficial abrasion to the left elbow  Psychiatric: She has a normal mood and affect.  Nursing note and vitals reviewed.   ED Course  Procedures (including critical care time) DIAGNOSTIC STUDIES: Oxygen Saturation is 99% on room air, normal by my interpretation.    COORDINATION OF CARE: 12:56 AM Discussed treatment plan with patient at  beside, the patient agrees with the plan and has no further questions at this time.   Labs Review Labs Reviewed - No data to display  Imaging Review Ct Maxillofacial Wo Cm  07/22/2014   CLINICAL DATA:  Tripped over piece of carpet and fell, with pain at the nose. Initial encounter.  EXAM: CT MAXILLOFACIAL WITHOUT CONTRAST  TECHNIQUE: Multidetector CT imaging of the maxillofacial structures was performed. Multiplanar CT image reconstructions were also generated. A small metallic BB was placed on the right temple in order to reliably differentiate right from left.  COMPARISON:  Maxillofacial CT performed 02/06/2008  FINDINGS: There is a displaced fracture involving both sides of the nasal bone, comminuted in appearance, with leftward displacement. Surrounding soft tissue swelling is noted.  No additional fractures are seen. The maxilla and mandible appear intact. There is partial absence of the left first maxillary premolar and right second mandibular molar. A large dental caries is noted at the left second maxillary molar.  The orbits are intact bilaterally. A mucus retention cyst or polyp is noted at the base of the left maxillary sinus. The remaining visualized paranasal sinuses and mastoid air cells are well-aerated.  The parapharyngeal fat planes are preserved. The nasopharynx, oropharynx and hypopharynx are unremarkable in appearance. The visualized portions of the valleculae and piriform sinuses are grossly unremarkable.  The parotid and submandibular glands are within normal limits. No cervical lymphadenopathy is seen. The visualized portions of the brain are unremarkable in appearance.  IMPRESSION: 1. Displaced fracture involving both sides of the nasal bone, comminuted in appearance, with leftward displacement. Surrounding soft tissue swelling noted. 2. Partial absence of the left first maxillary premolar and right second mandibular molar. Large dental caries at the left second maxillary molar. 3.  Mucus retention cyst or polyp at the base of the left maxillary sinus.   Electronically Signed   By: Roanna Raider M.D.   On: 07/22/2014 02:30     EKG Interpretation None      MDM   Final diagnoses:  Nasal bone fracture, closed, initial encounter   Patient presents following a fall. Obvious swelling and deformity to the nose. Otherwise small abrasion to the left upper lobe. ABCs intact. No other obvious injuries. Patient given Percocet.  Given extent of swelling about the nose, maxilla place CT obtained. Patient has displaced bilateral nasal bone fractures are comminuted. She will need ENT follow-up.  Patient will be given pain medication at discharge and encouraged to use ice and supportive measures at home until ENT follow-up.  After history, exam, and medical workup I feel the patient has been appropriately medically screened and is safe for discharge home. Pertinent diagnoses were discussed with the patient. Patient was given return precautions.  I personally performed the services described in this documentation, which was scribed in my presence. The recorded information has been reviewed and is accurate.  Diane Batonourtney F Horton, MD 07/22/14 [phone]

## 2014-07-22 NOTE — Discharge Instructions (Signed)
You broken your nose. The bones are displaced. Swelling will need to improve before seeing an ENT doctor for possible bone setting.  You should apply ice. Avoid forceful blowing of your nose. He'll be given a short course of pain medication.  Nasal Fracture A nasal fracture is a break or crack in the bones of the nose. A minor break usually heals in a month. You often will receive black eyes from a nasal fracture. This is not a cause for concern. The black eyes will go away over 1 to 2 weeks.  DIAGNOSIS  Your caregiver may want to examine you if you are concerned about a fracture of the nose. X-rays of the nose may not show a nasal fracture even when one is present. Sometimes your caregiver must wait 1 to 5 days after the injury to re-check the nose for alignment and to take additional X-rays. Sometimes the caregiver must wait until the swelling has gone down. TREATMENT Minor fractures that have caused no deformity often do not require treatment. More serious fractures where bones are displaced may require surgery. This will take place after the swelling is gone. Surgery will stabilize and align the fracture. HOME CARE INSTRUCTIONS   Put ice on the injured area.  Put ice in a plastic bag.  Place a towel between your skin and the bag.  Leave the ice on for 15-20 minutes, 03-04 times a day.  Take medications as directed by your caregiver.  Only take over-the-counter or prescription medicines for pain, discomfort, or fever as directed by your caregiver.  If your nose starts bleeding, squeeze the soft parts of the nose against the center wall while you are sitting in an upright position for 10 minutes.  Contact sports should be avoided for at least 3 to 4 weeks or as directed by your caregiver. SEEK MEDICAL CARE IF:  Your pain increases or becomes severe.  You continue to have nosebleeds.  The shape of your nose does not return to normal within 5 days.  You have pus draining from the  nose. SEEK IMMEDIATE MEDICAL CARE IF:   You have bleeding from your nose that does not stop after 20 minutes of pinching the nostrils closed and keeping ice on the nose.  You have clear fluid draining from your nose.  You notice a grape-like swelling on the dividing wall between the nostrils (septum). This is a collection of blood (hematoma) that must be drained to help prevent infection.  You have difficulty moving your eyes.  You have recurrent vomiting. Document Released: 02/17/2000 Document Revised: 05/14/2011 Document Reviewed: 06/05/2010 [facility] Patient Information 2015 [place], [place]. This information is not intended to replace advice given to you by your health care provider. Make sure you discuss any questions you have with your health care provider.

## 2014-07-22 NOTE — ED Notes (Signed)
Pt states she fell coming up some stairs and hit her face on the steps, has pain and swelling to nose

## 2014-09-27 ENCOUNTER — Emergency Department (HOSPITAL_COMMUNITY)
Admission: EM | Admit: 2014-09-27 | Discharge: 2014-09-27 | Disposition: A | Discharge status: Home / Self Care | Payer: Self-pay | Attending: Emergency Medicine | Admitting: Emergency Medicine

## 2014-09-27 ENCOUNTER — Encounter (HOSPITAL_COMMUNITY): Payer: Self-pay | Admitting: *Deleted

## 2014-09-27 DIAGNOSIS — Z720 Tobacco use: Secondary | ICD-10-CM | POA: Insufficient documentation

## 2014-09-27 DIAGNOSIS — Z792 Long term (current) use of antibiotics: Secondary | ICD-10-CM | POA: Insufficient documentation

## 2014-09-27 DIAGNOSIS — K0889 Other specified disorders of teeth and supporting structures: Secondary | ICD-10-CM

## 2014-09-27 DIAGNOSIS — R21 Rash and other nonspecific skin eruption: Secondary | ICD-10-CM | POA: Insufficient documentation

## 2014-09-27 DIAGNOSIS — Z880 Allergy status to penicillin: Secondary | ICD-10-CM | POA: Insufficient documentation

## 2014-09-27 DIAGNOSIS — J45909 Unspecified asthma, uncomplicated: Secondary | ICD-10-CM | POA: Insufficient documentation

## 2014-09-27 DIAGNOSIS — R591 Generalized enlarged lymph nodes: Secondary | ICD-10-CM

## 2014-09-27 DIAGNOSIS — K088 Other specified disorders of teeth and supporting structures: Secondary | ICD-10-CM | POA: Insufficient documentation

## 2014-09-27 DIAGNOSIS — R590 Localized enlarged lymph nodes: Secondary | ICD-10-CM | POA: Insufficient documentation

## 2014-09-27 HISTORY — DX: Unspecified convulsions: R56.9

## 2014-09-27 MED ORDER — CEPHALEXIN 500 MG PO CAPS: 500.0000 mg | ORAL_CAPSULE | Freq: Four times a day (QID) | ORAL | Status: DC

## 2014-09-27 MED ORDER — HYDROCODONE-ACETAMINOPHEN 5-325 MG PO TABS: 2.0000 | ORAL_TABLET | ORAL | Status: DC | PRN

## 2014-09-27 MED ORDER — CEPHALEXIN 500 MG PO CAPS
500.0000 mg | ORAL_CAPSULE | Freq: Once | ORAL | Status: AC
  Administered 2014-09-27: 500 mg via ORAL
  Filled 2014-09-27: qty 1

## 2014-09-27 NOTE — ED Notes (Signed)
Pt given discharge papers 

## 2014-09-27 NOTE — Discharge Instructions (Signed)
Cervical Adenitis You have a swollen lymph gland in your neck. This commonly happens with Strep and virus infections, dental problems, insect bites, and injuries about the face, scalp, or neck. The lymph glands swell as the body fights the infection or heals the injury. Swelling and firmness typically lasts for several weeks after the infection or injury is healed. Rarely lymph glands can become swollen because of cancer or TB. Antibiotics are prescribed if there is evidence of an infection. Sometimes an infected lymph gland becomes filled with pus. This condition may require opening up the abscessed gland by draining it surgically. Most of the time infected glands return to normal within two weeks. Do not poke or squeeze the swollen lymph nodes. That may keep them from shrinking back to their normal size. If the lymph gland is still swollen after 2 weeks, further medical evaluation is needed.  SEEK IMMEDIATE MEDICAL CARE IF:  You have difficulty swallowing or breathing, increased swelling, severe pain, or a high fever.  Document Released: 02/19/2005 Document Revised: 05/14/2011 Document Reviewed: 08/11/2006 [facility] Patient Information 2015 [place], [place]. This information is not intended to replace advice given to you by your health care provider. Make sure you discuss any questions you have with your health care provider.  Dental Pain Toothache is pain in or around a tooth. It may get worse with chewing or with cold or heat.  HOME CARE  Your dentist may use a numbing medicine during treatment. If so, you may need to avoid eating until the medicine wears off. Ask your dentist about this.  Only take medicine as told by your dentist or doctor.  Avoid chewing food near the painful tooth until after all treatment is done. Ask your dentist about this. GET HELP RIGHT AWAY IF:   The problem gets worse or new problems appear.  You have a fever.  There is redness and puffiness (swelling) of the  face, jaw, or neck.  You cannot open your mouth.  There is pain in the jaw.  There is very bad pain that is not helped by medicine. MAKE SURE YOU:   Understand these instructions.  Will watch your condition.  Will get help right away if you are not doing well or get worse. Document Released: 08/08/2007 Document Revised: 05/14/2011 Document Reviewed: 08/08/2007 [facility] Patient Information 2015 [employer], [place]. This information is not intended to replace advice given to you by your health care provider. Make sure you discuss any questions you have with your health care provider.

## 2014-09-27 NOTE — ED Provider Notes (Signed)
CSN: [id number]     Arrival date & time 09/27/14  1121 History   First MD Initiated Contact with Patient 09/27/14 1239     Chief Complaint  Patient presents with  . Rash  . Neck Pain     (Consider location/radiation/quality/duration/timing/severity/associated sxs/prior Treatment) Patient is a 29 y.o. female presenting with rash and neck pain. The history is provided by the patient.  Rash Location:  Torso Torso rash location:  L chest and R chest Quality: itchiness   Severity:  Moderate Timing:  Constant Progression:  Worsening Chronicity:  New Relieved by:  Nothing Worsened by:  Nothing tried Ineffective treatments:  None tried Associated symptoms: no fever and no sore throat   Neck Pain Associated symptoms: no fever   Pt complains of swelling to side of her neck and a toothache  Past Medical History  Diagnosis Date  . Asthma   . Seizures    Past Surgical History  Procedure Laterality Date  . Cholecystectomy     History reviewed. No pertinent family history. History  Substance Use Topics  . Smoking status: Current Every Day Smoker -- 1.00 packs/day    Types: Cigarettes  . Smokeless tobacco: Not on file  . Alcohol Use: No   OB History    No data available     Review of Systems  Constitutional: Negative for fever.  HENT: Negative for sore throat.   Musculoskeletal: Positive for neck pain.  Skin: Positive for rash.  All other systems reviewed and are negative.     Allergies  Darvocet; Penicillins; Bactrim; and Ciprofloxacin  Home Medications   Prior to Admission medications   Medication Sig Start Date End Date Taking? Authorizing Provider  aspirin-acetaminophen-caffeine (EXCEDRIN MIGRAINE) [phone] MG per tablet Take 1 tablet by mouth every 6 (six) hours as needed for headache.    Historical Provider, MD  cephALEXin (KEFLEX) 500 MG capsule Take 1 capsule (500 mg total) by mouth 4 (four) times daily. 09/27/14   Elson Areas, PA-C  clindamycin  (CLEOCIN) 300 MG capsule Take 1 capsule (300 mg total) by mouth 4 (four) times daily. X 7 days 03/04/14   Zadie Rhine, MD  diphenhydrAMINE (SOMINEX) 25 MG tablet Take 25 mg by mouth at bedtime as needed for sleep.    Historical Provider, MD  HYDROcodone-acetaminophen (NORCO/VICODIN) 5-325 MG per tablet Take 2 tablets by mouth every 4 (four) hours as needed. 09/27/14   Elson Areas, PA-C  oxyCODONE-acetaminophen (PERCOCET/ROXICET) 5-325 MG per tablet Take 1-2 tablets by mouth every 6 (six) hours as needed for severe pain. 07/22/14   Shon Baton, MD   BP 98/50 mmHg  Pulse 55  Temp(Src) 98.1 F (36.7 C) (Oral)  Resp 16  Ht 5\' 4"  (1.626 m)  Wt 140 lb (63.504 kg)  BMI 24.02 kg/m2  SpO2 100%  LMP 08/30/2014 Physical Exam  Constitutional: She is oriented to person, place, and time. She appears well-developed.  HENT:  Head: Normocephalic and atraumatic.  Eyes: Conjunctivae are normal. Pupils are equal, round, and reactive to light.  Neck: Normal range of motion.  Cardiovascular: Normal rate.   Pulmonary/Chest: Effort normal.  Musculoskeletal: Normal range of motion.  Lymphadenopathy:    She has cervical adenopathy.  Neurological: She is alert and oriented to person, place, and time. She has normal reflexes.  Skin: Skin is warm.  Psychiatric: She has a normal mood and affect.  Nursing note and vitals reviewed.   ED Course  Procedures (including critical care time) Labs Review  Labs Reviewed - No data to display  Imaging Review No results found.   EKG Interpretation None      MDM   Final diagnoses:  Toothache  Lymphadenopathy    Keflex Hydrocodone avs    Elson Areas, PA-C 09/27/14 1634  Bethann Berkshire, MD 09/28/14 [phone]

## 2014-09-27 NOTE — ED Notes (Signed)
Denies any trouble swallowing or difficulty breathing

## 2014-09-27 NOTE — ED Notes (Signed)
Pt with swelling to right side of neck, c/o sore throat for past 2 days, denies chills or fever; also rash that itches to chest

## 2014-11-06 ENCOUNTER — Emergency Department (HOSPITAL_COMMUNITY)
Admission: EM | Admit: 2014-11-06 | Discharge: 2014-11-06 | Disposition: A | Discharge status: Home / Self Care | Payer: Self-pay | Attending: Emergency Medicine | Admitting: Emergency Medicine

## 2014-11-06 ENCOUNTER — Encounter (HOSPITAL_COMMUNITY): Payer: Self-pay

## 2014-11-06 DIAGNOSIS — J45909 Unspecified asthma, uncomplicated: Secondary | ICD-10-CM | POA: Insufficient documentation

## 2014-11-06 DIAGNOSIS — N764 Abscess of vulva: Secondary | ICD-10-CM | POA: Insufficient documentation

## 2014-11-06 DIAGNOSIS — Z720 Tobacco use: Secondary | ICD-10-CM | POA: Insufficient documentation

## 2014-11-06 DIAGNOSIS — Z792 Long term (current) use of antibiotics: Secondary | ICD-10-CM | POA: Insufficient documentation

## 2014-11-06 DIAGNOSIS — Z880 Allergy status to penicillin: Secondary | ICD-10-CM | POA: Insufficient documentation

## 2014-11-06 DIAGNOSIS — Z79899 Other long term (current) drug therapy: Secondary | ICD-10-CM | POA: Insufficient documentation

## 2014-11-06 MED ORDER — LIDOCAINE HCL (PF) 1 % IJ SOLN
5.0000 mL | Freq: Once | INTRAMUSCULAR | Status: AC
  Administered 2014-11-06: 5 mL
  Filled 2014-11-06: qty 5

## 2014-11-06 MED ORDER — DOXYCYCLINE HYCLATE 100 MG PO CAPS: 100.0000 mg | ORAL_CAPSULE | Freq: Two times a day (BID) | ORAL | Status: DC

## 2014-11-06 NOTE — ED Notes (Signed)
   11/06/14 2239  Genitourinary  Genitourinary (WDL) X  Female Genitalia Intact;Lesion  Abscess to the left labia.

## 2014-11-06 NOTE — ED Notes (Signed)
Pt alert & oriented x4, stable gait. Patient given discharge instructions, paperwork & prescription(s). Patient  instructed to stop at the registration desk to finish any additional paperwork. Patient verbalized understanding. Pt left department w/ no further questions. 

## 2014-11-06 NOTE — Discharge Instructions (Signed)
Sit in warm tubs of water or apply warm wet compresses several times a day. Take tylenol and ibuprofen as needed for pain. Return as needed for problems.

## 2014-11-06 NOTE — ED Provider Notes (Signed)
CSN: [id number]     Arrival date & time 11/06/14  2157 History   First MD Initiated Contact with Patient 11/06/14 2208     Chief Complaint  Patient presents with  . Abscess     (Consider location/radiation/quality/duration/timing/severity/associated sxs/prior Treatment) Patient is a 29 y.o. female presenting with abscess. The history is provided by the patient.  Abscess Location:  Ano-genital Ano-genital abscess location:  Vagina Abscess quality: painful, redness and warmth   Red streaking: no   Duration:  1 month Progression:  Worsening Pain details:    Quality:  Sharp and throbbing   Severity:  Moderate   Timing:  Constant   Progression:  Worsening Chronicity:  New  Diane Norton is a 29 y.o. female who presents to the ED with pain and swelling of the vaginal area. She thinks there is an abscess.   Past Medical History  Diagnosis Date  . Asthma   . Seizures    Past Surgical History  Procedure Laterality Date  . Cholecystectomy     History reviewed. No pertinent family history. Social History  Substance Use Topics  . Smoking status: Current Every Day Smoker -- 1.00 packs/day    Types: Cigarettes  . Smokeless tobacco: None  . Alcohol Use: No   OB History    No data available     Review of Systems    Allergies  Diane Norton; Diane Norton; Diane Norton; and Diane Norton  Home Medications   Prior to Admission medications   Medication Sig Start Date End Date Taking? Authorizing Provider  aspirin-acetaminophen-caffeine (Diane Norton) [phone] MG per tablet Take 1 tablet by mouth every 6 (six) hours as needed for headache.    Historical Provider, MD  cephALEXin (KEFLEX) 500 MG capsule Take 1 capsule (500 mg total) by mouth 4 (four) times daily. 09/27/14   Diane Areas, PA-C  clindamycin (CLEOCIN) 300 MG capsule Take 1 capsule (300 mg total) by mouth 4 (four) times daily. X 7 days 03/04/14   Diane Rhine, MD  diphenhydrAMINE (SOMINEX) 25 MG tablet Take 25 mg by  mouth at bedtime as needed for sleep.    Historical Provider, MD  doxycycline (VIBRAMYCIN) 100 MG capsule Take 1 capsule (100 mg total) by mouth 2 (two) times daily. 11/06/14   Diane Pho Orlene Och, NP  HYDROcodone-acetaminophen (NORCO/VICODIN) 5-325 MG per tablet Take 2 tablets by mouth every 4 (four) hours as needed. 09/27/14   Diane Areas, PA-C  oxyCODONE-acetaminophen (PERCOCET/ROXICET) 5-325 MG per tablet Take 1-2 tablets by mouth every 6 (six) hours as needed for severe pain. 07/22/14   Diane Baton, MD   BP 137/59 mmHg  Pulse 84  Temp(Src) 98.1 F (36.7 C) (Oral)  Resp 18  Ht 5' 4.5" (1.638 m)  Wt 140 lb (63.504 kg)  BMI 23.67 kg/m2  SpO2 97%  LMP 11/06/2014 Physical Exam  Constitutional: She is oriented to person, place, and time. She appears well-developed and well-nourished.  HENT:  Head: Normocephalic and atraumatic.  Eyes: EOM are normal.  Neck: Neck supple.  Cardiovascular: Normal rate.   Pulmonary/Chest: Effort normal.  Genitourinary:    There is tenderness on the left labia.  Tender raised area to left labia with pustular center.   Musculoskeletal: Normal range of motion.  Neurological: She is alert and oriented to person, place, and time. No cranial nerve deficit.  Skin: Skin is warm and dry.  Psychiatric: She has a normal mood and affect. Her behavior is normal.  Nursing note and vitals reviewed.  ED Course  INCISION AND DRAINAGE Date/Time: 11/06/2014 11:20 PM Performed by: Diane Norton Authorized by: Diane Norton Consent: Verbal consent obtained. Risks and benefits: risks, benefits and alternatives were discussed Consent given by: patient Patient understanding: patient states understanding of the procedure being performed Required items: required blood products, implants, devices, and special equipment available Patient identity confirmed: verbally with patient Type: abscess Body area: anogenital (left labia) Anesthesia: local infiltration Local  anesthetic: lidocaine 1% without epinephrine Anesthetic total: 3 ml Patient sedated: no Scalpel size: 11 Needle gauge: 22 Incision type: single straight Incision depth: subcutaneous Complexity: simple Drainage: purulent Drainage amount: moderate Wound treatment: wound left open Patient tolerance: Patient tolerated the procedure well with no immediate complications      MDM  29 y.o. female with pain and swelling of left labia. Stable for d/c after area drained. Will start antibiotics and she will sit in warm tubs of water. Discussed with the patient and all questioned fully answered. She will return if any problems arise.   Final diagnoses:  Left genital labial abscess       Diane Napoleon, NP 11/06/14 2326  Diane Jester, DO 11/09/14 1756

## 2014-11-06 NOTE — ED Notes (Signed)
Patient states vaginal abscess X1 month and states "its gotten bigger over the past few days" patient denies fever and drainage.

## 2014-12-26 ENCOUNTER — Encounter (HOSPITAL_COMMUNITY): Payer: Self-pay | Admitting: Emergency Medicine

## 2014-12-26 ENCOUNTER — Emergency Department (HOSPITAL_COMMUNITY)
Admission: EM | Admit: 2014-12-26 | Discharge: 2014-12-26 | Disposition: A | Discharge status: Home / Self Care | Payer: Self-pay | Attending: Emergency Medicine | Admitting: Emergency Medicine

## 2014-12-26 DIAGNOSIS — Z880 Allergy status to penicillin: Secondary | ICD-10-CM | POA: Insufficient documentation

## 2014-12-26 DIAGNOSIS — K047 Periapical abscess without sinus: Secondary | ICD-10-CM | POA: Insufficient documentation

## 2014-12-26 DIAGNOSIS — J45909 Unspecified asthma, uncomplicated: Secondary | ICD-10-CM | POA: Insufficient documentation

## 2014-12-26 DIAGNOSIS — Z720 Tobacco use: Secondary | ICD-10-CM | POA: Insufficient documentation

## 2014-12-26 DIAGNOSIS — K029 Dental caries, unspecified: Secondary | ICD-10-CM | POA: Insufficient documentation

## 2014-12-26 DIAGNOSIS — Z8669 Personal history of other diseases of the nervous system and sense organs: Secondary | ICD-10-CM | POA: Insufficient documentation

## 2014-12-26 DIAGNOSIS — Z792 Long term (current) use of antibiotics: Secondary | ICD-10-CM | POA: Insufficient documentation

## 2014-12-26 MED ORDER — CLINDAMYCIN HCL 150 MG PO CAPS: 150.0000 mg | ORAL_CAPSULE | Freq: Four times a day (QID) | ORAL | Status: DC

## 2014-12-26 MED ORDER — TRAMADOL HCL 50 MG PO TABS
50.0000 mg | ORAL_TABLET | Freq: Once | ORAL | Status: AC
  Administered 2014-12-26: 50 mg via ORAL
  Filled 2014-12-26: qty 1

## 2014-12-26 MED ORDER — CLINDAMYCIN HCL 150 MG PO CAPS
150.0000 mg | ORAL_CAPSULE | Freq: Once | ORAL | Status: AC
  Administered 2014-12-26: 150 mg via ORAL
  Filled 2014-12-26: qty 1

## 2014-12-26 MED ORDER — TRAMADOL HCL 50 MG PO TABS: 50.0000 mg | ORAL_TABLET | Freq: Four times a day (QID) | ORAL | Status: DC | PRN

## 2014-12-26 NOTE — ED Provider Notes (Signed)
CSN: [id number]     Arrival date & time 12/26/14  1635 History  By signing my name below, I, Diane Norton, attest that this documentation has been prepared under the direction and in the presence of [name], PA-C. Electronically Signed: [name] ED Scribe. 12/26/2014. 5:08 PM.    Chief Complaint  Patient presents with  . Dental Pain   The history is provided by the patient. No language interpreter was used.   HPI Comments: Diane Norton is a 29 y.o. female with hx of asthma, seizures who presents to the Emergency Department complaining of right lower dental pain onset yesterday emanating from an old fractured tooth; unrelieved with Orajel, Excedrin, ibuprofen, and Tylenol.  Associated symtpoms include percieved swelling below the right jaw line and trouble sleeping due to pain.  She reports hx of left upper dental pain that resolved spontaneously.  Patient does not have a dentist currently but has been seen previously.  She denies new injury, drainage, fever.  Patient reports an allergy to Pencillin manifesting as a rash.   Past Medical History  Diagnosis Date  . Asthma   . Seizures [facility](HCC)    Past Surgical History  Procedure Laterality Date  . Cholecystectomy     History reviewed. No pertinent family history. Social History  Substance Use Topics  . Smoking status: Current Every Day Smoker -- 1.00 packs/day    Types: Cigarettes  . Smokeless tobacco: None  . Alcohol Use: No   OB History    No data available     Review of Systems  Constitutional: Negative for fever.  HENT: Positive for dental problem. Negative for congestion and sore throat.   Eyes: Negative.   Respiratory: Negative for chest tightness and shortness of breath.   Cardiovascular: Negative for chest pain.  Gastrointestinal: Negative for nausea and abdominal pain.  Genitourinary: Negative.   Musculoskeletal: Negative for joint swelling, arthralgias and neck pain.  Skin: Negative.  Negative for rash and wound.   Neurological: Negative for dizziness, weakness, light-headedness, numbness and headaches.  Psychiatric/Behavioral: Negative.       Allergies  Darvocet; Penicillins; Bactrim; and Ciprofloxacin  Home Medications   Prior to Admission medications   Medication Sig Start Date End Date Taking? Authorizing Provider  aspirin-acetaminophen-caffeine (EXCEDRIN MIGRAINE) [id number] MG per tablet Take 1 tablet by mouth every 6 (six) hours as needed for headache.    Historical Provider, MD  cephALEXin (KEFLEX) 500 MG capsule Take 1 capsule (500 mg total) by mouth 4 (four) times daily. 09/27/14   Elson AreasLeslie K Sofia, PA-C  clindamycin (CLEOCIN) 150 MG capsule Take 1 capsule (150 mg total) by mouth every 6 (six) hours. 12/26/14   Burgess AmorJulie Hanad Leino, PA-C  diphenhydrAMINE (SOMINEX) 25 MG tablet Take 25 mg by mouth at bedtime as needed for sleep.    Historical Provider, MD  doxycycline (VIBRAMYCIN) 100 MG capsule Take 1 capsule (100 mg total) by mouth 2 (two) times daily. 11/06/14   Hope Orlene OchM Neese, NP  HYDROcodone-acetaminophen (NORCO/VICODIN) 5-325 MG per tablet Take 2 tablets by mouth every 4 (four) hours as needed. 09/27/14   Elson AreasLeslie K Sofia, PA-C  oxyCODONE-acetaminophen (PERCOCET/ROXICET) 5-325 MG per tablet Take 1-2 tablets by mouth every 6 (six) hours as needed for severe pain. 07/22/14   Shon Batonourtney F Horton, MD  traMADol (ULTRAM) 50 MG tablet Take 1 tablet (50 mg total) by mouth every 6 (six) hours as needed. 12/26/14   Burgess AmorJulie Keyonta Barradas, PA-C   BP 113/55 mmHg  Pulse 72  Temp(Src) 97.8  F (36.6 C) (Oral)  Resp 18  Ht  (1.626 m)  Wt 145 lb (65.772 kg)  BMI 24.88 kg/m2  SpO2 100%  LMP 12/04/2014 Physical Exam  Constitutional: She is oriented to person, place, and time. She appears well-developed and well-nourished. No distress.  HENT:  Head: Normocephalic and atraumatic.  Right Ear: Tympanic membrane and external ear normal.  Left Ear: Tympanic membrane and external ear normal.  Mouth/Throat: Oropharynx is clear  and moist and mucous membranes are normal. No oral lesions. No trismus in the jaw. Dental abscesses present.  Patient has decay to the gum line of her right lower 2nd molar tooth with surrounding gingival erythema with mild edema.  No fluctuant pus pocket.  No facial cellulitis.  Sublingual spaces soft.   Eyes: Conjunctivae are normal.  Neck: Normal range of motion. Neck supple.  Cardiovascular: Normal rate and normal heart sounds.   Pulmonary/Chest: Effort normal.  Musculoskeletal: Normal range of motion.  Lymphadenopathy:    She has no cervical adenopathy.  Neurological: She is alert and oriented to person, place, and time.  Skin: Skin is warm and dry. No erythema.  Psychiatric: She has a normal mood and affect.    ED Course  Procedures (including critical care time)  DIAGNOSTIC STUDIES: Oxygen Saturation is 100% on RA, normal by my interpretation.    COORDINATION OF CARE:  5:02 PM Will order/prescribe antibiotic and pain medication.  Patient acknowledges and agrees with plan.    Labs Review Labs Reviewed - No data to display  Imaging Review No results found. I have personally reviewed and evaluated these images and lab results as part of my medical decision-making.   EKG Interpretation None      MDM   Final diagnoses:  Dental infection    Clindamycin, tramadol prescribed, dental referrals given.  Advised f/u with dentistry.  No trismus, VSS, no abscess at this time but suspect early abscess/infection  I personally performed the services described in this documentation, which was scribed in my presence. The recorded information has been reviewed and is accurate.   Burgess Amor, PA-C 12/26/14 1748  Doug Sou, MD 12/26/14 [phone]

## 2014-12-26 NOTE — ED Notes (Signed)
Patient complaining of lower right dental pain starting yesterday. 

## 2014-12-26 NOTE — Discharge Instructions (Signed)

## 2015-01-09 ENCOUNTER — Encounter (HOSPITAL_COMMUNITY): Payer: Self-pay | Admitting: Emergency Medicine

## 2015-01-09 ENCOUNTER — Inpatient Hospital Stay (HOSPITAL_COMMUNITY)
Admission: EM | Admit: 2015-01-09 | Discharge: 2015-01-11 | DRG: 481 | Disposition: A | Discharge status: Home / Self Care | Payer: No Typology Code available for payment source | Attending: Orthopedic Surgery | Admitting: Orthopedic Surgery

## 2015-01-09 ENCOUNTER — Emergency Department (HOSPITAL_COMMUNITY): Payer: No Typology Code available for payment source

## 2015-01-09 ENCOUNTER — Emergency Department (HOSPITAL_COMMUNITY): Payer: No Typology Code available for payment source | Admitting: Anesthesiology

## 2015-01-09 ENCOUNTER — Encounter (HOSPITAL_COMMUNITY)
Admission: EM | Disposition: A | Discharge status: Home / Self Care | Payer: Self-pay | Source: Home / Self Care | Attending: Orthopedic Surgery

## 2015-01-09 DIAGNOSIS — S82001B Unspecified fracture of right patella, initial encounter for open fracture type I or II: Secondary | ICD-10-CM | POA: Diagnosis present

## 2015-01-09 DIAGNOSIS — Z881 Allergy status to other antibiotic agents status: Secondary | ICD-10-CM

## 2015-01-09 DIAGNOSIS — Z885 Allergy status to narcotic agent status: Secondary | ICD-10-CM

## 2015-01-09 DIAGNOSIS — Z880 Allergy status to penicillin: Secondary | ICD-10-CM

## 2015-01-09 DIAGNOSIS — S72301A Unspecified fracture of shaft of right femur, initial encounter for closed fracture: Principal | ICD-10-CM | POA: Diagnosis present

## 2015-01-09 DIAGNOSIS — T148XXA Other injury of unspecified body region, initial encounter: Secondary | ICD-10-CM

## 2015-01-09 DIAGNOSIS — T1490XA Injury, unspecified, initial encounter: Secondary | ICD-10-CM

## 2015-01-09 DIAGNOSIS — S7291XA Unspecified fracture of right femur, initial encounter for closed fracture: Secondary | ICD-10-CM | POA: Diagnosis present

## 2015-01-09 DIAGNOSIS — Z883 Allergy status to other anti-infective agents status: Secondary | ICD-10-CM

## 2015-01-09 DIAGNOSIS — F17200 Nicotine dependence, unspecified, uncomplicated: Secondary | ICD-10-CM | POA: Diagnosis present

## 2015-01-09 DIAGNOSIS — S62001A Unspecified fracture of navicular [scaphoid] bone of right wrist, initial encounter for closed fracture: Secondary | ICD-10-CM | POA: Diagnosis present

## 2015-01-09 HISTORY — PX: ORIF PATELLA: SHX5033

## 2015-01-09 HISTORY — PX: FEMUR IM NAIL: SHX1597

## 2015-01-09 LAB — COMPREHENSIVE METABOLIC PANEL
ALK PHOS: 85 U/L (ref 38–126)
ALT: 20 U/L (ref 14–54)
AST: 30 U/L (ref 15–41)
Albumin: 3.6 g/dL (ref 3.5–5.0)
Anion gap: 7 (ref 5–15)
BUN: 7 mg/dL (ref 6–20)
CHLORIDE: 108 mmol/L (ref 101–111)
CO2: 23 mmol/L (ref 22–32)
Calcium: 8.1 mg/dL — ABNORMAL LOW (ref 8.9–10.3)
Creatinine, Ser: 0.7 mg/dL (ref 0.44–1.00)
GFR calc non Af Amer: >60 mL/min (ref >60)
Glucose, Bld: 112 mg/dL — ABNORMAL HIGH (ref 65–99)
Potassium: 3.5 mmol/L (ref 3.5–5.1)
SODIUM: 138 mmol/L (ref 135–145)
TOTAL PROTEIN: 6.2 g/dL — AB (ref 6.5–8.1)
Total Bilirubin: 0.2 mg/dL — ABNORMAL LOW (ref 0.3–1.2)

## 2015-01-09 LAB — CDS SEROLOGY

## 2015-01-09 LAB — CBC
HEMATOCRIT: 40.1 % (ref 36.0–46.0)
HEMOGLOBIN: 13.7 g/dL (ref 12.0–15.0)
MCH: 31.9 pg (ref 26.0–34.0)
MCHC: 34.2 g/dL (ref 30.0–36.0)
MCV: 93.3 fL (ref 78.0–100.0)
Platelets: 191 10*3/uL (ref 150–400)
RBC: 4.3 MIL/uL (ref 3.87–5.11)
RDW: 13.4 % (ref 11.5–15.5)
WBC: 14.9 10*3/uL — AB (ref 4.0–10.5)

## 2015-01-09 LAB — SAMPLE TO BLOOD BANK

## 2015-01-09 LAB — PROTIME-INR
INR: 1.26 (ref 0.00–1.49)
Prothrombin Time: 16 seconds — ABNORMAL HIGH (ref 11.6–15.2)

## 2015-01-09 LAB — ETHANOL: Alcohol, Ethyl (B): <5 mg/dL (ref <5)

## 2015-01-09 SURGERY — INSERTION, INTRAMEDULLARY ROD, FEMUR, RETROGRADE
Anesthesia: General | Site: Leg Upper | Laterality: Right

## 2015-01-09 MED ORDER — ROCURONIUM BROMIDE 100 MG/10ML IV SOLN
INTRAVENOUS | Status: DC | PRN
  Administered 2015-01-09: 20 mg via INTRAVENOUS

## 2015-01-09 MED ORDER — SUCCINYLCHOLINE CHLORIDE 20 MG/ML IJ SOLN
INTRAMUSCULAR | Status: AC
  Filled 2015-01-09: qty 2

## 2015-01-09 MED ORDER — CEFAZOLIN SODIUM-DEXTROSE 2-3 GM-% IV SOLR
INTRAVENOUS | Status: DC | PRN
  Administered 2015-01-09: 2 g via INTRAVENOUS

## 2015-01-09 MED ORDER — HYDROMORPHONE HCL 1 MG/ML IJ SOLN
1.0000 mg | Freq: Once | INTRAMUSCULAR | Status: AC
  Administered 2015-01-09: 1 mg via INTRAVENOUS

## 2015-01-09 MED ORDER — GLYCOPYRROLATE 0.2 MG/ML IJ SOLN
INTRAMUSCULAR | Status: AC
  Filled 2015-01-09: qty 3

## 2015-01-09 MED ORDER — HYDROMORPHONE HCL 1 MG/ML IJ SOLN
INTRAMUSCULAR | Status: AC
  Administered 2015-01-10: 1 mg
  Filled 2015-01-09: qty 1

## 2015-01-09 MED ORDER — NEOSTIGMINE METHYLSULFATE 10 MG/10ML IV SOLN
INTRAVENOUS | Status: AC
  Filled 2015-01-09: qty 3

## 2015-01-09 MED ORDER — CEFAZOLIN SODIUM 1-5 GM-% IV SOLN
1.0000 g | Freq: Once | INTRAVENOUS | Status: AC
Start: 2015-01-09 — End: 2015-01-09
  Administered 2015-01-09: 1 g via INTRAVENOUS
  Filled 2015-01-09: qty 50

## 2015-01-09 MED ORDER — FENTANYL CITRATE (PF) 250 MCG/5ML IJ SOLN
INTRAMUSCULAR | Status: DC | PRN
  Administered 2015-01-09: 50 ug via INTRAVENOUS
  Administered 2015-01-09 – 2015-01-10 (×4): 100 ug via INTRAVENOUS

## 2015-01-09 MED ORDER — LIDOCAINE HCL (CARDIAC) 20 MG/ML IV SOLN
INTRAVENOUS | Status: DC | PRN
  Administered 2015-01-09: 50 mg via INTRAVENOUS

## 2015-01-09 MED ORDER — LACTATED RINGERS IV SOLN
INTRAVENOUS | Status: DC | PRN
  Administered 2015-01-09 – 2015-01-10 (×2): via INTRAVENOUS

## 2015-01-09 MED ORDER — HYDROMORPHONE HCL 1 MG/ML IJ SOLN
INTRAMUSCULAR | Status: AC
  Administered 2015-01-09: 1 mg
  Filled 2015-01-09: qty 1

## 2015-01-09 MED ORDER — SUCCINYLCHOLINE CHLORIDE 20 MG/ML IJ SOLN
INTRAMUSCULAR | Status: DC | PRN
Start: 2015-01-09 — End: 2015-01-10
  Administered 2015-01-09: 100 mg via INTRAVENOUS

## 2015-01-09 MED ORDER — ONDANSETRON HCL 4 MG/2ML IJ SOLN
INTRAMUSCULAR | Status: AC
  Administered 2015-01-09: 4 mg
  Filled 2015-01-09: qty 2

## 2015-01-09 MED ORDER — PROPOFOL 10 MG/ML IV BOLUS
INTRAVENOUS | Status: AC
  Filled 2015-01-09: qty 20

## 2015-01-09 MED ORDER — ALBUTEROL SULFATE HFA 108 (90 BASE) MCG/ACT IN AERS
INHALATION_SPRAY | RESPIRATORY_TRACT | Status: DC | PRN
  Administered 2015-01-09: 4 via RESPIRATORY_TRACT

## 2015-01-09 MED ORDER — SODIUM CHLORIDE 0.9 % IV SOLN
INTRAVENOUS | Status: AC | PRN
  Administered 2015-01-09: 1000 mL via INTRAVENOUS

## 2015-01-09 MED ORDER — HYDROMORPHONE HCL 1 MG/ML IJ SOLN
INTRAMUSCULAR | Status: AC
  Filled 2015-01-09: qty 1

## 2015-01-09 MED ORDER — PROPOFOL 10 MG/ML IV BOLUS
INTRAVENOUS | Status: DC | PRN
  Administered 2015-01-09: 170 mg via INTRAVENOUS

## 2015-01-09 MED ORDER — IOHEXOL 300 MG/ML  SOLN
100.0000 mL | Freq: Once | INTRAMUSCULAR | Status: AC | PRN
  Administered 2015-01-09: 100 mL via INTRAVENOUS

## 2015-01-09 MED ORDER — FENTANYL CITRATE (PF) 250 MCG/5ML IJ SOLN
INTRAMUSCULAR | Status: AC
  Filled 2015-01-09: qty 5

## 2015-01-09 MED ORDER — NOREPINEPHRINE BITARTRATE 1 MG/ML IV SOLN: 4000.0000 ug | INTRAVENOUS | Status: DC | PRN

## 2015-01-09 SURGICAL SUPPLY — 94 items
BANDAGE ELASTIC 4 VELCRO ST LF (GAUZE/BANDAGES/DRESSINGS) ×1 IMPLANT
BANDAGE ELASTIC 6 VELCRO ST LF (GAUZE/BANDAGES/DRESSINGS) ×4 IMPLANT
BANDAGE ESMARK 6X9 LF (GAUZE/BANDAGES/DRESSINGS) IMPLANT
BIT DRILL LONG 4.0 (BIT) IMPLANT
BIT DRILL SHORT 4.0 (BIT) IMPLANT
BLADE SURG 10 STRL SS (BLADE) ×1 IMPLANT
BLADE SURG ROTATE 9660 (MISCELLANEOUS) ×3 IMPLANT
BNDG CMPR 9X6 STRL LF SNTH (GAUZE/BANDAGES/DRESSINGS) ×1
BNDG COHESIVE 6X5 TAN STRL LF (GAUZE/BANDAGES/DRESSINGS) ×3 IMPLANT
BNDG ESMARK 6X9 LF (GAUZE/BANDAGES/DRESSINGS) ×3
BNDG GAUZE ELAST 4 BULKY (GAUZE/BANDAGES/DRESSINGS) ×3 IMPLANT
CLEANER TIP ELECTROSURG 2X2 (MISCELLANEOUS) ×1 IMPLANT
COVER MAYO STAND STRL (DRAPES) ×3 IMPLANT
COVER SURGICAL LIGHT HANDLE (MISCELLANEOUS) ×4 IMPLANT
CUFF TOURNIQUET SINGLE 34IN LL (TOURNIQUET CUFF) IMPLANT
CUFF TOURNIQUET SINGLE 44IN (TOURNIQUET CUFF) IMPLANT
DRAPE C-ARM 42X72 X-RAY (DRAPES) ×3 IMPLANT
DRAPE C-ARMOR (DRAPES) ×2 IMPLANT
DRAPE IMP U-DRAPE 54X76 (DRAPES) ×1 IMPLANT
DRAPE ORTHO SPLIT 77X108 STRL (DRAPES) ×6
DRAPE PROXIMA HALF (DRAPES) ×4 IMPLANT
DRAPE SURG ORHT 6 SPLT 77X108 (DRAPES) IMPLANT
DRAPE U-SHAPE 47X51 STRL (DRAPES) ×3 IMPLANT
DRILL BIT LONG 4.0 (BIT) ×3
DRILL BIT SHORT 4.0 (BIT) ×3
DRSG ADAPTIC 3X8 NADH LF (GAUZE/BANDAGES/DRESSINGS) ×3 IMPLANT
DRSG EMULSION OIL 3X3 NADH (GAUZE/BANDAGES/DRESSINGS) ×1 IMPLANT
DRSG PAD ABDOMINAL 8X10 ST (GAUZE/BANDAGES/DRESSINGS) ×3 IMPLANT
DURAPREP 26ML APPLICATOR (WOUND CARE) ×5 IMPLANT
ELECT REM PT RETURN 9FT ADLT (ELECTROSURGICAL) ×3
ELECTRODE REM PT RTRN 9FT ADLT (ELECTROSURGICAL) ×1 IMPLANT
GAUZE SPONGE 4X4 12PLY STRL (GAUZE/BANDAGES/DRESSINGS) ×1 IMPLANT
GLOVE BIO SURGEON STRL SZ7.5 (GLOVE) ×4 IMPLANT
GLOVE BIOGEL PI IND STRL 8 (GLOVE) IMPLANT
GLOVE BIOGEL PI IND STRL 9 (GLOVE) ×1 IMPLANT
GLOVE BIOGEL PI INDICATOR 8 (GLOVE) ×2
GLOVE BIOGEL PI INDICATOR 9 (GLOVE) ×6
GLOVE ECLIPSE 6.5 STRL STRAW (GLOVE) ×2 IMPLANT
GLOVE SURG ORTHO 9.0 STRL STRW (GLOVE) ×5 IMPLANT
GOWN STRL REUS W/ TWL XL LVL3 (GOWN DISPOSABLE) ×3 IMPLANT
GOWN STRL REUS W/TWL XL LVL3 (GOWN DISPOSABLE) ×6
GUIDE PIN 3.2MM (MISCELLANEOUS) ×3
GUIDE PIN ORTH 343X3.2XBRAD (MISCELLANEOUS) IMPLANT
GUIDE ROD 3.0 (MISCELLANEOUS) ×3
GUIDE WIRE ×2 IMPLANT
GUIDEWIRE NON THREAD 1.6MM (WIRE) ×4 IMPLANT
HANDPIECE INTERPULSE COAX TIP (DISPOSABLE) ×3
KIT BASIN OR (CUSTOM PROCEDURE TRAY) ×3 IMPLANT
KIT ROOM TURNOVER OR (KITS) ×3 IMPLANT
MANIFOLD NEPTUNE II (INSTRUMENTS) ×3 IMPLANT
MANIFOLD NEPTUNE WASTE (CANNULA) ×2 IMPLANT
NAIL RETRODGRADE FEMORAL 10X30 (Nail) ×2 IMPLANT
NAIL RETROGRADE 10X34 (Nail) ×2 IMPLANT
NDL HYPO 21X1.5 SAFETY (NEEDLE) ×1 IMPLANT
NDL HYPO 25GX1X1/2 BEV (NEEDLE) ×1 IMPLANT
NEEDLE HYPO 21X1.5 SAFETY (NEEDLE) IMPLANT
NEEDLE HYPO 25GX1X1/2 BEV (NEEDLE) IMPLANT
NS IRRIG 1000ML POUR BTL (IV SOLUTION) ×3 IMPLANT
PACK ORTHO EXTREMITY (CUSTOM PROCEDURE TRAY) ×3 IMPLANT
PACK UNIVERSAL I (CUSTOM PROCEDURE TRAY) ×3 IMPLANT
PAD ARMBOARD 7.5X6 YLW CONV (MISCELLANEOUS) ×6 IMPLANT
PAD CAST 4YDX4 CTTN HI CHSV (CAST SUPPLIES) ×2 IMPLANT
PADDING CAST COTTON 4X4 STRL (CAST SUPPLIES)
PENCIL BUTTON HOLSTER BLD 10FT (ELECTRODE) ×3 IMPLANT
ROD GUIDE 3.0 (MISCELLANEOUS) IMPLANT
SCREW HEADLESS COMP 4.5X32MM (Screw) ×4 IMPLANT
SCREW TRIGEN LOW PROF 5.0X35 (Screw) ×2 IMPLANT
SCREW TRIGEN LOW PROF 5.0X75 (Screw) ×2 IMPLANT
SET HNDPC FAN SPRY TIP SCT (DISPOSABLE) IMPLANT
SPONGE GAUZE 4X4 12PLY STER LF (GAUZE/BANDAGES/DRESSINGS) ×2 IMPLANT
SPONGE LAP 18X18 X RAY DECT (DISPOSABLE) ×7 IMPLANT
SPONGE LAP 4X18 X RAY DECT (DISPOSABLE) ×2 IMPLANT
STAPLER VISISTAT 35W (STAPLE) ×3 IMPLANT
STOCKINETTE IMPERVIOUS LG (DRAPES) ×3 IMPLANT
SUCTION FRAZIER TIP 10 FR DISP (SUCTIONS) ×3 IMPLANT
SUT ETHILON 3 0 FSL (SUTURE) IMPLANT
SUT STEEL 5 (SUTURE) ×1 IMPLANT
SUT VIC AB 0 CT1 27 (SUTURE)
SUT VIC AB 0 CT1 27XBRD ANBCTR (SUTURE) ×1 IMPLANT
SUT VIC AB 0 CTB1 27 (SUTURE) ×3 IMPLANT
SUT VIC AB 1 CT1 27 (SUTURE) ×3
SUT VIC AB 1 CT1 27XBRD ANBCTR (SUTURE) IMPLANT
SUT VIC AB 2-0 CT1 27 (SUTURE)
SUT VIC AB 2-0 CT1 TAPERPNT 27 (SUTURE) ×2 IMPLANT
SUT VIC AB 2-0 CTB1 (SUTURE) ×3 IMPLANT
SYR BULB IRRIGATION 50ML (SYRINGE) ×1 IMPLANT
SYR CONTROL 10ML LL (SYRINGE) ×1 IMPLANT
TOWEL OR 17X24 6PK STRL BLUE (TOWEL DISPOSABLE) ×3 IMPLANT
TOWEL OR 17X26 10 PK STRL BLUE (TOWEL DISPOSABLE) ×3 IMPLANT
TUBE CONNECTING 12'X1/4 (SUCTIONS) ×1
TUBE CONNECTING 12X1/4 (SUCTIONS) ×2 IMPLANT
UNDERPAD 30X30 INCONTINENT (UNDERPADS AND DIAPERS) ×1 IMPLANT
WATER STERILE IRR 1000ML POUR (IV SOLUTION) ×1 IMPLANT
YANKAUER SUCT BULB TIP NO VENT (SUCTIONS) ×2 IMPLANT

## 2015-01-09 NOTE — Progress Notes (Signed)
°   01/09/15 2118  Clinical Encounter Type  Visited With Family;Health care provider  Visit Type Initial;ED  Spiritual Encounters  Spiritual Needs Prayer;Emotional  Stress Factors  Patient Stress Factors Health changes  Family Stress Factors Health changes;Lack of knowledge   Patient was alert and was being attended to by x-ray and doctor. Escorted patient's mother to consult room B to await an update from the patient's doctor. Offered emotional support and assistance.  Lorna FewChristina Yarbrough, Chaplain On-call 9:36 PM 01/09/2015

## 2015-01-09 NOTE — Anesthesia Preprocedure Evaluation (Addendum)
Anesthesia Evaluation  Patient identified by MRN, date of birth, ID band Patient awake    Reviewed: Allergy & Precautions, NPO status , Patient's Chart, lab work & pertinent test results  Airway Mallampati: I  TM Distance: >3 FB Neck ROM: Full    Dental  (+) Teeth Intact, Dental Advisory Given   Pulmonary Current Smoker,    Pulmonary exam normal        Cardiovascular Normal cardiovascular exam     Neuro/Psych    GI/Hepatic   Endo/Other    Renal/GU      Musculoskeletal   Abdominal   Peds  Hematology   Anesthesia Other Findings   Reproductive/Obstetrics                            Anesthesia Physical Anesthesia Plan  ASA: II and emergent  Anesthesia Plan: General   Post-op Pain Management:    Induction: Intravenous, Cricoid pressure planned and Rapid sequence  Airway Management Planned: Oral ETT  Additional Equipment:   Intra-op Plan:   Post-operative Plan: Extubation in OR  Informed Consent: I have reviewed the patients History and Physical, chart, labs and discussed the procedure including the risks, benefits and alternatives for the proposed anesthesia with the patient or authorized representative who has indicated his/her understanding and acceptance.   Dental advisory given  Plan Discussed with: CRNA, Anesthesiologist and Surgeon  Anesthesia Plan Comments:         Anesthesia Quick Evaluation

## 2015-01-09 NOTE — Anesthesia Procedure Notes (Signed)
Procedure Name: Intubation Date/Time: 01/09/2015 11:41 PM Performed by: Brien MatesMAHONY, Jaydn Moscato D Pre-anesthesia Checklist: Patient identified, Emergency Drugs available, Suction available, Patient being monitored and Timeout performed Patient Re-evaluated:Patient Re-evaluated prior to inductionOxygen Delivery Method: Circle system utilized Preoxygenation: Pre-oxygenation with 100% oxygen Intubation Type: IV induction, Rapid sequence and Cricoid Pressure applied Laryngoscope Size: Miller and 2 Grade View: Grade I Tube type: Oral Tube size: 7.0 mm Number of attempts: 1 Airway Equipment and Method: Stylet Placement Confirmation: ETT inserted through vocal cords under direct vision,  positive ETCO2 and breath sounds checked- equal and bilateral Secured at: 21 cm Tube secured with: Tape Dental Injury: Teeth and Oropharynx as per pre-operative assessment

## 2015-01-09 NOTE — Progress Notes (Signed)
Orthopedic Tech Progress Note Patient Details:  Diane Norton Assessed pt. then assisted with removal of Hare traction splint. Patient ID: Diane Norton, female   DOB: 12/22/1985, 29 y.o.   MRN: [id number]   [name], [name] L 01/09/2015, 9:44 PM

## 2015-01-09 NOTE — ED Notes (Signed)
TRACTION  Removed by ortho tech and EDP for imaging.
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
## 2015-01-09 NOTE — ED Notes (Signed)
Orthopedic surgeon at bedside. 
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
## 2015-01-09 NOTE — ED Notes (Signed)
Pt transported to CT ?
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
## 2015-01-10 ENCOUNTER — Inpatient Hospital Stay (HOSPITAL_COMMUNITY): Payer: No Typology Code available for payment source

## 2015-01-10 ENCOUNTER — Encounter (HOSPITAL_COMMUNITY): Payer: Self-pay | Admitting: Orthopedic Surgery

## 2015-01-10 ENCOUNTER — Emergency Department (HOSPITAL_COMMUNITY): Payer: No Typology Code available for payment source

## 2015-01-10 DIAGNOSIS — Z881 Allergy status to other antibiotic agents status: Secondary | ICD-10-CM | POA: Diagnosis not present

## 2015-01-10 DIAGNOSIS — Z885 Allergy status to narcotic agent status: Secondary | ICD-10-CM | POA: Diagnosis not present

## 2015-01-10 DIAGNOSIS — S72301A Unspecified fracture of shaft of right femur, initial encounter for closed fracture: Secondary | ICD-10-CM | POA: Diagnosis present

## 2015-01-10 DIAGNOSIS — S62001A Unspecified fracture of navicular [scaphoid] bone of right wrist, initial encounter for closed fracture: Secondary | ICD-10-CM | POA: Diagnosis present

## 2015-01-10 DIAGNOSIS — F17200 Nicotine dependence, unspecified, uncomplicated: Secondary | ICD-10-CM | POA: Diagnosis present

## 2015-01-10 DIAGNOSIS — S82001B Unspecified fracture of right patella, initial encounter for open fracture type I or II: Secondary | ICD-10-CM | POA: Diagnosis present

## 2015-01-10 DIAGNOSIS — S7291XA Unspecified fracture of right femur, initial encounter for closed fracture: Secondary | ICD-10-CM | POA: Diagnosis present

## 2015-01-10 DIAGNOSIS — Z880 Allergy status to penicillin: Secondary | ICD-10-CM | POA: Diagnosis not present

## 2015-01-10 DIAGNOSIS — Z883 Allergy status to other anti-infective agents status: Secondary | ICD-10-CM | POA: Diagnosis not present

## 2015-01-10 LAB — CBC WITH DIFFERENTIAL/PLATELET
BASOS ABS: 0 10*3/uL (ref 0.0–0.1)
BASOS PCT: 0 %
EOS ABS: 0 10*3/uL (ref 0.0–0.7)
Eosinophils Relative: 0 %
HEMATOCRIT: 33.5 % — AB (ref 36.0–46.0)
HEMOGLOBIN: 11.2 g/dL — AB (ref 12.0–15.0)
Lymphocytes Relative: 26 %
Lymphs Abs: 1.9 10*3/uL (ref 0.7–4.0)
MCH: 31.3 pg (ref 26.0–34.0)
MCHC: 33.4 g/dL (ref 30.0–36.0)
MCV: 93.6 fL (ref 78.0–100.0)
Monocytes Absolute: 0.6 10*3/uL (ref 0.1–1.0)
Monocytes Relative: 8 %
NEUTROS ABS: 4.9 10*3/uL (ref 1.7–7.7)
NEUTROS PCT: 66 %
Platelets: 183 10*3/uL (ref 150–400)
RBC: 3.58 MIL/uL — AB (ref 3.87–5.11)
RDW: 13.5 % (ref 11.5–15.5)
WBC: 7.5 10*3/uL (ref 4.0–10.5)

## 2015-01-10 LAB — TYPE AND SCREEN
ABO/RH(D): O POS
Antibody Screen: NEGATIVE

## 2015-01-10 LAB — COMPREHENSIVE METABOLIC PANEL
ALBUMIN: 3 g/dL — AB (ref 3.5–5.0)
ALK PHOS: 121 U/L (ref 38–126)
ALT: 201 U/L — AB (ref 14–54)
ANION GAP: 7 (ref 5–15)
AST: 398 U/L — AB (ref 15–41)
BILIRUBIN TOTAL: 0.8 mg/dL (ref 0.3–1.2)
CALCIUM: 7.6 mg/dL — AB (ref 8.9–10.3)
CO2: 23 mmol/L (ref 22–32)
Chloride: 107 mmol/L (ref 101–111)
Creatinine, Ser: 0.71 mg/dL (ref 0.44–1.00)
GFR calc Af Amer: >60 mL/min (ref >60)
GFR calc non Af Amer: >60 mL/min (ref >60)
GLUCOSE: 115 mg/dL — AB (ref 65–99)
Potassium: 3.3 mmol/L — ABNORMAL LOW (ref 3.5–5.1)
SODIUM: 137 mmol/L (ref 135–145)
TOTAL PROTEIN: 5.2 g/dL — AB (ref 6.5–8.1)

## 2015-01-10 LAB — PROTIME-INR
INR: 1.19 (ref 0.00–1.49)
Prothrombin Time: 15.3 seconds — ABNORMAL HIGH (ref 11.6–15.2)

## 2015-01-10 LAB — ABO/RH: ABO/RH(D): O POS

## 2015-01-10 MED ORDER — 0.9 % SODIUM CHLORIDE (POUR BTL) OPTIME
TOPICAL | Status: DC | PRN
  Administered 2015-01-10: 1000 mL

## 2015-01-10 MED ORDER — HYDROMORPHONE HCL 1 MG/ML IJ SOLN
INTRAMUSCULAR | Status: AC
  Administered 2015-01-10: 1 mg
  Filled 2015-01-10: qty 1

## 2015-01-10 MED ORDER — WARFARIN - PHARMACIST DOSING INPATIENT: Freq: Every day | Status: DC

## 2015-01-10 MED ORDER — SODIUM CHLORIDE 0.45 % IV SOLN
INTRAVENOUS | Status: DC
  Administered 2015-01-10 (×2): via INTRAVENOUS
  Administered 2015-01-11: 1000 mL via INTRAVENOUS

## 2015-01-10 MED ORDER — GLYCOPYRROLATE 0.2 MG/ML IJ SOLN
INTRAMUSCULAR | Status: DC | PRN
  Administered 2015-01-10: 0.6 mg via INTRAVENOUS

## 2015-01-10 MED ORDER — ONDANSETRON HCL 4 MG PO TABS: 4.0000 mg | ORAL_TABLET | Freq: Four times a day (QID) | ORAL | Status: DC | PRN

## 2015-01-10 MED ORDER — ACETAMINOPHEN 325 MG PO TABS
650.0000 mg | ORAL_TABLET | Freq: Four times a day (QID) | ORAL | Status: DC | PRN
  Administered 2015-01-10 – 2015-01-11 (×2): 650 mg via ORAL
  Filled 2015-01-10 (×2): qty 2

## 2015-01-10 MED ORDER — OXYCODONE HCL 5 MG PO TABS
5.0000 mg | ORAL_TABLET | ORAL | Status: DC | PRN
  Administered 2015-01-11 (×2): 10 mg via ORAL
  Filled 2015-01-10 (×4): qty 2

## 2015-01-10 MED ORDER — HYDROMORPHONE HCL 1 MG/ML IJ SOLN
0.2500 mg | INTRAMUSCULAR | Status: DC | PRN
  Administered 2015-01-10 (×4): 0.5 mg via INTRAVENOUS

## 2015-01-10 MED ORDER — KETOROLAC TROMETHAMINE 15 MG/ML IJ SOLN
15.0000 mg | Freq: Four times a day (QID) | INTRAMUSCULAR | Status: AC
  Administered 2015-01-10 (×3): 15 mg via INTRAVENOUS
  Filled 2015-01-10 (×3): qty 1

## 2015-01-10 MED ORDER — ONDANSETRON HCL 4 MG/2ML IJ SOLN
INTRAMUSCULAR | Status: AC
  Filled 2015-01-10: qty 2

## 2015-01-10 MED ORDER — METOCLOPRAMIDE HCL 5 MG PO TABS: 5.0000 mg | ORAL_TABLET | Freq: Three times a day (TID) | ORAL | Status: DC | PRN

## 2015-01-10 MED ORDER — METHOCARBAMOL 1000 MG/10ML IJ SOLN
500.0000 mg | INTRAVENOUS | Status: AC
  Administered 2015-01-10: 500 mg via INTRAVENOUS
  Filled 2015-01-10: qty 5

## 2015-01-10 MED ORDER — CEFAZOLIN SODIUM-DEXTROSE 2-3 GM-% IV SOLR: 2.0000 g | INTRAVENOUS | Status: DC

## 2015-01-10 MED ORDER — DIPHENHYDRAMINE HCL 50 MG/ML IJ SOLN
INTRAMUSCULAR | Status: DC | PRN
  Administered 2015-01-10: 12.5 mg via INTRAVENOUS

## 2015-01-10 MED ORDER — METHOCARBAMOL 500 MG PO TABS
500.0000 mg | ORAL_TABLET | Freq: Four times a day (QID) | ORAL | Status: DC | PRN
  Administered 2015-01-10 – 2015-01-11 (×4): 500 mg via ORAL
  Filled 2015-01-10 (×4): qty 1

## 2015-01-10 MED ORDER — METHOCARBAMOL 1000 MG/10ML IJ SOLN
500.0000 mg | Freq: Four times a day (QID) | INTRAVENOUS | Status: DC | PRN
  Filled 2015-01-10: qty 5

## 2015-01-10 MED ORDER — NEOSTIGMINE METHYLSULFATE 10 MG/10ML IV SOLN
INTRAVENOUS | Status: DC | PRN
Start: 2015-01-10 — End: 2015-01-10
  Administered 2015-01-10: 4 mg via INTRAVENOUS

## 2015-01-10 MED ORDER — WARFARIN SODIUM 7.5 MG PO TABS
7.5000 mg | ORAL_TABLET | Freq: Once | ORAL | Status: AC
  Administered 2015-01-10: 7.5 mg via ORAL
  Filled 2015-01-10 (×2): qty 1

## 2015-01-10 MED ORDER — CEFAZOLIN SODIUM 1-5 GM-% IV SOLN
1.0000 g | Freq: Four times a day (QID) | INTRAVENOUS | Status: AC
  Administered 2015-01-10 (×3): 1 g via INTRAVENOUS
  Filled 2015-01-10 (×3): qty 50

## 2015-01-10 MED ORDER — ACETAMINOPHEN 650 MG RE SUPP: 650.0000 mg | Freq: Four times a day (QID) | RECTAL | Status: DC | PRN

## 2015-01-10 MED ORDER — FENTANYL CITRATE (PF) 250 MCG/5ML IJ SOLN
INTRAMUSCULAR | Status: AC
  Filled 2015-01-10: qty 5

## 2015-01-10 MED ORDER — PROMETHAZINE HCL 25 MG/ML IJ SOLN: 6.2500 mg | INTRAMUSCULAR | Status: DC | PRN

## 2015-01-10 MED ORDER — METHOCARBAMOL 500 MG PO TABS: 500.0000 mg | ORAL_TABLET | ORAL | Status: DC

## 2015-01-10 MED ORDER — HYDROMORPHONE HCL 1 MG/ML IJ SOLN
1.0000 mg | INTRAMUSCULAR | Status: DC | PRN
  Administered 2015-01-10 – 2015-01-11 (×10): 1 mg via INTRAVENOUS
  Filled 2015-01-10 (×13): qty 1

## 2015-01-10 MED ORDER — METOCLOPRAMIDE HCL 5 MG/ML IJ SOLN
5.0000 mg | Freq: Three times a day (TID) | INTRAMUSCULAR | Status: DC | PRN
Start: 2015-01-10 — End: 2015-01-11

## 2015-01-10 MED ORDER — SODIUM CHLORIDE 0.9 % IV SOLN
INTRAVENOUS | Status: DC
  Administered 2015-01-10: 03:00:00 via INTRAVENOUS

## 2015-01-10 MED ORDER — PHENYLEPHRINE HCL 10 MG/ML IJ SOLN
10.0000 mg | INTRAVENOUS | Status: DC | PRN
  Administered 2015-01-09: 20 ug/min via INTRAVENOUS

## 2015-01-10 MED ORDER — HYDROMORPHONE HCL 1 MG/ML IJ SOLN
INTRAMUSCULAR | Status: AC
  Administered 2015-01-10: 1 mg via INTRAVENOUS
  Filled 2015-01-10: qty 1

## 2015-01-10 MED ORDER — ONDANSETRON HCL 4 MG/2ML IJ SOLN
INTRAMUSCULAR | Status: DC | PRN
  Administered 2015-01-10: 4 mg via INTRAVENOUS

## 2015-01-10 MED ORDER — ONDANSETRON HCL 4 MG/2ML IJ SOLN: 4.0000 mg | Freq: Four times a day (QID) | INTRAMUSCULAR | Status: DC | PRN

## 2015-01-10 NOTE — Op Note (Signed)
01/09/2015 - 01/10/2015  1:14 AM  PATIENT:  Diane Norton    PRE-OPERATIVE DIAGNOSIS:  open type IIIA fractures of right  patella and closed right femur fracture  POST-OPERATIVE DIAGNOSIS:  Same  PROCEDURE:  INTRAMEDULLARY (IM) RETROGRADE FEMORAL NAILING,  OPEN REDUCTION INTERNAL (ORIF) FIXATION PATELLA Irrigation and debridement skin soft tissue and bone for open fracture. Closure of traumatic wound 2 x 5 cm  SURGEON:  Navea Woodrow V, MD  PHYSICIAN ASSISTANT:None ANESTHESIA:   General  PREOPERATIVE INDICATIONS:  Diane Norton is a  29 y.o. female with a diagnosis of open fractures of right femur and right patella who failed conservative measures and elected for surgical management.    The risks benefits and alternatives were discussed with the patient preoperatively including but not limited to the risks of infection, bleeding, nerve injury, cardiopulmonary complications, the need for revision surgery, among others, and the patient was willing to proceed.  OPERATIVE IMPLANTS: [employer] nail 10 x 340 mm locked proximally and distally, 4.5 headless cannulated screws to stabilize the patella  OPERATIVE FINDINGS: No gross contamination within the knee joint.  OPERATIVE PROCEDURE: Patient was brought to the operating room and underwent a general anesthetic. After adequate levels anesthesia obtained patient's placed supine on the flat Jackson table the right lower extremity was prepped using DuraPrep draped into a sterile field a timeout was called. The traumatic transverse incision over the patella was excised to a wound size of 3 x 5 cm. The wound was irrigated with pulsatile lavage. There was no contamination no foreign bodies within the joint. After irrigation with 3 L of pulsatile lavage a longitudinal incision was made and a medial patellar tendon incision was made the guidewire was inserted into the femur this was overdrilled a guidewire was then placed the reduction tool was used to  reduce the fracture and the guidewire was inserted up into the intertrochanteric region. This was sequentially reamed of the femur to 11.5 mm for a 10 mm nail. This measured a size 39 and a size 36 nail was inserted. While inserting the nail was visualized the nail was too long. This nail was removed and a size 34 nail was inserted after measuring a second time. The second measurement measured a 37 however size 34 nail was inserted this had the proper length. This was secured with a locking bolt distally and proximally. C-arm floss be verified alignment. The knee joint was again irrigated with pulsatile lavage. The patella fracture was reduced K wire was inserted across the fracture site and 4.5 headless cannulated screws were used to stabilize the patella fracture. C-arm floss be verified reduction in both AP and lateral planes. The knee was again irrigated the subcutaneous is closed using 2-0 Vicryl the skin was closed using staples local tissue rearrangement was used to close the traumatic wound which was 3 x 5 cm. Sterile compressive dressings were applied patient was extubated taken to the PACU in stable condition.

## 2015-01-10 NOTE — Progress Notes (Signed)
Patient ID: Diane Norton, female   DOB: 02/27/1986, 29 y.o.   MRN: [id number] Postoperative day 1 intramedullary nailing right femur and open reduction internal fixation right open patella fracture. Patient does complain of pain this morning. Patient is to wear her knee immobilizer at all times to protect the patella. Patient may be touchdown weightbearing on the right lower extremity with therapy. Patient may require discharge to rehabilitation facility.
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
## 2015-01-10 NOTE — Progress Notes (Signed)
Orthopedic Tech Progress Note Patient Details:  Diane Norton 04/14/1985 [id number] Applied Velcro knee immobilizer to RLE. Ortho Devices Type of Ortho Device: Knee Immobilizer Ortho Device/Splint Location: RLE Ortho Device/Splint Interventions: Application   [name], Akanksha Bellmore L 01/10/2015, 2:11 AM
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
## 2015-01-10 NOTE — Care Management (Addendum)
Utilization review completed. Nakoa Ganus, RN Case Manager [phone]. 
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
## 2015-01-10 NOTE — Transfer of Care (Signed)
Immediate Anesthesia Transfer of Care Note  Patient: Diane Norton  Procedure(s) Performed: Procedure(s): INTRAMEDULLARY (IM) RETROGRADE FEMORAL NAILING (Right) OPEN REDUCTION INTERNAL (ORIF) FIXATION PATELLA (Right)  Patient Location: PACU  Anesthesia Type:General  Level of Consciousness: awake  Airway & Oxygen Therapy: Patient Spontanous Breathing  Post-op Assessment: Report given to RN and Post -op Vital signs reviewed and stable  Post vital signs: Reviewed and stable  Last Vitals:  Filed Vitals:   01/09/15 2230  BP: 105/70  Pulse:   Temp:   Resp: 16    Complications: No apparent anesthesia complications

## 2015-01-10 NOTE — Progress Notes (Signed)
Patient ID: Diane Norton, female   DOB: 10/08/1985, 29 y.o.   MRN: [id number] Right wrist pain with PT, xray shows possible scaphoid fracture, CT of wrist ordered.

## 2015-01-10 NOTE — Anesthesia Postprocedure Evaluation (Signed)
Anesthesia Post Note  Patient: Diane Norton  Procedure(s) Performed: Procedure(s) (LRB): INTRAMEDULLARY (IM) RETROGRADE FEMORAL NAILING; RIGHT (Right) OPEN REDUCTION INTERNAL (ORIF) FIXATION PATELLA; RIGHT (Right)  Anesthesia type: general  Patient location: PACU  Post pain: Pain level controlled  Post assessment: Patient's Cardiovascular Status Stable  Post vital signs: Reviewed and stable  Level of consciousness: sedated  Complications: No apparent anesthesia complications

## 2015-01-11 ENCOUNTER — Encounter (HOSPITAL_COMMUNITY): Payer: Self-pay | Admitting: Orthopedic Surgery

## 2015-01-11 LAB — PROTIME-INR
INR: 1.41 (ref 0.00–1.49)
Prothrombin Time: 17.3 seconds — ABNORMAL HIGH (ref 11.6–15.2)

## 2015-01-11 MED ORDER — OXYCODONE-ACETAMINOPHEN 5-325 MG PO TABS: 1.0000 | ORAL_TABLET | ORAL | Status: DC | PRN

## 2015-01-11 MED ORDER — ASPIRIN EC 325 MG PO TBEC: 325.0000 mg | DELAYED_RELEASE_TABLET | Freq: Every day | ORAL | Status: DC

## 2015-01-11 NOTE — Clinical Social Work Note (Signed)
CSW received request to fax patient's lawyer letter regarding patient being in the hospital and unable to attend her court hearing.  Requested letter faxed to patient's lawyer, CSW to sign off.  Ervin KnackEric R. Merilyn Pagan, MSW, Theresia MajorsLCSWA [phone] 01/11/2015 6:39 PM
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
## 2015-01-11 NOTE — Progress Notes (Signed)
Pt ready for d/c per MD. Cleared by PT, platform walker and 3N1 delivered to pt's room. RN educated pt and family at bedside about discharge teaching and prescriptions, all questions answered. Peripheral IV removed, belongings gathered and sent with family member. Will be wheeled out via volunteer.   Shelbie AmmonsHudson, Tremond Shimabukuro G  01/11/2015 11:08 AM
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
## 2015-01-11 NOTE — Progress Notes (Signed)
Orthopedic Tech Progress Note Patient Details:  [facility] 08/10/1985 [id number]  Casting Type of Cast: Short arm cast Cast Location: Rue Cast Material: Fiberglass Cast Intervention: Application     Irish LackGriffin, Deysi Soldo Ray 01/11/2015, 6:58 AM

## 2015-01-11 NOTE — Clinical Social Work Note (Signed)
CSW received referral for SNF.  Case discussed with case manager and plan is to discharge home.  CSW to sign off please re-consult if social work needs arise.  Bresha Hosack R. Shalika Arntz, MSW, LCSWA [phone]  

## 2015-01-15 ENCOUNTER — Encounter (HOSPITAL_COMMUNITY): Payer: Self-pay | Admitting: Emergency Medicine

## 2015-05-24 ENCOUNTER — Emergency Department (HOSPITAL_COMMUNITY): Admission: EM | Admit: 2015-05-24 | Discharge: 2015-05-25 | Discharge status: Against Medical Advice | Payer: MEDICAID

## 2015-05-24 NOTE — ED Notes (Signed)
Called with no answer 

## 2015-06-06 ENCOUNTER — Ambulatory Visit: Payer: Self-pay | Attending: Orthopedic Surgery
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
## 2016-11-14 ENCOUNTER — Ambulatory Visit: Payer: Self-pay | Admitting: Obstetrics and Gynecology

## 2016-11-21 ENCOUNTER — Encounter: Payer: Self-pay | Admitting: Obstetrics and Gynecology

## 2016-11-21 ENCOUNTER — Ambulatory Visit: Payer: Self-pay | Admitting: Obstetrics and Gynecology

## 2016-11-22 NOTE — Progress Notes (Signed)
Patient did not keep GYN appointment for 11/21/2016.  Cornelia Copa MD Attending [facility] [employer] [facility])

## 2017-10-15 ENCOUNTER — Other Ambulatory Visit (HOSPITAL_COMMUNITY)
Admission: RE | Admit: 2017-10-15 | Discharge: 2017-10-15 | Disposition: A | Discharge status: Home / Self Care | Payer: Self-pay | Source: Ambulatory Visit | Attending: Nurse Practitioner | Admitting: Nurse Practitioner

## 2017-10-15 DIAGNOSIS — R87612 Low grade squamous intraepithelial lesion on cytologic smear of cervix (LGSIL): Secondary | ICD-10-CM | POA: Insufficient documentation

## 2019-01-11 ENCOUNTER — Encounter (HOSPITAL_COMMUNITY): Payer: Self-pay

## 2019-01-11 ENCOUNTER — Emergency Department (HOSPITAL_COMMUNITY)
Admission: EM | Admit: 2019-01-11 | Discharge: 2019-01-11 | Disposition: A | Discharge status: Home / Self Care | Payer: Self-pay | Attending: Emergency Medicine | Admitting: Emergency Medicine

## 2019-01-11 ENCOUNTER — Other Ambulatory Visit: Payer: Self-pay

## 2019-01-11 ENCOUNTER — Emergency Department (HOSPITAL_COMMUNITY): Payer: Self-pay

## 2019-01-11 DIAGNOSIS — F17210 Nicotine dependence, cigarettes, uncomplicated: Secondary | ICD-10-CM | POA: Insufficient documentation

## 2019-01-11 DIAGNOSIS — J45909 Unspecified asthma, uncomplicated: Secondary | ICD-10-CM | POA: Insufficient documentation

## 2019-01-11 DIAGNOSIS — K529 Noninfective gastroenteritis and colitis, unspecified: Secondary | ICD-10-CM | POA: Insufficient documentation

## 2019-01-11 LAB — CBC WITH DIFFERENTIAL/PLATELET
Abs Immature Granulocytes: 0.03 10*3/uL (ref 0.00–0.07)
Basophils Absolute: 0.1 10*3/uL (ref 0.0–0.1)
Basophils Relative: 1 %
Eosinophils Absolute: 0.2 10*3/uL (ref 0.0–0.5)
Eosinophils Relative: 1 %
HCT: 40 % (ref 36.0–46.0)
Hemoglobin: 12.7 g/dL (ref 12.0–15.0)
Immature Granulocytes: 0 %
Lymphocytes Relative: 18 %
Lymphs Abs: 2.6 10*3/uL (ref 0.7–4.0)
MCH: 30.6 pg (ref 26.0–34.0)
MCHC: 31.8 g/dL (ref 30.0–36.0)
MCV: 96.4 fL (ref 80.0–100.0)
Monocytes Absolute: 0.5 10*3/uL (ref 0.1–1.0)
Monocytes Relative: 3 %
Neutro Abs: 10.9 10*3/uL — ABNORMAL HIGH (ref 1.7–7.7)
Neutrophils Relative %: 77 %
Platelets: 366 10*3/uL (ref 150–400)
RBC: 4.15 MIL/uL (ref 3.87–5.11)
RDW: 16.9 % — ABNORMAL HIGH (ref 11.5–15.5)
WBC: 14.3 10*3/uL — ABNORMAL HIGH (ref 4.0–10.5)
nRBC: 0 % (ref 0.0–0.2)

## 2019-01-11 LAB — LIPASE, BLOOD: Lipase: 23 U/L (ref 11–51)

## 2019-01-11 LAB — URINALYSIS, ROUTINE W REFLEX MICROSCOPIC
Bilirubin Urine: NEGATIVE
Glucose, UA: NEGATIVE mg/dL
Hgb urine dipstick: NEGATIVE
Ketones, ur: NEGATIVE mg/dL
Nitrite: NEGATIVE
Protein, ur: NEGATIVE mg/dL
Specific Gravity, Urine: 1.018 (ref 1.005–1.030)
pH: 5 (ref 5.0–8.0)

## 2019-01-11 LAB — COMPREHENSIVE METABOLIC PANEL
ALT: 11 U/L (ref 0–44)
AST: 13 U/L — ABNORMAL LOW (ref 15–41)
Albumin: 3.4 g/dL — ABNORMAL LOW (ref 3.5–5.0)
Alkaline Phosphatase: 55 U/L (ref 38–126)
Anion gap: 8 (ref 5–15)
BUN: 16 mg/dL (ref 6–20)
CO2: 21 mmol/L — ABNORMAL LOW (ref 22–32)
Calcium: 8 mg/dL — ABNORMAL LOW (ref 8.9–10.3)
Chloride: 110 mmol/L (ref 98–111)
Creatinine, Ser: 0.65 mg/dL (ref 0.44–1.00)
GFR calc Af Amer: >60 mL/min (ref >60)
GFR calc non Af Amer: >60 mL/min (ref >60)
Glucose, Bld: 112 mg/dL — ABNORMAL HIGH (ref 70–99)
Potassium: 3 mmol/L — ABNORMAL LOW (ref 3.5–5.1)
Sodium: 139 mmol/L (ref 135–145)
Total Bilirubin: 0.3 mg/dL (ref 0.3–1.2)
Total Protein: 6.5 g/dL (ref 6.5–8.1)

## 2019-01-11 LAB — POC URINE PREG, ED: Preg Test, Ur: NEGATIVE

## 2019-01-11 LAB — RAPID URINE DRUG SCREEN, HOSP PERFORMED
Amphetamines: NOT DETECTED
Barbiturates: NOT DETECTED
Benzodiazepines: NOT DETECTED
Cocaine: NOT DETECTED
Opiates: NOT DETECTED
Tetrahydrocannabinol: NOT DETECTED

## 2019-01-11 MED ORDER — CIPROFLOXACIN IN D5W 400 MG/200ML IV SOLN
400.0000 mg | Freq: Once | INTRAVENOUS | Status: AC
  Administered 2019-01-11: 400 mg via INTRAVENOUS
  Filled 2019-01-11: qty 200

## 2019-01-11 MED ORDER — METRONIDAZOLE IN NACL 5-0.79 MG/ML-% IV SOLN
500.0000 mg | Freq: Once | INTRAVENOUS | Status: AC
  Administered 2019-01-11: 500 mg via INTRAVENOUS
  Filled 2019-01-11: qty 100

## 2019-01-11 MED ORDER — CIPROFLOXACIN IN D5W 200 MG/100ML IV SOLN
500.0000 mg | Freq: Once | INTRAVENOUS | Status: DC
  Filled 2019-01-11: qty 250
  Filled 2019-01-11: qty 300

## 2019-01-11 MED ORDER — METOCLOPRAMIDE HCL 5 MG/ML IJ SOLN
10.0000 mg | Freq: Once | INTRAMUSCULAR | Status: AC
  Administered 2019-01-11: 10 mg via INTRAVENOUS
  Filled 2019-01-11: qty 2

## 2019-01-11 MED ORDER — METRONIDAZOLE IN NACL 5-0.79 MG/ML-% IV SOLN: 500.0000 mg | Freq: Three times a day (TID) | INTRAVENOUS | Status: DC

## 2019-01-11 MED ORDER — IOHEXOL 300 MG/ML  SOLN
100.0000 mL | Freq: Once | INTRAMUSCULAR | Status: AC | PRN
  Administered 2019-01-11: 100 mL via INTRAVENOUS

## 2019-01-11 MED ORDER — SODIUM CHLORIDE 0.9 % IV BOLUS
1000.0000 mL | Freq: Once | INTRAVENOUS | Status: AC
  Administered 2019-01-11: 1000 mL via INTRAVENOUS

## 2019-01-11 MED ORDER — DIPHENHYDRAMINE HCL 50 MG/ML IJ SOLN
25.0000 mg | Freq: Once | INTRAMUSCULAR | Status: AC
  Administered 2019-01-11: 25 mg via INTRAVENOUS
  Filled 2019-01-11: qty 1

## 2019-01-11 MED ORDER — SODIUM CHLORIDE 0.9 % IV SOLN
2.0000 g | INTRAVENOUS | Status: DC
  Administered 2019-01-11: 2 g via INTRAVENOUS
  Filled 2019-01-11: qty 20

## 2019-01-11 MED ORDER — ONDANSETRON HCL 4 MG PO TABS: 4.0000 mg | ORAL_TABLET | Freq: Three times a day (TID) | ORAL | 0 refills | Status: DC | PRN

## 2019-01-11 MED ORDER — CIPROFLOXACIN HCL 500 MG PO TABS: 500.0000 mg | ORAL_TABLET | Freq: Two times a day (BID) | ORAL | 0 refills | Status: DC

## 2019-01-11 MED ORDER — HYDROCODONE-ACETAMINOPHEN 5-325 MG PO TABS: 1.0000 | ORAL_TABLET | Freq: Four times a day (QID) | ORAL | 0 refills | Status: DC | PRN

## 2019-01-11 MED ORDER — METRONIDAZOLE 500 MG PO TABS: 500.0000 mg | ORAL_TABLET | Freq: Three times a day (TID) | ORAL | 0 refills | Status: DC

## 2019-01-11 NOTE — ED Notes (Signed)
ED Provider at bedside. 

## 2019-01-11 NOTE — ED Triage Notes (Signed)
Pt called [facility] earlier yesterday for abdominal pain but thought  Is was acid reflux so decided not to come to ED. Pt called [facility] again later this evening and decided pain had been getting worse and needed to be evaluated. Pt denies diarrhea but states Nausea and vomiting X 2.
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
## 2019-01-11 NOTE — Progress Notes (Signed)
Pharmacy Antibiotic Note  Diane Norton is a 33 y.o. female admitted on 01/11/2019 with diverticulitis/intra abdominal infection.  Pharmacy has been consulted for ceftriaxone and metronidazole dosing.  Plan: ceftriaxone 2gm iv q24h, metronidazole 500mg  iv q8h  Height: 5\' 4"  (162.6 cm) Weight: 130 lb (59 kg) IBW/kg (Calculated) : 54.7  Temp (24hrs), Avg:97.7 F (36.5 C), Min:97.7 F (36.5 C), Max:97.7 F (36.5 C)  Recent Labs  Lab 01/11/19 0427  WBC 14.3*  CREATININE 0.65    Estimated Creatinine Clearance: 86.4 mL/min (by C-G formula based on SCr of 0.65 mg/dL).    Allergies  Allergen Reactions  . Bactrim [Sulfamethoxazole-Trimethoprim] Nausea And Vomiting  . Ciprofloxacin Nausea And Vomiting  . Darvocet [Propoxyphene N-Acetaminophen] Hives  . Other     darvocet  . Penicillins Itching  . Penicillins Hives  . Bactrim [Sulfamethoxazole-Trimethoprim] Rash  . Ciprofloxacin Rash    Antimicrobials this admission: 11/8 ciprofloxacin x 1  11/8 ceftriaxone >>  11/8  Metronidazole >>    Microbiology results: N/A  Thank you for allowing pharmacy to be a part of this patient's care.  Diane Norton 01/11/2019 8:55 AM

## 2019-01-11 NOTE — Discharge Instructions (Addendum)
Drink plenty of fluids, do not eat for the next 24 to 48 hours.  Then follow a bland diet for the next couple days.  Gradually get yourself back to a regular diet.  Avoid anything fried, spicy, or greasy for the next couple weeks.  Take the medication as prescribed.  Return to the emergency department if you get fever, worsening abdominal pain, or have uncontrolled vomiting or diarrhea.  You need to follow-up with a gastroenterologist once you are doing better.  Please call Dr. Roseanne Kaufman office to schedule an appointment in the next couple months.

## 2019-01-11 NOTE — ED Notes (Signed)
Pt reports feet swelling when getting Cipro denies facial swelling and n/v.

## 2019-06-01 ENCOUNTER — Ambulatory Visit: Payer: Self-pay

## 2019-06-05 ENCOUNTER — Ambulatory Visit: Payer: MEDICAID | Attending: Internal Medicine

## 2019-06-05 DIAGNOSIS — Z23 Encounter for immunization: Secondary | ICD-10-CM

## 2019-06-05 NOTE — Progress Notes (Signed)
   Covid-19 Vaccination Clinic  Name:  EKATERINA DENISE    MRN: [id number] DOB: 1985-03-29  06/05/2019  Ms. Bauer was observed post Covid-19 immunization for 15 minutes without incident. She was provided with Vaccine Information Sheet and instruction to access the V-Safe system.   Ms. [place] was instructed to call 911 with any severe reactions post vaccine: Marland Kitchen Difficulty breathing  . Swelling of face and throat  . A fast heartbeat  . A bad rash all over body  . Dizziness and weakness   Immunizations Administered    Name Date Dose VIS Date Route   Pfizer COVID-19 Vaccine 06/05/2019  9:06 AM 0.3 mL 02/13/2019 Intramuscular   Manufacturer: [employer], [employer]   Lot: [id number]   NDC: [address]-0

## 2019-06-30 ENCOUNTER — Ambulatory Visit: Payer: MEDICAID | Attending: Internal Medicine

## 2019-06-30 DIAGNOSIS — Z23 Encounter for immunization: Secondary | ICD-10-CM

## 2019-06-30 NOTE — Progress Notes (Signed)
   Covid-19 Vaccination Clinic  Name:  ARIBELLE MCCOSH    MRN: [id number] DOB: 1985/10/30  06/30/2019  Ms. Ressel was observed post Covid-19 immunization for 15 minutes without incident. She was provided with Vaccine Information Sheet and instruction to access the V-Safe system.   Ms. Dearmond was instructed to call 911 with any severe reactions post vaccine: Marland Kitchen Difficulty breathing  . Swelling of face and throat  . A fast heartbeat  . A bad rash all over body  . Dizziness and weakness   Immunizations Administered    Name Date Dose VIS Date Route   Pfizer COVID-19 Vaccine 06/30/2019  9:45 AM 0.3 mL 04/29/2018 Intramuscular   Manufacturer: [employer], [employer]   Lot: [id number]   NDC: [address]-7

## 2019-10-27 ENCOUNTER — Encounter (HOSPITAL_COMMUNITY): Payer: Self-pay

## 2019-10-27 ENCOUNTER — Emergency Department (HOSPITAL_COMMUNITY): Payer: Medicaid Other

## 2019-10-27 ENCOUNTER — Inpatient Hospital Stay (HOSPITAL_COMMUNITY)
Admission: EM | Admit: 2019-10-27 | Discharge: 2019-11-05 | DRG: 207 | Disposition: A | Discharge status: Home / Self Care | Payer: Medicaid Other | Attending: Family Medicine | Admitting: Family Medicine

## 2019-10-27 ENCOUNTER — Other Ambulatory Visit: Payer: Self-pay

## 2019-10-27 DIAGNOSIS — E781 Pure hyperglyceridemia: Secondary | ICD-10-CM | POA: Diagnosis not present

## 2019-10-27 DIAGNOSIS — D65 Disseminated intravascular coagulation [defibrination syndrome]: Secondary | ICD-10-CM | POA: Diagnosis not present

## 2019-10-27 DIAGNOSIS — J189 Pneumonia, unspecified organism: Secondary | ICD-10-CM | POA: Diagnosis present

## 2019-10-27 DIAGNOSIS — Z86711 Personal history of pulmonary embolism: Secondary | ICD-10-CM | POA: Diagnosis not present

## 2019-10-27 DIAGNOSIS — I2694 Multiple subsegmental pulmonary emboli without acute cor pulmonale: Secondary | ICD-10-CM | POA: Diagnosis present

## 2019-10-27 DIAGNOSIS — J439 Emphysema, unspecified: Secondary | ICD-10-CM | POA: Diagnosis present

## 2019-10-27 DIAGNOSIS — Z7982 Long term (current) use of aspirin: Secondary | ICD-10-CM

## 2019-10-27 DIAGNOSIS — J9601 Acute respiratory failure with hypoxia: Secondary | ICD-10-CM | POA: Diagnosis present

## 2019-10-27 DIAGNOSIS — F410 Panic disorder [episodic paroxysmal anxiety] without agoraphobia: Secondary | ICD-10-CM | POA: Diagnosis present

## 2019-10-27 DIAGNOSIS — Z20822 Contact with and (suspected) exposure to covid-19: Secondary | ICD-10-CM | POA: Diagnosis present

## 2019-10-27 DIAGNOSIS — I2699 Other pulmonary embolism without acute cor pulmonale: Secondary | ICD-10-CM

## 2019-10-27 DIAGNOSIS — T41295A Adverse effect of other general anesthetics, initial encounter: Secondary | ICD-10-CM | POA: Diagnosis not present

## 2019-10-27 DIAGNOSIS — D649 Anemia, unspecified: Secondary | ICD-10-CM | POA: Diagnosis present

## 2019-10-27 DIAGNOSIS — E872 Acidosis: Secondary | ICD-10-CM | POA: Diagnosis present

## 2019-10-27 DIAGNOSIS — F17210 Nicotine dependence, cigarettes, uncomplicated: Secondary | ICD-10-CM | POA: Diagnosis present

## 2019-10-27 DIAGNOSIS — E876 Hypokalemia: Secondary | ICD-10-CM | POA: Diagnosis not present

## 2019-10-27 DIAGNOSIS — Z882 Allergy status to sulfonamides status: Secondary | ICD-10-CM

## 2019-10-27 DIAGNOSIS — I272 Pulmonary hypertension, unspecified: Secondary | ICD-10-CM | POA: Diagnosis present

## 2019-10-27 DIAGNOSIS — R7989 Other specified abnormal findings of blood chemistry: Secondary | ICD-10-CM

## 2019-10-27 DIAGNOSIS — Z4659 Encounter for fitting and adjustment of other gastrointestinal appliance and device: Secondary | ICD-10-CM

## 2019-10-27 DIAGNOSIS — F1120 Opioid dependence, uncomplicated: Secondary | ICD-10-CM | POA: Diagnosis present

## 2019-10-27 DIAGNOSIS — R451 Restlessness and agitation: Secondary | ICD-10-CM | POA: Diagnosis not present

## 2019-10-27 DIAGNOSIS — G9340 Encephalopathy, unspecified: Secondary | ICD-10-CM | POA: Diagnosis not present

## 2019-10-27 DIAGNOSIS — Z880 Allergy status to penicillin: Secondary | ICD-10-CM

## 2019-10-27 DIAGNOSIS — Z9049 Acquired absence of other specified parts of digestive tract: Secondary | ICD-10-CM

## 2019-10-27 DIAGNOSIS — J45901 Unspecified asthma with (acute) exacerbation: Secondary | ICD-10-CM | POA: Diagnosis present

## 2019-10-27 DIAGNOSIS — Z87898 Personal history of other specified conditions: Secondary | ICD-10-CM

## 2019-10-27 DIAGNOSIS — Z9289 Personal history of other medical treatment: Secondary | ICD-10-CM

## 2019-10-27 DIAGNOSIS — R0689 Other abnormalities of breathing: Secondary | ICD-10-CM

## 2019-10-27 DIAGNOSIS — Z881 Allergy status to other antibiotic agents status: Secondary | ICD-10-CM | POA: Diagnosis not present

## 2019-10-27 DIAGNOSIS — J9600 Acute respiratory failure, unspecified whether with hypoxia or hypercapnia: Secondary | ICD-10-CM | POA: Diagnosis present

## 2019-10-27 DIAGNOSIS — J45909 Unspecified asthma, uncomplicated: Secondary | ICD-10-CM | POA: Diagnosis present

## 2019-10-27 DIAGNOSIS — D696 Thrombocytopenia, unspecified: Secondary | ICD-10-CM

## 2019-10-27 LAB — COMPREHENSIVE METABOLIC PANEL
ALT: 12 U/L (ref 0–44)
AST: 26 U/L (ref 15–41)
Albumin: 3 g/dL — ABNORMAL LOW (ref 3.5–5.0)
Alkaline Phosphatase: 127 U/L — ABNORMAL HIGH (ref 38–126)
Anion gap: 13 (ref 5–15)
BUN: 19 mg/dL (ref 6–20)
CO2: 16 mmol/L — ABNORMAL LOW (ref 22–32)
Calcium: 8 mg/dL — ABNORMAL LOW (ref 8.9–10.3)
Chloride: 106 mmol/L (ref 98–111)
Creatinine, Ser: 0.89 mg/dL (ref 0.44–1.00)
GFR calc Af Amer: >60 mL/min (ref >60)
GFR calc non Af Amer: >60 mL/min (ref >60)
Glucose, Bld: 96 mg/dL (ref 70–99)
Potassium: 2.7 mmol/L — CL (ref 3.5–5.1)
Sodium: 135 mmol/L (ref 135–145)
Total Bilirubin: 0.5 mg/dL (ref 0.3–1.2)
Total Protein: 6.3 g/dL — ABNORMAL LOW (ref 6.5–8.1)

## 2019-10-27 LAB — RAPID URINE DRUG SCREEN, HOSP PERFORMED
Amphetamines: NOT DETECTED
Barbiturates: NOT DETECTED
Benzodiazepines: NOT DETECTED
Cocaine: NOT DETECTED
Opiates: POSITIVE — AB
Tetrahydrocannabinol: NOT DETECTED

## 2019-10-27 LAB — CBC WITH DIFFERENTIAL/PLATELET
Abs Immature Granulocytes: 0.05 10*3/uL (ref 0.00–0.07)
Basophils Absolute: 0.1 10*3/uL (ref 0.0–0.1)
Basophils Relative: 0 %
Eosinophils Absolute: 0.1 10*3/uL (ref 0.0–0.5)
Eosinophils Relative: 1 %
HCT: 31.3 % — ABNORMAL LOW (ref 36.0–46.0)
Hemoglobin: 10.3 g/dL — ABNORMAL LOW (ref 12.0–15.0)
Immature Granulocytes: 0 %
Lymphocytes Relative: 5 %
Lymphs Abs: 0.7 10*3/uL (ref 0.7–4.0)
MCH: 27.5 pg (ref 26.0–34.0)
MCHC: 32.9 g/dL (ref 30.0–36.0)
MCV: 83.5 fL (ref 80.0–100.0)
Monocytes Absolute: 0.3 10*3/uL (ref 0.1–1.0)
Monocytes Relative: 2 %
Neutro Abs: 12.7 10*3/uL — ABNORMAL HIGH (ref 1.7–7.7)
Neutrophils Relative %: 92 %
Platelets: 217 10*3/uL (ref 150–400)
RBC: 3.75 MIL/uL — ABNORMAL LOW (ref 3.87–5.11)
RDW: 19.7 % — ABNORMAL HIGH (ref 11.5–15.5)
WBC: 13.8 10*3/uL — ABNORMAL HIGH (ref 4.0–10.5)
nRBC: 0 % (ref 0.0–0.2)

## 2019-10-27 LAB — PROCALCITONIN: Procalcitonin: 2.08 ng/mL

## 2019-10-27 LAB — MRSA PCR SCREENING: MRSA by PCR: NEGATIVE

## 2019-10-27 LAB — FERRITIN: Ferritin: 49 ng/mL (ref 11–307)

## 2019-10-27 LAB — SARS CORONAVIRUS 2 BY RT PCR (HOSPITAL ORDER, PERFORMED IN ~~LOC~~ HOSPITAL LAB)
SARS Coronavirus 2: NEGATIVE
SARS Coronavirus 2: NEGATIVE

## 2019-10-27 LAB — BLOOD GAS, ARTERIAL
Acid-base deficit: 8.2 mmol/L — ABNORMAL HIGH (ref 0.0–2.0)
Bicarbonate: 18.1 mmol/L — ABNORMAL LOW (ref 20.0–28.0)
FIO2: 80
O2 Saturation: 92 %
Patient temperature: 37
pCO2 arterial: 27.7 mmHg — ABNORMAL LOW (ref 32.0–48.0)
pH, Arterial: 7.377 (ref 7.350–7.450)
pO2, Arterial: 71 mmHg — ABNORMAL LOW (ref 83.0–108.0)

## 2019-10-27 LAB — TRIGLYCERIDES: Triglycerides: 140 mg/dL (ref <150)

## 2019-10-27 LAB — PREGNANCY, URINE: Preg Test, Ur: NEGATIVE

## 2019-10-27 LAB — LACTATE DEHYDROGENASE: LDH: 664 U/L — ABNORMAL HIGH (ref 98–192)

## 2019-10-27 LAB — BRAIN NATRIURETIC PEPTIDE: B Natriuretic Peptide: 271 pg/mL — ABNORMAL HIGH (ref 0.0–100.0)

## 2019-10-27 LAB — D-DIMER, QUANTITATIVE: D-Dimer, Quant: >20 ug/mL-FEU — ABNORMAL HIGH (ref 0.00–0.50)

## 2019-10-27 LAB — C-REACTIVE PROTEIN: CRP: 20.7 mg/dL — ABNORMAL HIGH (ref <1.0)

## 2019-10-27 LAB — FIBRINOGEN: Fibrinogen: 594 mg/dL — ABNORMAL HIGH (ref 210–475)

## 2019-10-27 LAB — NA AND K (SODIUM & POTASSIUM), RAND UR
Potassium Urine: 34 mmol/L
Sodium, Ur: 35 mmol/L

## 2019-10-27 LAB — LACTIC ACID, PLASMA: Lactic Acid, Venous: 1.3 mmol/L (ref 0.5–1.9)

## 2019-10-27 LAB — MAGNESIUM: Magnesium: 2 mg/dL (ref 1.7–2.4)

## 2019-10-27 LAB — ETHANOL: Alcohol, Ethyl (B): <10 mg/dL (ref <10)

## 2019-10-27 MED ORDER — ALBUTEROL SULFATE (2.5 MG/3ML) 0.083% IN NEBU: 2.5000 mg | INHALATION_SOLUTION | Freq: Four times a day (QID) | RESPIRATORY_TRACT | Status: DC | PRN

## 2019-10-27 MED ORDER — VANCOMYCIN HCL 1250 MG/250ML IV SOLN
1250.0000 mg | Freq: Once | INTRAVENOUS | Status: AC
  Administered 2019-10-27: 1250 mg via INTRAVENOUS
  Filled 2019-10-27: qty 250

## 2019-10-27 MED ORDER — ENOXAPARIN SODIUM 40 MG/0.4ML ~~LOC~~ SOLN
40.0000 mg | Freq: Once | SUBCUTANEOUS | Status: AC
  Administered 2019-10-27: 40 mg via SUBCUTANEOUS
  Filled 2019-10-27: qty 0.4

## 2019-10-27 MED ORDER — MAGNESIUM SULFATE 2 GM/50ML IV SOLN
2.0000 g | Freq: Once | INTRAVENOUS | Status: AC
  Administered 2019-10-27: 2 g via INTRAVENOUS
  Filled 2019-10-27: qty 50

## 2019-10-27 MED ORDER — ACETAMINOPHEN 325 MG PO TABS
650.0000 mg | ORAL_TABLET | Freq: Four times a day (QID) | ORAL | Status: DC | PRN
  Administered 2019-11-02 – 2019-11-05 (×3): 650 mg via ORAL
  Filled 2019-10-27 (×3): qty 2

## 2019-10-27 MED ORDER — POTASSIUM CHLORIDE 10 MEQ/100ML IV SOLN
10.0000 meq | INTRAVENOUS | Status: AC
  Administered 2019-10-27 (×3): 10 meq via INTRAVENOUS
  Filled 2019-10-27 (×3): qty 100

## 2019-10-27 MED ORDER — ONDANSETRON HCL 4 MG PO TABS: 4.0000 mg | ORAL_TABLET | Freq: Four times a day (QID) | ORAL | Status: DC | PRN

## 2019-10-27 MED ORDER — GUAIFENESIN-DM 100-10 MG/5ML PO SYRP
10.0000 mL | ORAL_SOLUTION | Freq: Three times a day (TID) | ORAL | Status: DC
  Administered 2019-10-27: 10 mL via ORAL
  Filled 2019-10-27 (×2): qty 10

## 2019-10-27 MED ORDER — IOHEXOL 350 MG/ML SOLN
75.0000 mL | Freq: Once | INTRAVENOUS | Status: AC | PRN
  Administered 2019-10-28: 75 mL via INTRAVENOUS

## 2019-10-27 MED ORDER — SODIUM CHLORIDE 0.9 % IV SOLN
1.0000 g | INTRAVENOUS | Status: DC
  Administered 2019-10-28 – 2019-10-29 (×2): 1 g via INTRAVENOUS
  Filled 2019-10-27 (×2): qty 10

## 2019-10-27 MED ORDER — SODIUM CHLORIDE 0.9 % IV SOLN
1.0000 g | Freq: Once | INTRAVENOUS | Status: AC
  Administered 2019-10-27: 1 g via INTRAVENOUS
  Filled 2019-10-27: qty 10

## 2019-10-27 MED ORDER — DEXAMETHASONE SODIUM PHOSPHATE 10 MG/ML IJ SOLN
10.0000 mg | Freq: Once | INTRAMUSCULAR | Status: AC
  Administered 2019-10-27: 10 mg via INTRAVENOUS
  Filled 2019-10-27: qty 1

## 2019-10-27 MED ORDER — VANCOMYCIN HCL IN DEXTROSE 1-5 GM/200ML-% IV SOLN: 1000.0000 mg | INTRAVENOUS | Status: DC

## 2019-10-27 MED ORDER — SODIUM CHLORIDE 0.9 % IV SOLN
1000.0000 mL | INTRAVENOUS | Status: DC
  Administered 2019-10-27: 1000 mL via INTRAVENOUS

## 2019-10-27 MED ORDER — IPRATROPIUM-ALBUTEROL 0.5-2.5 (3) MG/3ML IN SOLN: 3.0000 mL | Freq: Once | RESPIRATORY_TRACT | Status: DC

## 2019-10-27 MED ORDER — AEROCHAMBER PLUS FLO-VU MISC
1.0000 | Freq: Once | Status: AC
  Administered 2019-10-27: 1
  Filled 2019-10-27: qty 1

## 2019-10-27 MED ORDER — SODIUM CHLORIDE 0.9 % IV SOLN
500.0000 mg | Freq: Once | INTRAVENOUS | Status: AC
  Administered 2019-10-27: 500 mg via INTRAVENOUS
  Filled 2019-10-27: qty 500

## 2019-10-27 MED ORDER — ALBUTEROL SULFATE HFA 108 (90 BASE) MCG/ACT IN AERS
2.0000 | INHALATION_SPRAY | Freq: Once | RESPIRATORY_TRACT | Status: AC
  Administered 2019-10-27: 2 via RESPIRATORY_TRACT
  Filled 2019-10-27: qty 6.7

## 2019-10-27 MED ORDER — ONDANSETRON HCL 4 MG/2ML IJ SOLN
4.0000 mg | Freq: Four times a day (QID) | INTRAMUSCULAR | Status: DC | PRN
  Administered 2019-10-28 – 2019-11-04 (×2): 4 mg via INTRAVENOUS
  Filled 2019-10-27 (×2): qty 2

## 2019-10-27 MED ORDER — METHYLPREDNISOLONE SODIUM SUCC 125 MG IJ SOLR
60.0000 mg | Freq: Two times a day (BID) | INTRAMUSCULAR | Status: DC
  Administered 2019-10-28: 60 mg via INTRAVENOUS
  Filled 2019-10-27: qty 2

## 2019-10-27 MED ORDER — ALBUTEROL SULFATE (2.5 MG/3ML) 0.083% IN NEBU
2.5000 mg | INHALATION_SOLUTION | Freq: Four times a day (QID) | RESPIRATORY_TRACT | Status: DC | PRN
  Administered 2019-10-28: 2.5 mg via RESPIRATORY_TRACT

## 2019-10-27 MED ORDER — ENOXAPARIN SODIUM 40 MG/0.4ML ~~LOC~~ SOLN
40.0000 mg | SUBCUTANEOUS | Status: DC
  Administered 2019-10-27: 40 mg via SUBCUTANEOUS
  Filled 2019-10-27: qty 0.4

## 2019-10-27 MED ORDER — SODIUM CHLORIDE 0.9 % IV SOLN
500.0000 mg | INTRAVENOUS | Status: DC
  Administered 2019-10-28: 500 mg via INTRAVENOUS
  Filled 2019-10-27 (×2): qty 500

## 2019-10-27 MED ORDER — POTASSIUM CHLORIDE CRYS ER 20 MEQ PO TBCR
60.0000 meq | EXTENDED_RELEASE_TABLET | Freq: Once | ORAL | Status: AC
  Administered 2019-10-27: 60 meq via ORAL
  Filled 2019-10-27: qty 3

## 2019-10-27 MED ORDER — KCL IN DEXTROSE-NACL 40-5-0.9 MEQ/L-%-% IV SOLN
INTRAVENOUS | Status: DC
  Filled 2019-10-27 (×3): qty 1000

## 2019-10-27 MED ORDER — SODIUM CHLORIDE 0.9 % IV BOLUS
1000.0000 mL | Freq: Once | INTRAVENOUS | Status: AC
  Administered 2019-10-27: 1000 mL via INTRAVENOUS

## 2019-10-27 MED ORDER — ACETAMINOPHEN 650 MG RE SUPP: 650.0000 mg | Freq: Four times a day (QID) | RECTAL | Status: DC | PRN

## 2019-10-27 MED ORDER — ALBUTEROL SULFATE (2.5 MG/3ML) 0.083% IN NEBU
2.5000 mg | INHALATION_SOLUTION | Freq: Four times a day (QID) | RESPIRATORY_TRACT | Status: DC
  Administered 2019-10-27 – 2019-10-28 (×2): 2.5 mg via RESPIRATORY_TRACT
  Filled 2019-10-27 (×2): qty 3

## 2019-10-27 MED ORDER — ALBUTEROL SULFATE HFA 108 (90 BASE) MCG/ACT IN AERS
2.0000 | INHALATION_SPRAY | Freq: Four times a day (QID) | RESPIRATORY_TRACT | Status: DC | PRN
  Administered 2019-10-27: 2 via RESPIRATORY_TRACT

## 2019-10-27 NOTE — ED Provider Notes (Signed)
[place] [facility] Provider Note   CSN: [id number] Arrival date & time: 10/27/19  1339     History Chief Complaint  Patient presents with  . Shortness of Breath    Diane Norton is a 34 y.o. female with past medical history of asthma who is a daily 3 pack a day cigarette smoker recently down to 2 packs a day.  Patient states that over the past 4 days at home she has had symptoms of malaise, fatigue, chills, body aches, progressively worsening shortness of breath.  Today her family member called EMS because she was in respiratory distress. She was found by EMS to have oxygen saturations at 65% on room air. The patient was vaccinated against Covid in march. She denies UL leg swelling, hx of Blood clots. She is currently on 15L via NRB  HPI     Past Medical History:  Diagnosis Date  . Asthma   . Seizures [facility])     Patient Active Problem List   Diagnosis Date Noted  . Femur fracture, right, closed, initial encounter 01/10/2015    Past Surgical History:  Procedure Laterality Date  . CHOLECYSTECTOMY    . FEMUR IM NAIL Right 01/09/2015   Procedure: INTRAMEDULLARY (IM) RETROGRADE FEMORAL NAILING; RIGHT;  Surgeon: Newt Minion, MD;  Location: [facility];  Service: Orthopedics;  Laterality: Right;  . ORIF PATELLA Right 01/09/2015   Procedure: OPEN REDUCTION INTERNAL (ORIF) FIXATION PATELLA; RIGHT;  Surgeon: Newt Minion, MD;  Location: [facility];  Service: Orthopedics;  Laterality: Right;     OB History    Gravida  0   Para  0   Term  0   Preterm  0   AB  0   Living        SAB  0   TAB  0   Ectopic  0   Multiple      Live Births              No family history on file.  Social History   Tobacco Use  . Smoking status: Current Every Day Smoker    Packs/day: 1.00    Types: Cigarettes  . Smokeless tobacco: Never Used  Vaping Use  . Vaping Use: Never used  Substance Use Topics  . Alcohol use: No  . Drug use: No    Home Medications Prior to  Admission medications   Medication Sig Start Date End Date Taking? Authorizing Provider  aspirin EC 325 MG tablet Take 1 tablet (325 mg total) by mouth daily. 01/11/15   Newt Minion, MD  aspirin-acetaminophen-caffeine (EXCEDRIN MIGRAINE) [id number] MG per tablet Take 1 tablet by mouth every 6 (six) hours as needed for headache.    [provider]  cephALEXin (KEFLEX) 500 MG capsule Take 1 capsule (500 mg total) by mouth 4 (four) times daily. 09/27/14   Fransico Meadow, PA-C  ciprofloxacin (CIPRO) 500 MG tablet Take 1 tablet (500 mg total) by mouth 2 (two) times daily. 01/11/19   Rolland Porter, MD  clindamycin (CLEOCIN) 150 MG capsule Take 1 capsule (150 mg total) by mouth every 6 (six) hours. 12/26/14   Evalee Jefferson, PA-C  diphenhydrAMINE (SOMINEX) 25 MG tablet Take 25 mg by mouth at bedtime as needed for sleep.    [provider]  doxycycline (VIBRAMYCIN) 100 MG capsule Take 1 capsule (100 mg total) by mouth 2 (two) times daily. 11/06/14   Ashley Murrain, NP  HYDROcodone-acetaminophen (NORCO/VICODIN) 5-325 MG tablet Take  1 tablet by mouth every 6 (six) hours as needed for moderate pain. 01/11/19   Rolland Porter, MD  metroNIDAZOLE (FLAGYL) 500 MG tablet Take 1 tablet (500 mg total) by mouth 3 (three) times daily. 01/11/19   Rolland Porter, MD  ondansetron (ZOFRAN) 4 MG tablet Take 1 tablet (4 mg total) by mouth every 8 (eight) hours as needed for nausea or vomiting. 01/11/19   Rolland Porter, MD  oxyCODONE-acetaminophen (PERCOCET/ROXICET) 5-325 MG per tablet Take 1-2 tablets by mouth every 6 (six) hours as needed for severe pain. 07/22/14   Horton, Barbette Hair, MD  oxyCODONE-acetaminophen (ROXICET) 5-325 MG tablet Take 1 tablet by mouth every 4 (four) hours as needed for severe pain. 01/11/15   Newt Minion, MD  traMADol (ULTRAM) 50 MG tablet Take 1 tablet (50 mg total) by mouth every 6 (six) hours as needed. 12/26/14   Evalee Jefferson, PA-C    Allergies    Bactrim [sulfamethoxazole-trimethoprim],  Ciprofloxacin, Darvocet [propoxyphene n-acetaminophen], Other, Penicillins, Penicillins, Bactrim [sulfamethoxazole-trimethoprim], and Ciprofloxacin  Review of Systems   Review of Systems Ten systems reviewed and are negative for acute change, except as noted in the HPI.   Physical Exam Updated Vital Signs BP (!) 96/55 (BP Location: Left Arm)   Pulse 90   Temp 98.4 F (36.9 C) (Oral)   Resp (!) 24   Ht _0  (1.626 m)   Wt 54.4 kg   LMP 10/26/2019   SpO2 97%   BMI 20.60 kg/m   Physical Exam Vitals and nursing note reviewed.  Constitutional:      General: She is not in acute distress.    Appearance: She is well-developed. She is not diaphoretic.  HENT:     Head: Normocephalic and atraumatic.  Eyes:     General: No scleral icterus.    Conjunctiva/sclera: Conjunctivae normal.  Cardiovascular:     Rate and Rhythm: Normal rate and regular rhythm.     Heart sounds: Normal heart sounds. No murmur heard.  No friction rub. No gallop.   Pulmonary:     Effort: Pulmonary effort is normal. No respiratory distress.     Breath sounds: Examination of the right-middle field reveals rales. Examination of the left-middle field reveals rales. Examination of the right-lower field reveals rales. Examination of the left-lower field reveals rales. Rales (Diffuse fine crackles middle and lower lung fields) present.  Abdominal:     General: Bowel sounds are normal. There is no distension.     Palpations: Abdomen is soft. There is no mass.     Tenderness: There is no abdominal tenderness. There is no guarding.  Musculoskeletal:     Cervical back: Normal range of motion.  Skin:    General: Skin is warm and dry.  Neurological:     Mental Status: She is alert and oriented to person, place, and time.  Psychiatric:        Behavior: Behavior normal.     ED Results / Procedures / Treatments   Labs (all labs ordered are listed, but only abnormal results are displayed) Labs Reviewed - No data to  display  EKG EKG Interpretation  Date/Time:  Tuesday October 27 2019 13:45:08 EDT Ventricular Rate:  98 PR Interval:    QRS Duration: 98 QT Interval:  358 QTC Calculation: 458 R Axis:   61 Text Interpretation: Sinus rhythm Probable left atrial enlargement RSR' in V1 or V2, right VCD or RVH Borderline ST depression, lateral leads No previous ECGs available Artifact Confirmed by Fredia Sorrow (  [id number]) on 10/27/2019 1:51:46 PM   Radiology No results found.  Procedures .Critical Care Performed by: Margarita Mail, PA-C Authorized by: Margarita Mail, PA-C   Critical care provider statement:    Critical care time (minutes):  60   Critical care was necessary to treat or prevent imminent or life-threatening deterioration of the following conditions:  Respiratory failure   Critical care was time spent personally by me on the following activities:  Discussions with consultants, evaluation of patient's response to treatment, examination of patient, ordering and performing treatments and interventions, ordering and review of laboratory studies, ordering and review of radiographic studies, pulse oximetry, re-evaluation of patient's condition, obtaining history from patient or surrogate and review of old charts   (including critical care time)  Medications Ordered in ED Medications - No data to display  ED Course  I have reviewed the triage vital signs and the nursing notes.  Pertinent labs & imaging results that were available during my care of the patient were reviewed by me and considered in my medical decision making (see chart for details).  Clinical Course as of Oct 27 1453  Wed Oct 28, 2019  1451 SpO2: 100 % [AH]    Clinical Course User Index [AH] Margarita Mail, PA-C   MDM Rules/Calculators/A&P                          This patient complains of sob, this involves an extensive number of treatment options, and is a complaint that carries with it a high risk of complications  and morbidity.  The differential diagnosis includes The emergent differential diagnosis for shortness of breath includes, but is not limited to, Pulmonary edema, bronchoconstriction, Pneumonia, Pulmonary embolism, Pneumotherax/ Hemothorax, Dysrythmia, ACS.    I Ordered, reviewed, and interpreted labs, which included: CBC  With wbc count of 13.8 CMP with hypokalemia at 2.&, low protein levels, and high alk phos Covid test is negative Mag level, blood cultures pending Inflammatory markers elevated. D-dimer> 20  I ordered medication decadron, albuterol, vanc, azithormycin, and ceftriaxone for respiratory failure and multifocal pneumonia I ordered imaging studies which included cxr and CTA PE chest I independently visualized and interpreted imaging which showed BL multifocal pneumonia- patient unable to tolerate CT due to fragile respiratory status Additional history obtained from EMR Previous records obtained and reviewed  I consulted Dr. Denton Brick for admission and discussed lab and imaging findings  Critical interventions: HHFNC up to 45 L/m  After the interventions stated above, I reevaluated the patient and found 02 sats low 90s/high 80s. Patient appears comfortables sitting upright. Speaks in full sentences.  Patient critically ill with Non-covid multifocal pneumonia and acute respiratory failure. She is currently stable on HFNC, but fragile. Work of breathing is mild at this time. Patient will be admitted to the hospitatlist service.  Minna Merritts was evaluated in Emergency Department on 10/28/2019 for the symptoms described in the history of present illness. She was evaluated in the context of the global COVID-19 pandemic, which necessitated consideration that the patient might be at risk for infection with the SARS-CoV-2 virus that causes COVID-19. Institutional protocols and algorithms that pertain to the evaluation of patients at risk for COVID-19 are in a state of rapid change based  on information released by regulatory bodies including the CDC and federal and state organizations. These policies and algorithms were followed during the patient's care in the ED.   Final Clinical Impression(s) / ED Diagnoses Final diagnoses:  None    Rx / DC Orders ED Discharge Orders    None       Margarita Mail, PA-C 10/28/19 1458    Fredia Sorrow, MD 11/04/19 1659

## 2019-10-27 NOTE — ED Triage Notes (Signed)
EMS reports pt c/o sob x 4 days, worse with exertion, possible fevers at times.  Reports smokes 2 packs per day and decreased to 1/2 pack approx 3 days ago.  EMS reports pt was 65% on room air, 85% on 02 at 5 liters, and 98% on NRB.  EMS started 20g in left ac.  Reports history of asthma.  Denies any sick contacts.  Reports had covid vaccine x2 in march.   Also reports dry cough.

## 2019-10-27 NOTE — H&P (Addendum)
History and Physical    Diane Norton:[id number] DOB: 1985/11/15 DOA: 10/27/2019  PCP: Diane Norton, No Pcp Per   Diane Norton coming from: Home  Chief Complaint: Difficulty breathing  HPI: Diane Norton is a 34 y.o. female with medical history significant for asthma, seizures, tobacco abuse.  Diane Norton was brought to the ED via EMS for reports of increasing difficulty breathing over the past 4 days.  She also reports a dry cough.  Also reported possible fevers and chills.  She smokes 2 pack of cigarettes daily. She denies chest pain, no leg swelling, no recent trips.  Diane Norton got Pfizer Covid vaccine 2 doses in March.  No known sick contacts.  No vomiting.  She reports some mild wheezing.  She has never required intubation for asthma before, or been this ill before. On EMS arrival, Diane Norton's O2 sats was 65%, she did not appear to be in any distress.  ED Course: temp 98.4, respiratory rate 16 - 24, heart rate 81 - 96, blood pressure systolic 97-1 08.  O2 sats 70% on 5 L, currently on high flow nasal cough is a 15 L.  Covid test negative.  WBC 13.8.  Potassium 2.7.  Magnesium normal 2.  Normal lactic acid 1.3.  Inflammatory markers checked, D-dimer markedly elevated at greater than 20.  Procalcitonin 2. Portable chest x-ray shows bilateral multifocal pneumonia.  Started on IV vancomycin, ceftriaxone and azithromycin.  Started on IV fluids.  Potassium repleted.  Magnesium given.  CTA chest ordered and pending.  Hospitalist to admit for further evaluation and management.  Review of Systems: As per HPI all other systems reviewed and negative.  Past Medical History:  Diagnosis Date  . Asthma   . Seizures (HCC)     Past Surgical History:  Procedure Laterality Date  . CHOLECYSTECTOMY    . FEMUR IM NAIL Right 01/09/2015   Procedure: INTRAMEDULLARY (IM) RETROGRADE FEMORAL NAILING; RIGHT;  Surgeon: Nadara Mustard, MD;  Location: [facility];  Service: Orthopedics;  Laterality: Right;  . ORIF PATELLA Right  01/09/2015   Procedure: OPEN REDUCTION INTERNAL (ORIF) FIXATION PATELLA; RIGHT;  Surgeon: Nadara Mustard, MD;  Location: [facility];  Service: Orthopedics;  Laterality: Right;     reports that she has been smoking cigarettes. She has been smoking about 1.00 pack per day. She has never used smokeless tobacco. She reports that she does not drink alcohol and does not use drugs.  Allergies  Allergen Reactions  . Bactrim [Sulfamethoxazole-Trimethoprim] Nausea And Vomiting  . Ciprofloxacin Nausea And Vomiting  . Darvocet [Propoxyphene N-Acetaminophen] Hives  . Other     darvocet  . Penicillins Itching  . Penicillins Hives  . Bactrim [Sulfamethoxazole-Trimethoprim] Rash  . Ciprofloxacin Rash   Family history of hypertension.  Prior to Admission medications   Medication Sig Start Date End Date Taking? Authorizing Provider  ibuprofen (ADVIL) 400 MG tablet Take 400 mg by mouth every 6 (six) hours as needed.   Yes [provider]  aspirin EC 325 MG tablet Take 1 tablet (325 mg total) by mouth daily. Diane Norton not taking: Reported on 10/27/2019 01/11/15   Nadara Mustard, MD  cephALEXin (KEFLEX) 500 MG capsule Take 1 capsule (500 mg total) by mouth 4 (four) times daily. Diane Norton not taking: Reported on 10/27/2019 09/27/14   Elson Areas, PA-C  ciprofloxacin (CIPRO) 500 MG tablet Take 1 tablet (500 mg total) by mouth 2 (two) times daily. Diane Norton not taking: Reported on 10/27/2019 01/11/19   Devoria Albe, MD  clindamycin (CLEOCIN) 150 MG capsule Take 1 capsule (150 mg total) by mouth every 6 (six) hours. Diane Norton not taking: Reported on 10/27/2019 12/26/14   Burgess AmorIdol, Julie, PA-C  doxycycline (VIBRAMYCIN) 100 MG capsule Take 1 capsule (100 mg total) by mouth 2 (two) times daily. Diane Norton not taking: Reported on 10/27/2019 11/06/14   Janne NapoleonNeese, Hope M, NP  HYDROcodone-acetaminophen (NORCO/VICODIN) 5-325 MG tablet Take 1 tablet by mouth every 6 (six) hours as needed for moderate pain. Diane Norton not taking: Reported on  10/27/2019 01/11/19   Devoria AlbeKnapp, Iva, MD  metroNIDAZOLE (FLAGYL) 500 MG tablet Take 1 tablet (500 mg total) by mouth 3 (three) times daily. Diane Norton not taking: Reported on 10/27/2019 01/11/19   Devoria AlbeKnapp, Iva, MD  ondansetron (ZOFRAN) 4 MG tablet Take 1 tablet (4 mg total) by mouth every 8 (eight) hours as needed for nausea or vomiting. Diane Norton not taking: Reported on 10/27/2019 01/11/19   Devoria AlbeKnapp, Iva, MD  oxyCODONE-acetaminophen (PERCOCET/ROXICET) 5-325 MG per tablet Take 1-2 tablets by mouth every 6 (six) hours as needed for severe pain. Diane Norton not taking: Reported on 10/27/2019 07/22/14   Horton, Mayer Maskerourtney F, MD  oxyCODONE-acetaminophen (ROXICET) 5-325 MG tablet Take 1 tablet by mouth every 4 (four) hours as needed for severe pain. Diane Norton not taking: Reported on 10/27/2019 01/11/15   Nadara Mustarduda, Marcus V, MD  traMADol (ULTRAM) 50 MG tablet Take 1 tablet (50 mg total) by mouth every 6 (six) hours as needed. Diane Norton not taking: Reported on 10/27/2019 12/26/14   Burgess Amordol, Julie, PA-C    Physical Exam: Vitals:   10/27/19 1430 10/27/19 1500 10/27/19 1530 10/27/19 1600  BP: 108/64 96/62 99/70  97/61  Pulse: 96 81 87 77  Resp: (!) 22 17 17 16   Temp:      TempSrc:      SpO2: 94% 97% (!) 88% (!) 89%  Weight:      Height:        Constitutional: Appears older than stated age, slightly drowsy, but answers questions appropriately. Vitals:   10/27/19 1430 10/27/19 1500 10/27/19 1530 10/27/19 1600  BP: 108/64 96/62 99/70  97/61  Pulse: 96 81 87 77  Resp: (!) 22 17 17 16   Temp:      TempSrc:      SpO2: 94% 97% (!) 88% (!) 89%  Weight:      Height:       Eyes: PERRL, lids and conjunctivae normal ENMT: Mucous membranes are dry  Neck: normal, supple, no masses, no thyromegaly Respiratory: Coarse rhonchi diffusely, no crackles. Mild increased work of breathing, No accessory muscle use.  Cardiovascular: Regular rate and rhythm, no murmurs / rubs / gallops. No extremity edema. 2+ pedal pulses.  Abdomen: no tenderness, no  masses palpated. No hepatosplenomegaly. Bowel sounds positive.  Musculoskeletal: no clubbing / cyanosis. No joint deformity upper and lower extremities. Good ROM, no contractures. Normal muscle tone.  Skin: no rashes, lesions, ulcers. No induration Neurologic:  No apparent cranial frequency, moving extremities spontaneously. Psychiatric:Drowsy, but answering questions appropriately.  Normal judgment and insight. Oriented x 3. Normal mood.   Labs on Admission: I have personally reviewed following labs and imaging studies  CBC: Recent Labs  Lab 10/27/19 1438  WBC 13.8*  NEUTROABS 12.7*  HGB 10.3*  HCT 31.3*  MCV 83.5  PLT 217   Basic Metabolic Panel: Recent Labs  Lab 10/27/19 1438  NA 135  K 2.7*  CL 106  CO2 16*  GLUCOSE 96  BUN 19  CREATININE 0.89  CALCIUM 8.0*  MG 2.0  Liver Function Tests: Recent Labs  Lab 10/27/19 1438  AST 26  ALT 12  ALKPHOS 127*  BILITOT 0.5  PROT 6.3*  ALBUMIN 3.0*   Lipid Profile: Recent Labs    10/27/19 1438  TRIG 140   Anemia Panel: Recent Labs    10/27/19 1438  FERRITIN 49   Urine analysis:    Component Value Date/Time   COLORURINE YELLOW 01/11/2019 0439   APPEARANCEUR HAZY (A) 01/11/2019 0439   LABSPEC 1.018 01/11/2019 0439   PHURINE 5.0 01/11/2019 0439   GLUCOSEU NEGATIVE 01/11/2019 0439   HGBUR NEGATIVE 01/11/2019 0439   BILIRUBINUR NEGATIVE 01/11/2019 0439   KETONESUR NEGATIVE 01/11/2019 0439   PROTEINUR NEGATIVE 01/11/2019 0439   UROBILINOGEN 0.2 09/18/2011 0249   NITRITE NEGATIVE 01/11/2019 0439   LEUKOCYTESUR TRACE (A) 01/11/2019 0439    Radiological Exams on Admission: DG Chest Port 1 View  Result Date: 10/27/2019 CLINICAL DATA:  Shortness of breath. EXAM: PORTABLE CHEST 1 VIEW COMPARISON:  January 09, 2015. FINDINGS: The heart size and mediastinal contours are within normal limits. No pneumothorax or pleural effusion is noted. Multiple ill-defined airspace opacities are noted bilaterally concerning for  multifocal pneumonia. The visualized skeletal structures are unremarkable. IMPRESSION: Bilateral multifocal pneumonia. Electronically Signed   By: Lupita Raider M.D.   On: 10/27/2019 14:27    EKG: Independently reviewed.  Sinus rhythm, QTC 489.  No significant ST or T wave abnormalities.  No prior EKG to compare.  Assessment/Plan Principal Problem:   Acute respiratory failure with hypoxia (HCC) Active Problems:   Asthma   Multifocal pneumonia   Acute respiratory failure with hypoxia- in the ED O2 sats 70% on 5 L, currently on high flow nasal cannula 15 L, sats 88 to 92%.  Clinically Diane Norton appears mildly dyspneic considering the level of hypoxia.  Etiology likely combination of multifocal pneumonia asthma exacerbation.  Diane Norton also smokes 2 packs of cigarettes daily.  D-dimer markedly elevated at greater than 20, likely due to pneumonia.  I explained to Diane Norton that if her breathing gets worse she may need to be intubated, Diane Norton is agreeable. -CTA pending to rule out PE-unable to obtain CT scan respiratory problems, Diane Norton unable to tolerate lying flat, became hypoxic, had a near panic attack.  CT a chest cancelled for now.  Therapeutic dose Lovenox x1 ordered. -Supplemental O2, duo nebs, antibiotics. -Obtain stat ABG- pH of 7.3, PCO2-27, PO2 of 71.  Serum bicarb of 16.  Reflecting non-anion gap metabolic acidosis, with respiratory compensation. -Diane Norton keeps removing her nasal cannula, did not tolerate nonrebreather mask. -Addendum-Diane Norton's increased work of breathing, O2 sat ranging from 85 to 91%, on 15 L nasal cannula, she is appearing exhausted, but she is still awake alert and oriented and able to answer questions. Myself and ED provider talked with Diane Norton at bedside, have explained why she needs to be intubated, but at this time she is declining intubation and wants to be intubated only if she her breathing gets significantly worse and there is no other option.   Multifocal  pneumonia-dyspnea, cough, leukocytosis of 13.8, intermittent tachypnea with respiratory failure. Meets criteria for sepsis. Normal lactic acid 1.3.  COVID-19 test negative.  Diane Norton completed 2 doses of Pfizer vaccine in March.  Procalcitonin of 2.  Portable chest x-ray showing bilateral multifocal pneumonia. -Continue IV antibiotics ceftriaxone and azithromycin -Blood pressure soft, give 1 L bolus, continue IV fluids N/s + 40 KCL 100cc/hr x 15hrs -CMP, CBC in the morning -Follow-up blood cultures obtained in the ED -  Mucolytic's  Hypokalemia-potassium 2.7.  Appears to have borderline hypokalemia at baseline. -Check magnesium -Replete K -Obtain urine potassium  Asthma exacerbation-diffuse rhonchi, no history of intubation. -Albuterol nebulizer as needed and scheduled -Incentive spirometry, flutter valve   History of seizures- Diane Norton states she has not had any seizures of being on seizure medication in several years.  DVT prophylaxis: Lovenox Code Status: Full code Family Communication: None at bedside. Disposition Plan: > 2 days, pending resolution/improvement of respiratory failure and treatment of pneumonia. Consults called: None. Admission status: Inpatient, stepdown I certify that at the point of admission it is my clinical judgment that the Diane Norton will require inpatient hospital care spanning beyond 2 midnights from the point of admission due to high intensity of service, high risk for further deterioration and high frequency of surveillance required. The following factors support the Diane Norton status of inpatient: Respiratory failure requiring close monitoring in the ICU, broad-spectrum antibiotics.   Onnie Boer MD [facility]  10/27/2019, 11:25 PM

## 2019-10-27 NOTE — Progress Notes (Signed)
Pharmacy Antibiotic Note  Diane Norton is a 34 y.o. female admitted on 10/27/2019 with pneumonia.  Pharmacy has been consulted for Vancomycin dosing.  Plan: Vancomycin 1000 mg IV every 24 hours. Expected AUC 422. Monitor labs, c/s, and vanco level as indicated.  Height: 5\' 4"  (162.6 cm) Weight: 54.4 kg (120 lb) IBW/kg (Calculated) : 54.7  Temp (24hrs), Avg:98.4 F (36.9 C), Min:98.4 F (36.9 C), Max:98.4 F (36.9 C)  Recent Labs  Lab 10/27/19 1438  WBC 13.8*  CREATININE 0.89  LATICACIDVEN 1.3    Estimated Creatinine Clearance: 76.5 mL/min (by C-G formula based on SCr of 0.89 mg/dL).    Allergies  Allergen Reactions  . Bactrim [Sulfamethoxazole-Trimethoprim] Nausea And Vomiting  . Ciprofloxacin Nausea And Vomiting  . Darvocet [Propoxyphene N-Acetaminophen] Hives  . Other     darvocet  . Penicillins Itching  . Penicillins Hives  . Bactrim [Sulfamethoxazole-Trimethoprim] Rash  . Ciprofloxacin Rash    Antimicrobials this admission: Vanco 8/24 >>   CTX/Azith 8/24 >.   Microbiology results: 8/24 BCx: pending  8/24 MRSA PCR: pending  Thank you for allowing pharmacy to be a part of this patient's care.  9/24 10/27/2019 4:48 PM

## 2019-10-27 NOTE — ED Notes (Signed)
Pt had two episodes within 5 minutes of O2 dropping down into the 60's while on the 15LPM via HFNC. Pt placed on NRB with HFNC and sats increased to 90's. MD made aware.

## 2019-10-27 NOTE — ED Provider Notes (Signed)
Blood pressure 126/90, pulse 82, temperature 98.4 F (36.9 C), temperature source Oral, resp. rate 19, height 5\' 4"  (1.626 m), weight 54.4 kg, last menstrual period 10/26/2019, SpO2 93 %.  In short, Diane Norton is a 34 y.o. female with a chief complaint of Shortness of Breath .  Refer to the original H&P for additional details.  Patient is boarding in the emergency department with multifocal pneumonia and severe hypoxemia. Covid PCR is negative. I have evaluated the patient on several occasions throughout my shift and spoke with the patient at length along with the admitting physician Dr. 20 at bedside. We discussed her underlying pneumonia and that I am concerned that she could be tiring out. We discussed that early intubation would be preferred as waiting could lead to an abrupt deterioration in her breathing and make intubation at that time more difficult and potentially dangerous. She has not tolerated masks on her face but on high flow nasal cannula she is more comfortable. She has precipitous desaturation with any significant movement. Patient is awake and alert. She is adamant about not being intubated at this time despite our discussion. On my assessment she has capacity to refuse intubation at this time. Her saturations are in the low 90s to occasional high 80s. Patient is able to verbalize our concerns. Will continue supportive care for now and HFNC.     Mariea Clonts, MD 10/27/19 [phone]

## 2019-10-27 NOTE — ED Provider Notes (Signed)
Medical screening examination/treatment/procedure(s) were conducted as a shared visit with non-physician practitioner(s) and myself.  I personally evaluated the patient during the encounter.  EKG Interpretation  Date/Time:  Tuesday October 27 2019 13:45:08 EDT Ventricular Rate:  98 PR Interval:    QRS Duration: 98 QT Interval:  358 QTC Calculation: 458 R Axis:   61 Text Interpretation: Sinus rhythm Probable left atrial enlargement RSR' in V1 or V2, right VCD or RVH Borderline ST depression, lateral leads No previous ECGs available Artifact Confirmed by Vanetta Mulders [phone]) on 10/27/2019 1:51:46 PM   Patient seen by me along with physician assistant.  Patient brought in by EMS.  Patient with increasing shortness of breath 4 days ago worse with exertion some fevers at times.  Patient smokes 2 packs a day.  But had to cut down recently.  EMS noted that patient's room air sats were 65% but she did not appear to be in any distress.  I started her on 5 L and she was 85%.  Started on nonrebreather here and 98%.  Patient has a history of asthma.  Denies any sick contacts.  Patient reports had Covid vaccine x2 in March.  Despite the low oxygen saturations patient very function all could talk well and in no acute distress.  Chest x-ray seems to be consistent with multifocal pneumonia.  Clinically we are highly suspicious of Covid.  Patient is had labs drawn.  Covid testing is pending.  Patient will clearly require admission due to the hypoxia.  For what ever reason.   Patient also seen by respiratory therapy.  We may at some point try some high flow nasal cannula oxygen but for right now we will leave her where she is at.  Lactic acid was not elevated at 1.3.  Little bit of leukocytosis.   CRITICAL CARE Performed by: Vanetta Mulders Total critical care time: 35 minutes Critical care time was exclusive of separately billable procedures and treating other patients. Critical care was necessary to  treat or prevent imminent or life-threatening deterioration. Critical care was time spent personally by me on the following activities: development of treatment plan with patient and/or surrogate as well as nursing, discussions with consultants, evaluation of patient's response to treatment, examination of patient, obtaining history from patient or surrogate, ordering and performing treatments and interventions, ordering and review of laboratory studies, ordering and review of radiographic studies, pulse oximetry and re-evaluation of patient's condition.    Vanetta Mulders, MD 10/27/19 1520

## 2019-10-27 NOTE — ED Notes (Signed)
CRITICAL VALUE ALERT  Critical Value:  Potassium 2.7  Date & Time Notied:  10/27/2019 1532  Provider Notified: Arthor Captain PA-C  Orders Received/Actions taken: no/na

## 2019-10-27 NOTE — Progress Notes (Signed)
Pt has gotten 1 dose of lovenox 40mg  for DVT prophylaxis. Ddimer>20 so she is being r/o for PE in AM. Lovenox was ordered as full dose x1.  Lovenox 40mg  SQ x1 in addition to the dose tonight.  F/u with CT in AM  , PharmD, BCIDP, AAHIVP, CPP Infectious Disease Pharmacist 10/27/2019 10:02 PM

## 2019-10-28 ENCOUNTER — Inpatient Hospital Stay (HOSPITAL_COMMUNITY): Payer: Medicaid Other

## 2019-10-28 ENCOUNTER — Encounter (HOSPITAL_COMMUNITY): Payer: Self-pay | Admitting: Internal Medicine

## 2019-10-28 ENCOUNTER — Other Ambulatory Visit: Payer: Self-pay

## 2019-10-28 DIAGNOSIS — J9601 Acute respiratory failure with hypoxia: Secondary | ICD-10-CM

## 2019-10-28 DIAGNOSIS — J189 Pneumonia, unspecified organism: Secondary | ICD-10-CM | POA: Diagnosis present

## 2019-10-28 DIAGNOSIS — J4552 Severe persistent asthma with status asthmaticus: Secondary | ICD-10-CM

## 2019-10-28 LAB — COMPREHENSIVE METABOLIC PANEL
ALT: 16 U/L (ref 0–44)
ALT: 15 U/L (ref 0–44)
AST: 26 U/L (ref 15–41)
AST: 22 U/L (ref 15–41)
Albumin: 2.9 g/dL — ABNORMAL LOW (ref 3.5–5.0)
Albumin: 2.7 g/dL — ABNORMAL LOW (ref 3.5–5.0)
Alkaline Phosphatase: 162 U/L — ABNORMAL HIGH (ref 38–126)
Alkaline Phosphatase: 150 U/L — ABNORMAL HIGH (ref 38–126)
Anion gap: 11 (ref 5–15)
Anion gap: 10 (ref 5–15)
BUN: 18 mg/dL (ref 6–20)
BUN: 18 mg/dL (ref 6–20)
CO2: 15 mmol/L — ABNORMAL LOW (ref 22–32)
CO2: 15 mmol/L — ABNORMAL LOW (ref 22–32)
Calcium: 7.7 mg/dL — ABNORMAL LOW (ref 8.9–10.3)
Calcium: 7.8 mg/dL — ABNORMAL LOW (ref 8.9–10.3)
Chloride: 112 mmol/L — ABNORMAL HIGH (ref 98–111)
Chloride: 112 mmol/L — ABNORMAL HIGH (ref 98–111)
Creatinine, Ser: 0.66 mg/dL (ref 0.44–1.00)
Creatinine, Ser: 0.65 mg/dL (ref 0.44–1.00)
GFR calc Af Amer: >60 mL/min (ref >60)
GFR calc Af Amer: >60 mL/min (ref >60)
GFR calc non Af Amer: >60 mL/min (ref >60)
GFR calc non Af Amer: >60 mL/min (ref >60)
Glucose, Bld: 125 mg/dL — ABNORMAL HIGH (ref 70–99)
Glucose, Bld: 121 mg/dL — ABNORMAL HIGH (ref 70–99)
Potassium: 4 mmol/L (ref 3.5–5.1)
Potassium: 4.1 mmol/L (ref 3.5–5.1)
Sodium: 138 mmol/L (ref 135–145)
Sodium: 137 mmol/L (ref 135–145)
Total Bilirubin: 0.8 mg/dL (ref 0.3–1.2)
Total Bilirubin: 0.8 mg/dL (ref 0.3–1.2)
Total Protein: 6.2 g/dL — ABNORMAL LOW (ref 6.5–8.1)
Total Protein: 5.7 g/dL — ABNORMAL LOW (ref 6.5–8.1)

## 2019-10-28 LAB — BLOOD GAS, ARTERIAL
Acid-base deficit: 10 mmol/L — ABNORMAL HIGH (ref 0.0–2.0)
Acid-base deficit: 8.9 mmol/L — ABNORMAL HIGH (ref 0.0–2.0)
Acid-base deficit: 9.2 mmol/L — ABNORMAL HIGH (ref 0.0–2.0)
Bicarbonate: 16.9 mmol/L — ABNORMAL LOW (ref 20.0–28.0)
Bicarbonate: 17.9 mmol/L — ABNORMAL LOW (ref 20.0–28.0)
Bicarbonate: 16.8 mmol/L — ABNORMAL LOW (ref 20.0–28.0)
Drawn by: 38235
FIO2: 80
FIO2: 80
FIO2: 60
O2 Saturation: 93.9 %
O2 Saturation: 97.5 %
O2 Saturation: 98 %
Patient temperature: 37
Patient temperature: 37
Patient temperature: 36.9
pCO2 arterial: 25.5 mmHg — ABNORMAL LOW (ref 32.0–48.0)
pCO2 arterial: 23.3 mmHg — ABNORMAL LOW (ref 32.0–48.0)
pCO2 arterial: 42 mmHg (ref 32.0–48.0)
pH, Arterial: 7.368 (ref 7.350–7.450)
pH, Arterial: 7.418 (ref 7.350–7.450)
pH, Arterial: 7.23 — ABNORMAL LOW (ref 7.350–7.450)
pO2, Arterial: 79.1 mmHg — ABNORMAL LOW (ref 83.0–108.0)
pO2, Arterial: 111 mmHg — ABNORMAL HIGH (ref 83.0–108.0)
pO2, Arterial: 185 mmHg — ABNORMAL HIGH (ref 83.0–108.0)

## 2019-10-28 LAB — CBC
HCT: 33.5 % — ABNORMAL LOW (ref 36.0–46.0)
HCT: 32.6 % — ABNORMAL LOW (ref 36.0–46.0)
Hemoglobin: 10.8 g/dL — ABNORMAL LOW (ref 12.0–15.0)
Hemoglobin: 10.6 g/dL — ABNORMAL LOW (ref 12.0–15.0)
MCH: 27.4 pg (ref 26.0–34.0)
MCH: 27.4 pg (ref 26.0–34.0)
MCHC: 32.2 g/dL (ref 30.0–36.0)
MCHC: 32.5 g/dL (ref 30.0–36.0)
MCV: 85 fL (ref 80.0–100.0)
MCV: 84.2 fL (ref 80.0–100.0)
Platelets: 142 10*3/uL — ABNORMAL LOW (ref 150–400)
Platelets: 116 10*3/uL — ABNORMAL LOW (ref 150–400)
RBC: 3.94 MIL/uL (ref 3.87–5.11)
RBC: 3.87 MIL/uL (ref 3.87–5.11)
RDW: 19.9 % — ABNORMAL HIGH (ref 11.5–15.5)
RDW: 20 % — ABNORMAL HIGH (ref 11.5–15.5)
WBC: 8.3 10*3/uL (ref 4.0–10.5)
WBC: 11.8 10*3/uL — ABNORMAL HIGH (ref 4.0–10.5)
nRBC: 0 % (ref 0.0–0.2)
nRBC: 0.2 % (ref 0.0–0.2)

## 2019-10-28 LAB — PHOSPHORUS: Phosphorus: 2.4 mg/dL — ABNORMAL LOW (ref 2.5–4.6)

## 2019-10-28 LAB — ECHOCARDIOGRAM COMPLETE
AR max vel: 1.78 cm2
AV Area VTI: 1.5 cm2
AV Area mean vel: 1.5 cm2
AV Mean grad: 2.7 mmHg
AV Peak grad: 4.9 mmHg
Ao pk vel: 1.11 m/s
Area-P 1/2: 6.96 cm2
Height: 64 in
S' Lateral: 2.98 cm
Weight: 1920 oz

## 2019-10-28 LAB — CBG MONITORING, ED: Glucose-Capillary: 218 mg/dL — ABNORMAL HIGH (ref 70–99)

## 2019-10-28 LAB — STREP PNEUMONIAE URINARY ANTIGEN: Strep Pneumo Urinary Antigen: NEGATIVE

## 2019-10-28 LAB — HIV ANTIBODY (ROUTINE TESTING W REFLEX): HIV Screen 4th Generation wRfx: NONREACTIVE

## 2019-10-28 LAB — GLUCOSE, CAPILLARY
Glucose-Capillary: 141 mg/dL — ABNORMAL HIGH (ref 70–99)
Glucose-Capillary: 167 mg/dL — ABNORMAL HIGH (ref 70–99)

## 2019-10-28 LAB — MAGNESIUM: Magnesium: 2.6 mg/dL — ABNORMAL HIGH (ref 1.7–2.4)

## 2019-10-28 MED ORDER — CHLORHEXIDINE GLUCONATE 0.12% ORAL RINSE (MEDLINE KIT)
15.0000 mL | Freq: Two times a day (BID) | OROMUCOSAL | Status: DC
  Administered 2019-10-28 – 2019-11-04 (×15): 15 mL via OROMUCOSAL

## 2019-10-28 MED ORDER — LORAZEPAM 2 MG/ML IJ SOLN
2.0000 mg | INTRAMUSCULAR | Status: DC | PRN
  Administered 2019-10-28 – 2019-11-02 (×17): 2 mg via INTRAVENOUS
  Filled 2019-10-28 (×17): qty 1

## 2019-10-28 MED ORDER — ETOMIDATE 2 MG/ML IV SOLN
INTRAVENOUS | Status: AC | PRN
  Administered 2019-10-28: 15 mg via INTRAVENOUS

## 2019-10-28 MED ORDER — DEXTROSE-NACL 5-0.45 % IV SOLN: INTRAVENOUS | Status: DC

## 2019-10-28 MED ORDER — SODIUM CHLORIDE 0.9 % IV BOLUS
500.0000 mL | Freq: Once | INTRAVENOUS | Status: AC
  Administered 2019-10-28: 500 mL via INTRAVENOUS

## 2019-10-28 MED ORDER — IPRATROPIUM-ALBUTEROL 0.5-2.5 (3) MG/3ML IN SOLN
3.0000 mL | RESPIRATORY_TRACT | Status: DC
  Administered 2019-10-28 – 2019-11-03 (×36): 3 mL via RESPIRATORY_TRACT
  Filled 2019-10-28 (×35): qty 3

## 2019-10-28 MED ORDER — HEPARIN BOLUS VIA INFUSION
2000.0000 [IU] | Freq: Once | INTRAVENOUS | Status: AC
  Administered 2019-10-28: 2000 [IU] via INTRAVENOUS
  Filled 2019-10-28: qty 2000

## 2019-10-28 MED ORDER — CHLORHEXIDINE GLUCONATE CLOTH 2 % EX PADS
6.0000 | MEDICATED_PAD | Freq: Every day | CUTANEOUS | Status: DC
  Administered 2019-10-28 – 2019-11-05 (×8): 6 via TOPICAL

## 2019-10-28 MED ORDER — METHYLPREDNISOLONE SODIUM SUCC 40 MG IJ SOLR
40.0000 mg | Freq: Two times a day (BID) | INTRAMUSCULAR | Status: DC
Start: 2019-10-28 — End: 2019-10-28

## 2019-10-28 MED ORDER — ROCURONIUM BROMIDE 50 MG/5ML IV SOLN
INTRAVENOUS | Status: AC | PRN
  Administered 2019-10-28: 50 mg via INTRAVENOUS

## 2019-10-28 MED ORDER — METHYLPREDNISOLONE SODIUM SUCC 40 MG IJ SOLR
40.0000 mg | Freq: Three times a day (TID) | INTRAMUSCULAR | Status: DC
  Administered 2019-10-28 – 2019-10-29 (×3): 40 mg via INTRAVENOUS
  Filled 2019-10-28 (×3): qty 1

## 2019-10-28 MED ORDER — DOCUSATE SODIUM 50 MG/5ML PO LIQD
100.0000 mg | Freq: Two times a day (BID) | ORAL | Status: DC
  Administered 2019-10-28 – 2019-10-30 (×4): 100 mg via ORAL
  Filled 2019-10-28 (×11): qty 10

## 2019-10-28 MED ORDER — HYDROMORPHONE HCL 1 MG/ML IJ SOLN
1.0000 mg | INTRAMUSCULAR | Status: DC | PRN
  Administered 2019-10-29 – 2019-10-30 (×2): 1 mg via INTRAVENOUS
  Administered 2019-10-30: 3 mg via INTRAVENOUS
  Administered 2019-10-30 (×2): 1 mg via INTRAVENOUS
  Administered 2019-10-31 (×2): 2 mg via INTRAVENOUS
  Administered 2019-11-02: 3 mg via INTRAVENOUS
  Filled 2019-10-28: qty 1
  Filled 2019-10-28: qty 2
  Filled 2019-10-28: qty 1
  Filled 2019-10-28: qty 3
  Filled 2019-10-28: qty 2
  Filled 2019-10-28: qty 3

## 2019-10-28 MED ORDER — PROPOFOL 1000 MG/100ML IV EMUL
0.0000 ug/kg/min | INTRAVENOUS | Status: DC
  Administered 2019-10-28: 5 ug/kg/min via INTRAVENOUS
  Administered 2019-10-28 – 2019-10-31 (×11): 50 ug/kg/min via INTRAVENOUS
  Filled 2019-10-28 (×13): qty 100

## 2019-10-28 MED ORDER — HEPARIN (PORCINE) 25000 UT/250ML-% IV SOLN
1150.0000 [IU]/h | INTRAVENOUS | Status: DC
  Administered 2019-10-28: 900 [IU]/h via INTRAVENOUS
  Filled 2019-10-28: qty 250

## 2019-10-28 MED ORDER — LORAZEPAM 2 MG/ML IJ SOLN
1.0000 mg | INTRAMUSCULAR | Status: DC | PRN
  Administered 2019-10-28: 1 mg via INTRAVENOUS
  Filled 2019-10-28: qty 1

## 2019-10-28 MED ORDER — HYDROMORPHONE HCL 1 MG/ML IJ SOLN
1.0000 mg | INTRAMUSCULAR | Status: AC | PRN
  Administered 2019-10-28 (×3): 1 mg via INTRAVENOUS
  Filled 2019-10-28 (×3): qty 1

## 2019-10-28 MED ORDER — ROCURONIUM BROMIDE 50 MG/5ML IV SOLN
50.0000 mg | Freq: Once | INTRAVENOUS | Status: DC
  Filled 2019-10-28: qty 5

## 2019-10-28 MED ORDER — LORAZEPAM 2 MG/ML IJ SOLN
0.0000 mg | Freq: Four times a day (QID) | INTRAMUSCULAR | Status: DC
  Administered 2019-10-28: 1 mg via INTRAVENOUS
  Filled 2019-10-28: qty 1

## 2019-10-28 MED ORDER — SODIUM CHLORIDE 0.9 % IV SOLN: Freq: Once | INTRAVENOUS | Status: AC

## 2019-10-28 MED ORDER — ALBUTEROL SULFATE HFA 108 (90 BASE) MCG/ACT IN AERS: 2.0000 | INHALATION_SPRAY | Freq: Four times a day (QID) | RESPIRATORY_TRACT | Status: DC

## 2019-10-28 MED ORDER — PANTOPRAZOLE SODIUM 40 MG IV SOLR
40.0000 mg | INTRAVENOUS | Status: DC
  Administered 2019-10-28 – 2019-10-30 (×3): 40 mg via INTRAVENOUS
  Filled 2019-10-28 (×3): qty 40

## 2019-10-28 MED ORDER — LORAZEPAM 2 MG/ML IJ SOLN: 0.0000 mg | Freq: Two times a day (BID) | INTRAMUSCULAR | Status: DC

## 2019-10-28 MED ORDER — THIAMINE HCL 100 MG/ML IJ SOLN
100.0000 mg | Freq: Every day | INTRAMUSCULAR | Status: DC
  Administered 2019-10-28 – 2019-11-03 (×4): 100 mg via INTRAVENOUS
  Filled 2019-10-28 (×4): qty 2

## 2019-10-28 MED ORDER — ETOMIDATE 2 MG/ML IV SOLN: 15.0000 mg | Freq: Once | INTRAVENOUS | Status: DC

## 2019-10-28 MED ORDER — ADULT MULTIVITAMIN W/MINERALS CH
1.0000 | ORAL_TABLET | Freq: Every day | ORAL | Status: DC
  Administered 2019-10-29 – 2019-11-05 (×8): 1 via ORAL
  Filled 2019-10-28 (×8): qty 1

## 2019-10-28 MED ORDER — ORAL CARE MOUTH RINSE
15.0000 mL | OROMUCOSAL | Status: DC
  Administered 2019-10-28 – 2019-11-03 (×60): 15 mL via OROMUCOSAL

## 2019-10-28 MED ORDER — LORAZEPAM 1 MG PO TABS: 1.0000 mg | ORAL_TABLET | ORAL | Status: DC | PRN

## 2019-10-28 MED ORDER — ENOXAPARIN SODIUM 60 MG/0.6ML ~~LOC~~ SOLN: 55.0000 mg | Freq: Two times a day (BID) | SUBCUTANEOUS | Status: DC

## 2019-10-28 MED ORDER — HALOPERIDOL LACTATE 5 MG/ML IJ SOLN
2.0000 mg | Freq: Once | INTRAMUSCULAR | Status: AC
  Administered 2019-10-28: 2 mg via INTRAVENOUS
  Filled 2019-10-28: qty 1

## 2019-10-28 MED ORDER — FOLIC ACID 1 MG PO TABS
1.0000 mg | ORAL_TABLET | Freq: Every day | ORAL | Status: DC
  Administered 2019-10-29 – 2019-11-05 (×8): 1 mg via ORAL
  Filled 2019-10-28 (×8): qty 1

## 2019-10-28 MED ORDER — THIAMINE HCL 100 MG PO TABS
100.0000 mg | ORAL_TABLET | Freq: Every day | ORAL | Status: DC
  Administered 2019-10-30 – 2019-11-05 (×5): 100 mg via ORAL
  Filled 2019-10-28 (×5): qty 1

## 2019-10-28 NOTE — ED Notes (Signed)
Pt stating she wants to leave AMA. Dr. Carren Rang asked to come see pt via Epic chat. Orderd an ABG.

## 2019-10-28 NOTE — ED Notes (Addendum)
Pt admitted to using heroin, COWS score done. MD made aware of emesis occurences and COWS score.

## 2019-10-28 NOTE — Progress Notes (Addendum)
ANTICOAGULATION CONSULT NOTE - Initial Consult  Pharmacy Consult for enoxaparin Indication: pulmonary embolus  Allergies  Allergen Reactions  . Bactrim [Sulfamethoxazole-Trimethoprim] Nausea And Vomiting  . Ciprofloxacin Nausea And Vomiting  . Darvocet [Propoxyphene N-Acetaminophen] Hives  . Other     darvocet  . Penicillins Itching  . Penicillins Hives  . Bactrim [Sulfamethoxazole-Trimethoprim] Rash  . Ciprofloxacin Rash    Patient Measurements: Height: 5\' 4"  (162.6 cm) Weight: 54.4 kg (120 lb) IBW/kg (Calculated) : 54.7 Heparin Dosing Weight: 54kg  Vital Signs: Temp: 98.4 F (36.9 C) (08/25 0725) Temp Source: Oral (08/25 0725) BP: 130/94 (08/25 0700) Pulse Rate: 74 (08/25 0700)  Labs: Recent Labs    10/27/19 1438 10/28/19 0331  HGB 10.3* 10.8*  HCT 31.3* 33.5*  PLT 217 142*  CREATININE 0.89 0.66    Estimated Creatinine Clearance: 85.1 mL/min (by C-G formula based on SCr of 0.66 mg/dL).   Medical History: Past Medical History:  Diagnosis Date  . Asthma   . Seizures (HCC)     Medications:  (Not in a hospital admission)  Scheduled:  . albuterol  2.5 mg Nebulization Q6H  . enoxaparin (LOVENOX) injection  55 mg Subcutaneous Q12H  . folic acid  1 mg Oral Daily  . guaiFENesin-dextromethorphan  10 mL Oral Q8H  . LORazepam  0-4 mg Intravenous Q6H   Followed by  . [START ON 10/30/2019] LORazepam  0-4 mg Intravenous Q12H  . methylPREDNISolone (SOLU-MEDROL) injection  60 mg Intravenous Q12H  . multivitamin with minerals  1 tablet Oral Daily  . thiamine  100 mg Oral Daily   Or  . thiamine  100 mg Intravenous Daily   Infusions:  . azithromycin    . cefTRIAXone (ROCEPHIN)  IV    . dextrose 5 % and 0.9 % NaCl with KCl 40 mEq/L 100 mL/hr at 10/28/19 0618   PRN: acetaminophen **OR** acetaminophen, albuterol, iohexol, LORazepam **OR** LORazepam, ondansetron **OR** ondansetron (ZOFRAN) IV Anti-infectives (From admission, onward)   Start     Dose/Rate Route  Frequency Ordered Stop   10/28/19 1800  vancomycin (VANCOCIN) IVPB 1000 mg/200 mL premix  Status:  Discontinued        1,000 mg 200 mL/hr over 60 Minutes Intravenous Every 24 hours 10/27/19 1645 10/27/19 1956   10/28/19 1600  azithromycin (ZITHROMAX) 500 mg in sodium chloride 0.9 % 250 mL IVPB        500 mg 250 mL/hr over 60 Minutes Intravenous Every 24 hours 10/27/19 1956     10/28/19 1600  cefTRIAXone (ROCEPHIN) 1 g in sodium chloride 0.9 % 100 mL IVPB        1 g 200 mL/hr over 30 Minutes Intravenous Every 24 hours 10/27/19 1956     10/27/19 1645  cefTRIAXone (ROCEPHIN) 1 g in sodium chloride 0.9 % 100 mL IVPB        1 g 200 mL/hr over 30 Minutes Intravenous  Once 10/27/19 1630 10/27/19 1826   10/27/19 1645  azithromycin (ZITHROMAX) 500 mg in sodium chloride 0.9 % 250 mL IVPB        500 mg 250 mL/hr over 60 Minutes Intravenous  Once 10/27/19 1630 10/27/19 1957   10/27/19 1645  vancomycin (VANCOREADY) IVPB 1250 mg/250 mL        1,250 mg 166.7 mL/hr over 90 Minutes Intravenous  Once 10/27/19 1638 10/27/19 2015      Assessment: Pt had d-dimer>20 suspicious for a PE. CT in progress. Pharmacy was consulted for treatment of PE following a once 1.5mg /kg  enoxaparin subq dose given at 2230 approx. Pt was not on anticoagulation PTA.   Goal of Therapy:  Monitor platelets by anticoagulation protocol: Yes  Plan:  Lovenox  55mg  SQ Q12H starting at 2200 CBC MWF  Monitor s/sx of bleeding  10/28/2019,8:12 AM   10/30/2019 Iridiana Fonner, PharmD, MBA, BCGP Clinical Pharmacist

## 2019-10-28 NOTE — ED Notes (Signed)
RN accompanied pt to CT scan, pts O2 sats remained in the mid 90s during the procedure

## 2019-10-28 NOTE — Progress Notes (Signed)
*  PRELIMINARY RESULTS* Echocardiogram 2D Echocardiogram has been performed.  Stacey Drain 10/28/2019, 2:15 PM

## 2019-10-28 NOTE — ED Notes (Signed)
Pt remins anxious

## 2019-10-28 NOTE — Progress Notes (Signed)
Nurse notified MD pt's SBP in the 80s with low urine output and dark; order obtained to give NS bolus.

## 2019-10-28 NOTE — Consult Note (Signed)
[name], MRN:  [id number], DOB:  04/16/1985, LOS: 1 ADMISSION DATE:  10/27/2019, CONSULTATION DATE:  8/25 REFERRING MD:  Margot Chimes, CHIEF COMPLAINT:  Acute resp failure   Brief History   34 yowf smoker with h/o asthmawith 4 days of worse sob PTA assoc dry cough ? Fever/multiple chills presented with sats in 60s improved on 02 initially then req intubation 8/25 and PCCM asked to eval at that point   History of present illness   Pt intubated/ hx per [facility] HPI: Diane Norton is a 34 y.o. female with medical history significant for asthma, seizures, tobacco abuse.  Patient was brought to the ED via EMS for reports of increasing difficulty breathing over the past 4 days.  She also reports a dry cough.  Also reported possible fevers and chills.  She smokes 2 pack of cigarettes daily. She denies chest pain, no leg swelling, no recent trips.  Patient got [place] Covid vaccine 2 doses in March.  No known sick contacts.  No vomiting.  She reports some mild wheezing.  She has never required intubation for asthma before, or been this ill before. On EMS arrival, patient's O2 sats was 65%, she did not appear to be in any distress.  ED Course: temp 98.4, respiratory rate 16 - 24, heart rate 81 - 96, blood pressure systolic 23-7 08.  O2 sats 70% on 5 L, currently on high flow nasal cough is a 15 L.  Covid test negative.  WBC 13.8.  Potassium 2.7.  Magnesium normal 2.  Normal lactic acid 1.3.  Inflammatory markers checked, D-dimer markedly elevated at greater than 20.  Procalcitonin 2. Portable chest x-ray shows bilateral multifocal pneumonia.  Started on IV vancomycin, ceftriaxone and azithromycin.  Started on IV fluids.  Potassium repleted.  Magnesium given.  CTA chest ordered and pending.  Hospitalist to admit for further evaluation and management.   Past Medical History  Asthma SZ   Significant Hospital Events     Consults:  PCCM 8/25  Procedures:  Oral ET  8/25 >>>  Significant  Diagnostic Tests:  CTa 8/25 >>> 1. Small nonocclusive pulmonary emboli in the lungs bilaterally, as above. The appearance of the lungs is highly concerning for severe multilobar bilateral pneumonia   Micro Data:  BC x 2  8/24 MRSA PCR  8/24  Covid 19 PCR  8/24  Neg x 2  PCT   8/24    2.08  Urine legionella   8/25 >>> Urine Strep  8/25 >>>    Antimicrobials:  Rocephin 8/24 >>> Zmax 8/24 >>> Vanc 8/24 >>>    Scheduled Meds: . chlorhexidine gluconate (MEDLINE KIT)  15 mL Mouth Rinse BID  . Chlorhexidine Gluconate Cloth  6 each Topical Daily  . docusate  100 mg Oral BID  . etomidate  15 mg Intravenous Once  . folic acid  1 mg Oral Daily  . heparin  2,000 Units Intravenous Once  . ipratropium-albuterol  3 mL Nebulization Q4H  . mouth rinse  15 mL Mouth Rinse 10 times per day  . methylPREDNISolone (SOLU-MEDROL) injection  40 mg Intravenous Q8H  . multivitamin with minerals  1 tablet Oral Daily  . rocuronium  50 mg Intravenous Once  . thiamine  100 mg Oral Daily   Or  . thiamine  100 mg Intravenous Daily   Continuous Infusions: . azithromycin    . cefTRIAXone (ROCEPHIN)  IV    . dextrose 5 % and 0.45% NaCl 125  mL/hr at 10/28/19 1128  . heparin    . propofol (DIPRIVAN) infusion 5 mcg/kg/min (10/28/19 1121)   PRN Meds:.acetaminophen **OR** acetaminophen, HYDROmorphone (DILAUDID) injection, HYDROmorphone (DILAUDID) injection, LORazepam, ondansetron **OR** ondansetron (ZOFRAN) IV     Interim history/subjective:  Sedated/ intubated but breathing well over backup rate and air trapping   Objective   Blood pressure 130/86, pulse (!) 115, temperature 98.4 F (36.9 C), temperature source Oral, resp. rate (!) 36, height 5' 4"  (1.626 m), weight 54.4 kg, last menstrual period 10/26/2019, SpO2 98 %.    Vent Mode: PRVC FiO2 (%):  [60 %-90 %] 60 % Set Rate:  [15 bmp] 15 bmp Vt Set:  [430 mL] 430 mL PEEP:  [5 cmH20] 5 cmH20   Intake/Output Summary (Last 24 hours) at 10/28/2019  1356 Last data filed at 10/28/2019 1252 Gross per 24 hour  Intake 2650 ml  Output 800 ml  Net 1850 ml   Filed Weights   10/27/19 1342  Weight: 54.4 kg    Examination: Tmax 98.3 General: sedated on vent HENT: oral et  Lungs: lung with exp rhonchi bilaterally  Cardiovascular: RRR no s3 /m Abdomen: soft/ benign Extremities: warm s cyanosis/ clubbig  Neuro: sedated     I personally reviewed images and agree with radiology impression as follows:  CXR:   Portable 8/25  ETT and NG tube in good position.  Worsened airspace disease consistent with progressive pneumonia.   Resolved Hospital Problem list     Assessment & Plan:  1)  Acute CAP in pt with Asthma/ active wheezing - agrees with zmax/ rocephin but with MRSA screen neg prob dont' need vanc - see micro/abx flowsheets  - follow PCT  2) Acute vent dep resp failure secondary to cap/asthma >>> airtrapping when breath over 25 so set at 24 and sedate to prevent autopeep/ barotrauma   3) Multiple PE not typically assoc with ards pattern x in pts with collagen vasc dz eg lupus / Antiphospholipid Syndrome (APS)  - check venous dopplers and rx IV hep as planned  Best practice:  Diet:npo Pain/Anxiety/Delirium protocol (if indicated): diprovan VAP protocol (if indicated):  DVT prophylaxis: hep drip GI prophylaxis: ppi Glucose control:  Mobility: bed rest Code Status: full code  Family Communication: per [facility]  Disposition:   Labs   CBC: Recent Labs  Lab 10/27/19 1438 10/28/19 0331 10/28/19 0941  WBC 13.8* 8.3 11.8*  NEUTROABS 12.7*  --   --   HGB 10.3* 10.8* 10.6*  HCT 31.3* 33.5* 32.6*  MCV 83.5 85.0 84.2  PLT 217 142* 116*    Basic Metabolic Panel: Recent Labs  Lab 10/27/19 1438 10/28/19 0331 10/28/19 0941  NA 135 138 137  K 2.7* 4.0 4.1  CL 106 112* 112*  CO2 16* 15* 15*  GLUCOSE 96 125* 121*  BUN 19 18 18   CREATININE 0.89 0.66 0.65  CALCIUM 8.0* 7.7* 7.8*  MG 2.0 2.6*  --   PHOS  --  2.4*   --    GFR: Estimated Creatinine Clearance: 85.1 mL/min (by C-G formula based on SCr of 0.65 mg/dL). Recent Labs  Lab 10/27/19 1438 10/28/19 0331 10/28/19 0941  PROCALCITON 2.08  --   --   WBC 13.8* 8.3 11.8*  LATICACIDVEN 1.3  --   --     Liver Function Tests: Recent Labs  Lab 10/27/19 1438 10/28/19 0331 10/28/19 0941  AST 26 26 22   ALT 12 16 15   ALKPHOS 127* 162* 150*  BILITOT 0.5 0.8 0.8  PROT 6.3* 6.2* 5.7*  ALBUMIN 3.0* 2.9* 2.7*   No results for input(s): LIPASE, AMYLASE in the last 168 hours. No results for input(s): AMMONIA in the last 168 hours.  ABG    Component Value Date/Time   PHART 7.418 10/28/2019 0855   PCO2ART 23.3 (L) 10/28/2019 0855   PO2ART 111 (H) 10/28/2019 0855   HCO3 17.9 (L) 10/28/2019 0855   ACIDBASEDEF 8.9 (H) 10/28/2019 0855   O2SAT 97.5 10/28/2019 0855     Coagulation Profile: No results for input(s): INR, PROTIME in the last 168 hours.  Cardiac Enzymes: No results for input(s): CKTOTAL, CKMB, CKMBINDEX, TROPONINI in the last 168 hours.  HbA1C: No results found for: HGBA1C  CBG: Recent Labs  Lab 10/28/19 1233  GLUCAP 218*       Past Medical History  She,  has a past medical history of Asthma and Seizures ([place]).   Surgical History    Past Surgical History:  Procedure Laterality Date  . CHOLECYSTECTOMY    . FEMUR IM NAIL Right 01/09/2015   Procedure: INTRAMEDULLARY (IM) RETROGRADE FEMORAL NAILING; RIGHT;  Surgeon: Newt Minion, MD;  Location: [facility];  Service: Orthopedics;  Laterality: Right;  . ORIF PATELLA Right 01/09/2015   Procedure: OPEN REDUCTION INTERNAL (ORIF) FIXATION PATELLA; RIGHT;  Surgeon: Newt Minion, MD;  Location: [facility];  Service: Orthopedics;  Laterality: Right;     Social History   reports that she has been smoking cigarettes. She has been smoking about 1.00 pack per day. She has never used smokeless tobacco. She reports that she does not drink alcohol and does not use drugs.   Family History     Her family history is not on file.   Allergies Allergies  Allergen Reactions  . Bactrim [Sulfamethoxazole-Trimethoprim] Nausea And Vomiting  . Ciprofloxacin Nausea And Vomiting  . Darvocet [Propoxyphene N-Acetaminophen] Hives  . Other     darvocet  . Penicillins Itching  . Penicillins Hives  . Bactrim [Sulfamethoxazole-Trimethoprim] Rash  . Ciprofloxacin Rash     Home Medications  Prior to Admission medications  - - NOTE:   Unable to verify as accurately reflecting what pt takes     Medication Sig Start Date End Date Taking? Authorizing Provider  ibuprofen (ADVIL) 400 MG tablet Take 400 mg by mouth every 6 (six) hours as needed.   Yes [provider]  aspirin EC 325 MG tablet Take 1 tablet (325 mg total) by mouth daily. Patient not taking: Reported on 10/27/2019 01/11/15   Newt Minion, MD  cephALEXin (KEFLEX) 500 MG capsule Take 1 capsule (500 mg total) by mouth 4 (four) times daily. Patient not taking: Reported on 10/27/2019 09/27/14   Fransico Meadow, PA-C  ciprofloxacin (CIPRO) 500 MG tablet Take 1 tablet (500 mg total) by mouth 2 (two) times daily. Patient not taking: Reported on 10/27/2019 01/11/19   Rolland Porter, MD  clindamycin (CLEOCIN) 150 MG capsule Take 1 capsule (150 mg total) by mouth every 6 (six) hours. Patient not taking: Reported on 10/27/2019 12/26/14   Evalee Jefferson, PA-C  doxycycline (VIBRAMYCIN) 100 MG capsule Take 1 capsule (100 mg total) by mouth 2 (two) times daily. Patient not taking: Reported on 10/27/2019 11/06/14   Ashley Murrain, NP  HYDROcodone-acetaminophen (NORCO/VICODIN) 5-325 MG tablet Take 1 tablet by mouth every 6 (six) hours as needed for moderate pain. Patient not taking: Reported on 10/27/2019 01/11/19   Rolland Porter, MD  metroNIDAZOLE (FLAGYL) 500 MG tablet Take 1 tablet (500 mg  total) by mouth 3 (three) times daily. Patient not taking: Reported on 10/27/2019 01/11/19   Rolland Porter, MD  ondansetron (ZOFRAN) 4 MG tablet Take 1 tablet (4 mg total) by  mouth every 8 (eight) hours as needed for nausea or vomiting. Patient not taking: Reported on 10/27/2019 01/11/19   Rolland Porter, MD  oxyCODONE-acetaminophen (PERCOCET/ROXICET) 5-325 MG per tablet Take 1-2 tablets by mouth every 6 (six) hours as needed for severe pain. Patient not taking: Reported on 10/27/2019 07/22/14   Horton, Barbette Hair, MD  oxyCODONE-acetaminophen (ROXICET) 5-325 MG tablet Take 1 tablet by mouth every 4 (four) hours as needed for severe pain. Patient not taking: Reported on 10/27/2019 01/11/15   Newt Minion, MD  traMADol (ULTRAM) 50 MG tablet Take 1 tablet (50 mg total) by mouth every 6 (six) hours as needed. Patient not taking: Reported on 10/27/2019 12/26/14   Evalee Jefferson, PA-C    The patient is critically ill with multiple organ systems failure and requires high complexity decision making for assessment and support, frequent evaluation and titration of therapies, application of advanced monitoring technologies and extensive interpretation of multiple databases. Critical Care Time devoted to patient care services described in this note is 60 minutes.    Christinia Gully, MD Pulmonary and [place] [phone]   After 7:00 pm call Elink  [phone]

## 2019-10-28 NOTE — ED Notes (Signed)
Emokpae, MD at bedside 

## 2019-10-28 NOTE — Progress Notes (Signed)
  Spoke with Brother --- Carver Fila at [phone]  -- Brother unable to visit as he was dxed with covid PNA on 10/10/19 and he was just dced from hospital last week  Pt's brother stated that Shonya was not around him recently (not exposed to him while he had covid) --  Spoke with Mother---Ms Charlynne Pander [phone]  Updated Both brother and mother---  Shon Hale, MD

## 2019-10-28 NOTE — Progress Notes (Signed)
Patient Demographics:    Diane Norton, is a 34 y.o. female, DOB - 1985-08-05, HRC:[id number]  Admit date - 10/27/2019   Admitting Physician Lio Wehrly Mariea Clonts, MD  Outpatient Primary MD for the patient is Patient, No Pcp Per  LOS - 1   Chief Complaint  Patient presents with  . Shortness of Breath        Subjective:    Diane Norton today continue to have increasing oxygen requirement, respiratory distress, confusional episodes, restlessness and was poorly cooperative --Respiratory failure and hypoxia worsened, discussed with EDP Dr. Jodi Mourning, decision made to intubate  Assessment  & Plan :    Principal Problem:   Acute respiratory failure with hypoxia [facility]) Active Problems:   Asthma   Multifocal pneumonia   History of seizures   PNA (pneumonia)  Brief summary 34 year old Caucasian female with past medical history relevant for ongoing heroin abuse, 2 pack-a-day smoker with COPD/emphysema, history of underlying asthma, history of seizures with no recent seizure episodes and history of completing COVID-19 vaccination [employer]) in  April 2021 admitted on 10/27/2019 with hypoxic respiratory failure secondary to combination of to bilateral nonocclusive PE, bilateral multifocal pneumonia and COPD exacerbation--- hypoxic respiratory failure worsened, patient was confused and not compliant with oxygen therapy, intubated 10/28/2019   A/p 1)Acute hypoxic respiratory failure----secondary to bilateral pneumonia, COPD/emphysema flareup and bilateral nonocclusive PE---- --Respiratory failure and hypoxia worsened, discussed with EDP Dr. Jodi Mourning, decision made to intubate on 10/28/19 -completed COVID-19 vaccination [employer]) in  April 2021 -COVID-19 test negative x2 this admission  2) bilateral multifocal pneumonia----check Legionella antigen, intubated for hypoxic respiratory failure as above #1 --Bronchodilators,  Rocephin/azithromycin as ordered  3) acute COPD/emphysema exacerbation--- secondary to #2 above, contributing to #1 above --Patient is a 2 pack-a-day smoker -IV Solu-Medrol and bronchodilators as ordered  4) heroin addiction--- patient uses heroin on admission at least $50 worth of heroin on a daily basis, she does not inject apparently she snorts heroin -As needed lorazepam and continuous propofol drip as ordered  5)Bilateral nonocclusive pulmonary embolism--- patient received therapeutic dose of Lovenox on 10/27/2019 --Start IV heparin drip on 10/28/2019 -Echo pending to evaluate for possible right heart strain and assess EF  6)Social/Ethics--attempted to reach patient's mother Ms Loa Socks at [phone]--GN answer), unable to Reach Brother Shenita Trego at [phone] (appears to be wrong number) -- 7) history of seizures--no recent seizures, currently intubated and sedated with propofol along with as needed lorazepam  CRITICAL CARE Performed by: Shon Hale   Total critical care time:  Critical care time was exclusive of separately billable procedures and treating other patients.  -acute hypoxic respiratory failure requiring intubation, sedation, IV antibiotics and IV heparin drip   Critical care was necessary to treat or prevent imminent or life-threatening deterioration.  Critical care was time spent personally by me on the following activities: development of treatment plan with patient and/or surrogate as well as nursing, discussions with consultants, evaluation of patient's response to treatment, examination of patient, obtaining history from patient or surrogate, ordering and performing treatments and interventions, ordering and review of laboratory studies, ordering and review of radiographic studies, pulse oximetry and re-evaluation of patient's condition.   Disposition/Need for in-Hospital Stay- patient unable to be discharged at this time due to ---  acute hypoxic respiratory failure requiring intubation, sedation, IV antibiotics and IV heparin drip  Status is: Inpatient  Remains inpatient appropriate because: acute hypoxic respiratory failure requiring intubation, sedation, IV antibiotics and IV heparin drip   Disposition: The patient is from: Home              Anticipated d/c is to: TBD              Anticipated d/c date is: > 3 days              Patient currently is not medically stable to d/c. Barriers: Not Clinically Stable-  acute hypoxic respiratory failure requiring intubation, sedation, IV antibiotics and IV heparin drip  Code Status : Full  Family Communication:   -  attempted to reach patient's mother Ms Loa Socksina Gwym at [phone]--[phone]--No answer), unable to Reach Brother Kandis BanMikey Brame at [phone] (appears to be wrong number)  Consults  :  PCCM  DVT Prophylaxis  : IV heparin  Lab Results  Component Value Date   PLT 116 (L) 10/28/2019    Inpatient Medications  Scheduled Meds: . albuterol  2 puff Inhalation QID  . docusate  100 mg Oral BID  . etomidate  15 mg Intravenous Once  . folic acid  1 mg Oral Daily  . guaiFENesin-dextromethorphan  10 mL Oral Q8H  . methylPREDNISolone (SOLU-MEDROL) injection  40 mg Intravenous Q8H  . multivitamin with minerals  1 tablet Oral Daily  . rocuronium  50 mg Intravenous Once  . thiamine  100 mg Oral Daily   Or  . thiamine  100 mg Intravenous Daily   Continuous Infusions: . azithromycin    . cefTRIAXone (ROCEPHIN)  IV    . dextrose 5 % and 0.45% NaCl    . heparin    . propofol (DIPRIVAN) infusion    . sodium chloride     PRN Meds:.acetaminophen **OR** acetaminophen, HYDROmorphone (DILAUDID) injection, HYDROmorphone (DILAUDID) injection, LORazepam, ondansetron **OR** ondansetron (ZOFRAN) IV    Anti-infectives (From admission, onward)   Start     Dose/Rate Route Frequency Ordered Stop   10/28/19 1800  vancomycin (VANCOCIN) IVPB 1000 mg/200 mL premix  Status:   Discontinued        1,000 mg 200 mL/hr over 60 Minutes Intravenous Every 24 hours 10/27/19 1645 10/27/19 1956   10/28/19 1600  azithromycin (ZITHROMAX) 500 mg in sodium chloride 0.9 % 250 mL IVPB        500 mg 250 mL/hr over 60 Minutes Intravenous Every 24 hours 10/27/19 1956     10/28/19 1600  cefTRIAXone (ROCEPHIN) 1 g in sodium chloride 0.9 % 100 mL IVPB        1 g 200 mL/hr over 30 Minutes Intravenous Every 24 hours 10/27/19 1956     10/27/19 1645  cefTRIAXone (ROCEPHIN) 1 g in sodium chloride 0.9 % 100 mL IVPB        1 g 200 mL/hr over 30 Minutes Intravenous  Once 10/27/19 1630 10/27/19 1826   10/27/19 1645  azithromycin (ZITHROMAX) 500 mg in sodium chloride 0.9 % 250 mL IVPB        500 mg 250 mL/hr over 60 Minutes Intravenous  Once 10/27/19 1630 10/27/19 1957   10/27/19 1645  vancomycin (VANCOREADY) IVPB 1250 mg/250 mL        1,250 mg 166.7 mL/hr over 90 Minutes Intravenous  Once 10/27/19 1638 10/27/19 2015        Objective:   Vitals:   10/28/19 0915 10/28/19 0945  10/28/19 1045 10/28/19 1101  BP:   116/75 114/81  Pulse: 75 67 81 82  Resp: (!) 38  (!) 26 (!) 30  Temp:      TempSrc:      SpO2: 99% 97% 97% 99%  Weight:      Height:        Wt Readings from Last 3 Encounters:  10/27/19 54.4 kg  01/11/19 59 kg  01/09/15 65.8 kg     Intake/Output Summary (Last 24 hours) at 10/28/2019 1107 Last data filed at 10/27/2019 1826 Gross per 24 hour  Intake 2150 ml  Output --  Net 2150 ml     Physical Exam  Gen:-Initially confused and disoriented, now intubated HEENT:-ET tube  Neck-No JVD,.  Lungs-diminished breath sounds with scattered rhonchi bilaterally CV- S1, S2 normal, regular  Abd-  +ve B.Sounds, Abd Soft, No tenderness,    Extremity/Skin:- No  edema, pedal pulses present Psych-initially confused and disoriented now intubated and sedated  neuro-no new focal deficits--prior to intubation patient was thrashing around   Data Review:   Micro Results Recent  Results (from the past 240 hour(s))  Blood Culture (routine x 2)     Status: None (Preliminary result)   Collection Time: 10/27/19  1:50 PM   Specimen: BLOOD LEFT HAND  Result Value Ref Range Status   Specimen Description BLOOD LEFT HAND  Final   Special Requests   Final    BOTTLES DRAWN AEROBIC AND ANAEROBIC Blood Culture results may not be optimal due to an inadequate volume of blood received in culture bottles   Culture   Final    NO GROWTH < 24 HOURS Performed at [facility], [address], [place], [place] [address]    Report Status PENDING  Incomplete  SARS Coronavirus 2 by RT PCR (hospital order, performed in [facility] hospital lab) Nasopharyngeal Nasopharyngeal Swab     Status: None   Collection Time: 10/27/19  2:14 PM   Specimen: Nasopharyngeal Swab  Result Value Ref Range Status   SARS Coronavirus 2 NEGATIVE NEGATIVE Final    Comment: (NOTE) SARS-CoV-2 target nucleic acids are NOT DETECTED.  The SARS-CoV-2 RNA is generally detectable in upper and lower respiratory specimens during the acute phase of infection. The lowest concentration of SARS-CoV-2 viral copies this assay can detect is 250 copies / mL. A negative result does not preclude SARS-CoV-2 infection and should not be used as the sole basis for treatment or other patient management decisions.  A negative result may occur with improper specimen collection / handling, submission of specimen other than nasopharyngeal swab, presence of viral mutation(s) within the areas targeted by this assay, and inadequate number of viral copies (<250 copies / mL). A negative result must be combined with clinical observations, patient history, and epidemiological information.  Fact Sheet for Patients:   BoilerBrush.com.cy  Fact Sheet for Healthcare Providers: https://pope.com/  This test is not yet approved or  cleared by the [place] FDA and has been authorized for  detection and/or diagnosis of SARS-CoV-2 by FDA under an Emergency Use Authorization (EUA).  This EUA will remain in effect (meaning this test can be used) for the duration of the COVID-19 declaration under Section 564(b)(1) of the Act, 21 U.S.C. section 360bbb-3(b)(1), unless the authorization is terminated or revoked sooner.  Performed at [facility], [address], [place], [place] [address]   Blood Culture (routine x 2)     Status: None (Preliminary result)   Collection Time: 10/27/19  2:39  PM   Specimen: BLOOD RIGHT HAND  Result Value Ref Range Status   Specimen Description BLOOD RIGHT HAND  Final   Special Requests   Final    BOTTLES DRAWN AEROBIC AND ANAEROBIC Blood Culture adequate volume   Culture   Final    NO GROWTH < 24 HOURS Performed at [facility], [address] [place], [place] [address]    Report Status PENDING  Incomplete  MRSA PCR Screening     Status: None   Collection Time: 10/27/19  9:30 PM   Specimen: Nasopharyngeal  Result Value Ref Range Status   MRSA by PCR NEGATIVE NEGATIVE Final    Comment:        The GeneXpert MRSA Assay (FDA approved for NASAL specimens only), is one component of a comprehensive MRSA colonization surveillance program. It is not intended to diagnose MRSA infection nor to guide or monitor treatment for MRSA infections. Performed at [facility], [address], [place], [place] [address]   SARS Coronavirus 2 by RT PCR (hospital order, performed in [facility] hospital lab) Nasopharyngeal     Status: None   Collection Time: 10/27/19  9:32 PM   Specimen: Nasopharyngeal  Result Value Ref Range Status   SARS Coronavirus 2 NEGATIVE NEGATIVE Final    Comment: (NOTE) SARS-CoV-2 target nucleic acids are NOT DETECTED.  The SARS-CoV-2 RNA is generally detectable in upper and lower respiratory specimens during the acute phase of infection. The lowest concentration of SARS-CoV-2 viral copies this assay can detect is 250 copies / mL.  A negative result does not preclude SARS-CoV-2 infection and should not be used as the sole basis for treatment or other patient management decisions.  A negative result may occur with improper specimen collection / handling, submission of specimen other than nasopharyngeal swab, presence of viral mutation(s) within the areas targeted by this assay, and inadequate number of viral copies (<250 copies / mL). A negative result must be combined with clinical observations, patient history, and epidemiological information.  Fact Sheet for Patients:   BoilerBrush.com.cy  Fact Sheet for Healthcare Providers: https://pope.com/  This test is not yet approved or  cleared by the [place] FDA and has been authorized for detection and/or diagnosis of SARS-CoV-2 by FDA under an Emergency Use Authorization (EUA).  This EUA will remain in effect (meaning this test can be used) for the duration of the COVID-19 declaration under Section 564(b)(1) of the Act, 21 U.S.C. section 360bbb-3(b)(1), unless the authorization is terminated or revoked sooner.  Performed at [facility], [address] [place], [place] [address]     Radiology Reports CT Angio Chest PE W and/or Wo Contrast  Result Date: 10/28/2019 CLINICAL DATA:  34 year old female with history of shortness of breath for the past 4 days. Fever. EXAM: CT ANGIOGRAPHY CHEST WITH CONTRAST TECHNIQUE: Multidetector CT imaging of the chest was performed using the standard protocol during bolus administration of intravenous contrast. Multiplanar CT image reconstructions and MIPs were obtained to evaluate the vascular anatomy. CONTRAST:  28mL OMNIPAQUE IOHEXOL 350 MG/ML SOLN COMPARISON:  Chest CT 01/09/2015. FINDINGS: Cardiovascular: Tiny nonobstructive filling defects are noted within subsegmental pulmonary artery branches to the right lower lobe (axial image 194 of series 5) and the left upper lobe (axial  image 155 of series 5). No other larger central, lobar or segmental sized filling defects are noted. Heart size is normal. There is no significant pericardial fluid, thickening or pericardial calcification. No atherosclerotic calcifications in the thoracic aorta or coronary arteries.  Mediastinum/Nodes: No pathologically enlarged mediastinal or hilar lymph nodes. Esophagus is unremarkable in appearance. No axillary lymphadenopathy. Lungs/Pleura: Widespread areas of severe ground-glass attenuation are noted scattered throughout the lungs bilaterally. No pleural effusions. Mild diffuse bronchial wall thickening with moderate centrilobular and paraseptal emphysema. Upper Abdomen: Unremarkable. Musculoskeletal: There are no aggressive appearing lytic or blastic lesions noted in the visualized portions of the skeleton. Review of the MIP images confirms the above findings. IMPRESSION: 1. Small nonocclusive pulmonary emboli in the lungs bilaterally, as above. 2. The appearance of the lungs is highly concerning for severe multilobar bilateral pneumonia, likely from atypical viral etiologies such as COVID infection. 3. Diffuse bronchial wall thickening with moderate centrilobular and paraseptal emphysema; imaging findings suggestive of underlying COPD. These results will be called to the ordering clinician or representative by the Radiologist Assistant, and communication documented in the PACS or [employer]. Emphysema (ICD10-J43.9). Electronically Signed   By: Trudie Reed M.D.   On: 10/28/2019 09:37   DG Chest Port 1 View  Result Date: 10/27/2019 CLINICAL DATA:  Shortness of breath. EXAM: PORTABLE CHEST 1 VIEW COMPARISON:  January 09, 2015. FINDINGS: The heart size and mediastinal contours are within normal limits. No pneumothorax or pleural effusion is noted. Multiple ill-defined airspace opacities are noted bilaterally concerning for multifocal pneumonia. The visualized skeletal structures are unremarkable.  IMPRESSION: Bilateral multifocal pneumonia. Electronically Signed   By: Lupita Raider M.D.   On: 10/27/2019 14:27     CBC Recent Labs  Lab 10/27/19 1438 10/28/19 0331 10/28/19 0941  WBC 13.8* 8.3 11.8*  HGB 10.3* 10.8* 10.6*  HCT 31.3* 33.5* 32.6*  PLT 217 142* 116*  MCV 83.5 85.0 84.2  MCH 27.5 27.4 27.4  MCHC 32.9 32.2 32.5  RDW 19.7* 19.9* 20.0*  LYMPHSABS 0.7  --   --   MONOABS 0.3  --   --   EOSABS 0.1  --   --   BASOSABS 0.1  --   --     Chemistries  Recent Labs  Lab 10/27/19 1438 10/28/19 0331 10/28/19 0941  NA 135 138 137  K 2.7* 4.0 4.1  CL 106 112* 112*  CO2 16* 15* 15*  GLUCOSE 96 125* 121*  BUN 19 18 18   CREATININE 0.89 0.66 0.65  CALCIUM 8.0* 7.7* 7.8*  MG 2.0 2.6*  --   AST 26 26 22   ALT 12 16 15   ALKPHOS 127* 162* 150*  BILITOT 0.5 0.8 0.8   ------------------------------------------------------------------------------------------------------------------ Recent Labs    10/27/19 1438  TRIG 140    No results found for: HGBA1C ------------------------------------------------------------------------------------------------------------------ No results for input(s): TSH, T4TOTAL, T3FREE, THYROIDAB in the last 72 hours.  Invalid input(s): FREET3 ------------------------------------------------------------------------------------------------------------------ Recent Labs    10/27/19 1438  FERRITIN 49    Coagulation profile No results for input(s): INR, PROTIME in the last 168 hours.  Recent Labs    10/27/19 1438  DDIMER >20.00*    Cardiac Enzymes No results for input(s): CKMB, TROPONINI, MYOGLOBIN in the last 168 hours.  Invalid input(s): CK ------------------------------------------------------------------------------------------------------------------    Component Value Date/Time   BNP 271.0 (H) 10/27/2019 1438     10/29/19 M.D on 10/28/2019 at 11:07 AM  Go to [email or web address] - for contact info  [facility]  - Office  [phone]

## 2019-10-28 NOTE — ED Notes (Signed)
Pt appears confused intermittently, unable to state current year or president, pt pulled off oxygen and sats went to the mid 80s, pt yelling and pulling at lines, pt verbally redirected, Zavitz, MD made aware of pt tachycardia and desaturation off of 15L Levan humidified, will continue to monitor, Admitting MD notified

## 2019-10-28 NOTE — ED Notes (Signed)
Admitting MD messaged re: f/u on the plan of care for diagnostic test

## 2019-10-28 NOTE — Progress Notes (Addendum)
ANTICOAGULATION CONSULT NOTE - Initial Consult  Pharmacy Consult for heparin gtt following enoxaparin 1.5mg /kg x 1 dose Indication: pulmonary embolus  Allergies  Allergen Reactions  . Bactrim [Sulfamethoxazole-Trimethoprim] Nausea And Vomiting  . Ciprofloxacin Nausea And Vomiting  . Darvocet [Propoxyphene N-Acetaminophen] Hives  . Other     darvocet  . Penicillins Itching  . Penicillins Hives  . Bactrim [Sulfamethoxazole-Trimethoprim] Rash  . Ciprofloxacin Rash    Patient Measurements: Height: 5\' 4"  (162.6 cm) Weight: 54.4 kg (120 lb) IBW/kg (Calculated) : 54.7 Heparin Dosing Weight: HEPARIN DW (KG): 54.4   Vital Signs: Temp: 98.4 F (36.9 C) (08/25 0725) Temp Source: Oral (08/25 0725) BP: 130/94 (08/25 0700) Pulse Rate: 67 (08/25 0945)  Labs: Recent Labs    10/27/19 1438 10/28/19 0331  HGB 10.3* 10.8*  HCT 31.3* 33.5*  PLT 217 142*  CREATININE 0.89 0.66    Estimated Creatinine Clearance: 85.1 mL/min (by C-G formula based on SCr of 0.66 mg/dL).   Medical History: Past Medical History:  Diagnosis Date  . Asthma   . Seizures (HCC)     Medications:  (Not in a hospital admission)  Scheduled:  . albuterol  2.5 mg Nebulization Q6H  . folic acid  1 mg Oral Daily  . guaiFENesin-dextromethorphan  10 mL Oral Q8H  . LORazepam  0-4 mg Intravenous Q6H   Followed by  . [START ON 10/30/2019] LORazepam  0-4 mg Intravenous Q12H  . methylPREDNISolone (SOLU-MEDROL) injection  40 mg Intravenous Q8H  . multivitamin with minerals  1 tablet Oral Daily  . thiamine  100 mg Oral Daily   Or  . thiamine  100 mg Intravenous Daily   Infusions:  . azithromycin    . cefTRIAXone (ROCEPHIN)  IV    . dextrose 5 % and 0.9 % NaCl with KCl 40 mEq/L 100 mL/hr at 10/28/19 0858  . heparin    . sodium chloride     PRN: acetaminophen **OR** acetaminophen, albuterol, LORazepam **OR** LORazepam, ondansetron **OR** ondansetron (ZOFRAN) IV Anti-infectives (From admission, onward)    Start     Dose/Rate Route Frequency Ordered Stop   10/28/19 1800  vancomycin (VANCOCIN) IVPB 1000 mg/200 mL premix  Status:  Discontinued        1,000 mg 200 mL/hr over 60 Minutes Intravenous Every 24 hours 10/27/19 1645 10/27/19 1956   10/28/19 1600  azithromycin (ZITHROMAX) 500 mg in sodium chloride 0.9 % 250 mL IVPB        500 mg 250 mL/hr over 60 Minutes Intravenous Every 24 hours 10/27/19 1956     10/28/19 1600  cefTRIAXone (ROCEPHIN) 1 g in sodium chloride 0.9 % 100 mL IVPB        1 g 200 mL/hr over 30 Minutes Intravenous Every 24 hours 10/27/19 1956     10/27/19 1645  cefTRIAXone (ROCEPHIN) 1 g in sodium chloride 0.9 % 100 mL IVPB        1 g 200 mL/hr over 30 Minutes Intravenous  Once 10/27/19 1630 10/27/19 1826   10/27/19 1645  azithromycin (ZITHROMAX) 500 mg in sodium chloride 0.9 % 250 mL IVPB        500 mg 250 mL/hr over 60 Minutes Intravenous  Once 10/27/19 1630 10/27/19 1957   10/27/19 1645  vancomycin (VANCOREADY) IVPB 1250 mg/250 mL        1,250 mg 166.7 mL/hr over 90 Minutes Intravenous  Once 10/27/19 1638 10/27/19 2015      Assessment: Diane Norton a 34 y.o. female requires anticoagulation with  a heparin iv infusion for the indication of  pulmonary embolus. Heparin gtt will be started following pharmacy protocol per pharmacy consult. Patient is not on previous oral anticoagulant that will require aPTT/HL correlation before transitioning to only HL monitoring.   Goal of Therapy:  Heparin level 0.3-0.7 units/ml Monitor platelets by anticoagulation protocol: Yes   Plan: Reduced bolus of heparin 2000units iv x 1 at 2200 due to full anticoagulation dose of enoxaparin 1.5mg /kg (80mg ) given 8/24@2230  Start heparin infusion at 800 units/hr @ 2200 (time of next expected enoxaparin dose) Check anti-Xa level in 6 hours and daily while on heparin Continue to monitor H&H and platelets   Yvan Dority 10/28/2019,10:00 AM

## 2019-10-28 NOTE — ED Provider Notes (Addendum)
Observation note  Patient CARE signed out to continue to monitor and reassess until bed available for admission. Nursing staff contacted me as patient more confused than baseline and requiring 15 L humidified oxygenationI   I assessed the patient in the room, patient does have general confusion does not know location, does follow commands at this time.  No fever this morning, patient tachypneic, normal heart rate. Dry mm.  ABG ordered showing elevated oxygen, no evidence of hypercapnia. Initial CT angiogram unsuccessful as patient was too short of breath.  Patient did receive Lovenox.  Repeat CT scan ordered to look for pulmonary embolism with elevated D-dimer and hypoxia.  Covid test negative.  IV fluids. Chest x-ray reviewed showing multifocal pneumonia and antibiotics have been ordered. Patient critical condition will continue to treat and monitor until admission bed available.  Discussed with hospitalist in the emergency room.  Patient CT scan results discussed with radiology and reviewed showing pulmonary emboli without heart strain.  Updated admitting doctor.  Patient clinically continue to worsen more hypoxic, more agitated and confused.  Decision intubate based on hypoxia and airway protection.  Patient does not have decision-making capacity at this time.  Patient intubated and will be admitted to ICU.  Marland KitchenCritical Care Performed by: Blane Ohara, MD Authorized by: Blane Ohara, MD   Critical care provider statement:    Critical care time (minutes):  95   Critical care start time:  10/28/2019 8:25 AM   Critical care end time:  10/28/2019 10:00 AM   Critical care time was exclusive of:  Separately billable procedures and treating other patients and teaching time   Critical care was necessary to treat or prevent imminent or life-threatening deterioration of the following conditions:  Respiratory failure   Critical care was time spent personally by me on the following activities:   Discussions with consultants, evaluation of patient's response to treatment, examination of patient, ordering and performing treatments and interventions, ordering and review of laboratory studies, ordering and review of radiographic studies, pulse oximetry and re-evaluation of patient's condition  Procedure Name: Intubation Date/Time: 10/28/2019 11:36 AM Performed by: Blane Ohara, MD Pre-anesthesia Checklist: Patient identified, Patient being monitored, Emergency Drugs available, Timeout performed and Suction available Oxygen Delivery Method: Non-rebreather mask Preoxygenation: Pre-oxygenation with 100% oxygen Induction Type: Rapid sequence Ventilation: Mask ventilation without difficulty Laryngoscope Size: Glidescope and 4 Grade View: Grade II Tube size: 7.0 mm Number of attempts: 1 Placement Confirmation: ETT inserted through vocal cords under direct vision,  CO2 detector,  Breath sounds checked- equal and bilateral and Positive ETCO2 Secured at: 23 cm Tube secured with: ETT holder      Kenton Kingfisher, MD 10/28/19 4696    Blane Ohara, MD 10/28/19 1138

## 2019-10-29 ENCOUNTER — Inpatient Hospital Stay (HOSPITAL_COMMUNITY): Payer: Medicaid Other

## 2019-10-29 DIAGNOSIS — D65 Disseminated intravascular coagulation [defibrination syndrome]: Secondary | ICD-10-CM | POA: Clinically undetermined

## 2019-10-29 DIAGNOSIS — I2699 Other pulmonary embolism without acute cor pulmonale: Secondary | ICD-10-CM

## 2019-10-29 DIAGNOSIS — D696 Thrombocytopenia, unspecified: Secondary | ICD-10-CM

## 2019-10-29 DIAGNOSIS — J9600 Acute respiratory failure, unspecified whether with hypoxia or hypercapnia: Secondary | ICD-10-CM

## 2019-10-29 LAB — CBC
HCT: 30.2 % — ABNORMAL LOW (ref 36.0–46.0)
HCT: 27.4 % — ABNORMAL LOW (ref 36.0–46.0)
Hemoglobin: 9.7 g/dL — ABNORMAL LOW (ref 12.0–15.0)
Hemoglobin: 8.8 g/dL — ABNORMAL LOW (ref 12.0–15.0)
MCH: 27 pg (ref 26.0–34.0)
MCH: 27.2 pg (ref 26.0–34.0)
MCHC: 32.1 g/dL (ref 30.0–36.0)
MCHC: 32.1 g/dL (ref 30.0–36.0)
MCV: 84.1 fL (ref 80.0–100.0)
MCV: 84.6 fL (ref 80.0–100.0)
Platelets: 39 10*3/uL — ABNORMAL LOW (ref 150–400)
Platelets: 37 10*3/uL — ABNORMAL LOW (ref 150–400)
RBC: 3.59 MIL/uL — ABNORMAL LOW (ref 3.87–5.11)
RBC: 3.24 MIL/uL — ABNORMAL LOW (ref 3.87–5.11)
RDW: 21.6 % — ABNORMAL HIGH (ref 11.5–15.5)
RDW: 21.1 % — ABNORMAL HIGH (ref 11.5–15.5)
WBC: 18 10*3/uL — ABNORMAL HIGH (ref 4.0–10.5)
WBC: 16.4 10*3/uL — ABNORMAL HIGH (ref 4.0–10.5)
nRBC: 0.2 % (ref 0.0–0.2)
nRBC: 0 % (ref 0.0–0.2)

## 2019-10-29 LAB — COMPREHENSIVE METABOLIC PANEL
ALT: 59 U/L — ABNORMAL HIGH (ref 0–44)
AST: 123 U/L — ABNORMAL HIGH (ref 15–41)
Albumin: 2.6 g/dL — ABNORMAL LOW (ref 3.5–5.0)
Alkaline Phosphatase: 130 U/L — ABNORMAL HIGH (ref 38–126)
Anion gap: 8 (ref 5–15)
BUN: 24 mg/dL — ABNORMAL HIGH (ref 6–20)
CO2: 17 mmol/L — ABNORMAL LOW (ref 22–32)
Calcium: 7.7 mg/dL — ABNORMAL LOW (ref 8.9–10.3)
Chloride: 113 mmol/L — ABNORMAL HIGH (ref 98–111)
Creatinine, Ser: 0.71 mg/dL (ref 0.44–1.00)
GFR calc Af Amer: >60 mL/min (ref >60)
GFR calc non Af Amer: >60 mL/min (ref >60)
Glucose, Bld: 150 mg/dL — ABNORMAL HIGH (ref 70–99)
Potassium: 3.9 mmol/L (ref 3.5–5.1)
Sodium: 138 mmol/L (ref 135–145)
Total Bilirubin: 0.6 mg/dL (ref 0.3–1.2)
Total Protein: 5.3 g/dL — ABNORMAL LOW (ref 6.5–8.1)

## 2019-10-29 LAB — DIC (DISSEMINATED INTRAVASCULAR COAGULATION)PANEL
D-Dimer, Quant: >20 ug/mL-FEU — ABNORMAL HIGH (ref 0.00–0.50)
Fibrinogen: 79 mg/dL — CL (ref 210–475)
INR: 1.5 — ABNORMAL HIGH (ref 0.8–1.2)
Platelets: 36 10*3/uL — ABNORMAL LOW (ref 150–400)
Prothrombin Time: 17.6 seconds — ABNORMAL HIGH (ref 11.4–15.2)
aPTT: 52 seconds — ABNORMAL HIGH (ref 24–36)

## 2019-10-29 LAB — GLUCOSE, CAPILLARY
Glucose-Capillary: 120 mg/dL — ABNORMAL HIGH (ref 70–99)
Glucose-Capillary: 146 mg/dL — ABNORMAL HIGH (ref 70–99)
Glucose-Capillary: 146 mg/dL — ABNORMAL HIGH (ref 70–99)
Glucose-Capillary: 150 mg/dL — ABNORMAL HIGH (ref 70–99)
Glucose-Capillary: 156 mg/dL — ABNORMAL HIGH (ref 70–99)

## 2019-10-29 LAB — HEPARIN LEVEL (UNFRACTIONATED)
Heparin Unfractionated: <0.1 IU/mL — ABNORMAL LOW (ref 0.30–0.70)
Heparin Unfractionated: 0.11 IU/mL — ABNORMAL LOW (ref 0.30–0.70)

## 2019-10-29 LAB — BLOOD GAS, ARTERIAL
Acid-base deficit: 6.2 mmol/L — ABNORMAL HIGH (ref 0.0–2.0)
Bicarbonate: 19.5 mmol/L — ABNORMAL LOW (ref 20.0–28.0)
FIO2: 50
O2 Saturation: 97.7 %
Patient temperature: 37
pCO2 arterial: 32.6 mmHg (ref 32.0–48.0)
pH, Arterial: 7.365 (ref 7.350–7.450)
pO2, Arterial: 125 mmHg — ABNORMAL HIGH (ref 83.0–108.0)

## 2019-10-29 LAB — LACTATE DEHYDROGENASE: LDH: 594 U/L — ABNORMAL HIGH (ref 98–192)

## 2019-10-29 LAB — TYPE AND SCREEN
ABO/RH(D): O POS
Antibody Screen: NEGATIVE

## 2019-10-29 LAB — SARS CORONAVIRUS 2 BY RT PCR (HOSPITAL ORDER, PERFORMED IN ~~LOC~~ HOSPITAL LAB): SARS Coronavirus 2: NEGATIVE

## 2019-10-29 LAB — LEGIONELLA PNEUMOPHILA SEROGP 1 UR AG: L. pneumophila Serogp 1 Ur Ag: NEGATIVE

## 2019-10-29 LAB — RETICULOCYTES
Immature Retic Fract: 25.3 % — ABNORMAL HIGH (ref 2.3–15.9)
RBC.: 2.95 MIL/uL — ABNORMAL LOW (ref 3.87–5.11)
Retic Count, Absolute: 43.7 10*3/uL (ref 19.0–186.0)
Retic Ct Pct: 1.5 % (ref 0.4–3.1)

## 2019-10-29 LAB — TRIGLYCERIDES: Triglycerides: 213 mg/dL — ABNORMAL HIGH (ref <150)

## 2019-10-29 LAB — ABO/RH: ABO/RH(D): O POS

## 2019-10-29 LAB — PROCALCITONIN: Procalcitonin: 0.81 ng/mL

## 2019-10-29 MED ORDER — METHYLPREDNISOLONE SODIUM SUCC 40 MG IJ SOLR
40.0000 mg | Freq: Two times a day (BID) | INTRAMUSCULAR | Status: DC
  Administered 2019-10-29 – 2019-10-31 (×4): 40 mg via INTRAVENOUS
  Filled 2019-10-29 (×4): qty 1

## 2019-10-29 MED ORDER — FENTANYL 2500MCG IN NS 250ML (10MCG/ML) PREMIX INFUSION
0.0000 ug/h | INTRAVENOUS | Status: DC
  Administered 2019-10-29: 25 ug/h via INTRAVENOUS
  Administered 2019-10-29: 175 ug/h via INTRAVENOUS
  Administered 2019-10-30 – 2019-10-31 (×3): 200 ug/h via INTRAVENOUS
  Filled 2019-10-29 (×5): qty 250

## 2019-10-29 MED ORDER — HEPARIN BOLUS VIA INFUSION
1500.0000 [IU] | Freq: Once | INTRAVENOUS | Status: AC
  Administered 2019-10-29: 1500 [IU] via INTRAVENOUS
  Filled 2019-10-29: qty 1500

## 2019-10-29 MED ORDER — SODIUM CHLORIDE 0.9% IV SOLUTION: Freq: Once | INTRAVENOUS | Status: AC

## 2019-10-29 MED ORDER — SODIUM CHLORIDE 0.9 % IV SOLN
100.0000 mg | Freq: Two times a day (BID) | INTRAVENOUS | Status: DC
  Administered 2019-10-29 – 2019-11-02 (×8): 100 mg via INTRAVENOUS
  Filled 2019-10-29 (×10): qty 100

## 2019-10-29 MED ORDER — SODIUM CHLORIDE 0.9% FLUSH
3.0000 mL | Freq: Two times a day (BID) | INTRAVENOUS | Status: DC
  Administered 2019-10-29 – 2019-11-05 (×15): 3 mL via INTRAVENOUS

## 2019-10-29 MED ORDER — SODIUM CHLORIDE 0.9 % IV SOLN
1.0000 g | Freq: Three times a day (TID) | INTRAVENOUS | Status: AC
  Administered 2019-10-29 – 2019-11-04 (×19): 1 g via INTRAVENOUS
  Filled 2019-10-29 (×19): qty 1

## 2019-10-29 MED ORDER — SODIUM CHLORIDE 0.9% FLUSH: 3.0000 mL | INTRAVENOUS | Status: DC | PRN

## 2019-10-29 MED ORDER — SODIUM CHLORIDE 0.9 % IV SOLN: 250.0000 mL | INTRAVENOUS | Status: DC | PRN

## 2019-10-29 NOTE — Progress Notes (Signed)
eLink Physician-Brief Progress Note Patient Name: Diane Norton DOB: [birth year] MRN: [id number]   Date of Service  10/29/2019  HPI/Events of Note  CXR reveals: 1. No significant interval change in bilateral mid to lower lung field airspace opacities. 2. Stable support apparatus. AGB o 50%/PRVC 24/TV 430/P 5 = 7.365/32.6/125/19.5  Patient is on Norco and Percocet at home.   eICU Interventions  Plan: 1. Add Fentanyl IV infusion. Titrate to RASS = 0.     Intervention Category Major Interventions: Respiratory failure - evaluation and management  Diane Norton 10/29/2019, 3:54 AM

## 2019-10-29 NOTE — Progress Notes (Signed)
[name], MRN:  [id number], DOB:  [birth year], LOS: 2 ADMISSION DATE:  10/27/2019, CONSULTATION DATE:  8/25 REFERRING MD:  Margot Chimes, CHIEF COMPLAINT:  Acute resp failure   Brief History   70 yowf smoker with h/o asthma with 4 days of worse sob PTA assoc dry cough ? Fever/multiple chills presented with sats in 60s improved on 02 initially then req intubation 8/25 and PCCM asked to eval at that point   History of present illness   Pt intubated/ hx per [facility] HPI: Diane Norton is a 34 y.o. female with medical history significant for asthma, seizures, tobacco abuse.  Patient was brought to the ED via EMS for reports of increasing difficulty breathing over the past 4 days.  She also reports a dry cough.  Also reported possible fevers and chills.  She smokes 2 pack of cigarettes daily. She denies chest pain, no leg swelling, no recent trips.  Patient got [place] Covid vaccine 2 doses in March.  No known sick contacts.  No vomiting.  She reports some mild wheezing.  She has never required intubation for asthma before, or been this ill before. On EMS arrival, patient's O2 sats was 65%, she did not appear to be in any distress.  ED Course: temp 98.4, respiratory rate 16 - 24, heart rate 81 - 96, blood pressure systolic 98-3 08.  O2 sats 70% on 5 L, currently on high flow nasal cough is a 15 L.  Covid test negative.  WBC 13.8.  Potassium 2.7.  Magnesium normal 2.  Normal lactic acid 1.3.  Inflammatory markers checked, D-dimer markedly elevated at greater than 20.  Procalcitonin 2. Portable chest x-ray shows bilateral multifocal pneumonia.  Started on IV vancomycin, ceftriaxone and azithromycin.  Started on IV fluids.  Potassium repleted.  Magnesium given.  CTA chest ordered and pending.  Hospitalist to admit for further evaluation and management.   Past Medical History  Asthma SZ   Significant Hospital Events     Consults:  PCCM 8/25  Procedures:  Oral ET  8/25  >>>  Significant Diagnostic Tests:  CTa 8/25 >>> 1. Small nonocclusive pulmonary emboli in the lungs bilaterally, as above. The appearance of the lungs is highly concerning for severe multilobar bilateral pneumonia Echo 8/25 1. Left ventricular ejection fraction, by estimation, is 55 to 60%. The  left ventricle has normal function. The left ventricle has no regional  wall motion abnormalities. Left ventricular diastolic parameters are  indeterminate.  2. Right ventricular systolic function is moderately reduced. The right  ventricular size is severely enlarged. There is moderately elevated  pulmonary artery systolic pressure.  3. The mitral valve is normal in structure. Mild mitral valve  regurgitation. No evidence of mitral stenosis.  Venous dopplers 8/25 neg    Micro Data:  BC x 2  8/24 MRSA PCR  8/24  Covid 19 PCR  8/24  And 8/26  Neg x 3  PCT   8/24    2.08  Urine legionella   8/25 >>> Urine Strep  8/25  Neg  ET 8/25  Mod wbc, gpc/yeast >>>    Antimicrobials:  Rocephin 8/24 >>> Zmax 8/24 >>> Vanc 8/24     Scheduled Meds: . chlorhexidine gluconate (MEDLINE KIT)  15 mL Mouth Rinse BID  . Chlorhexidine Gluconate Cloth  6 each Topical Daily  . docusate  100 mg Oral BID  . etomidate  15 mg Intravenous Once  . folic acid  1 mg Oral Daily  .  ipratropium-albuterol  3 mL Nebulization Q4H  . mouth rinse  15 mL Mouth Rinse 10 times per day  . methylPREDNISolone (SOLU-MEDROL) injection  40 mg Intravenous Q8H  . multivitamin with minerals  1 tablet Oral Daily  . pantoprazole (PROTONIX) IV  40 mg Intravenous Q24H  . rocuronium  50 mg Intravenous Once  . thiamine  100 mg Oral Daily   Or  . thiamine  100 mg Intravenous Daily   Continuous Infusions: . azithromycin Stopped (10/28/19 1715)  . cefTRIAXone (ROCEPHIN)  IV Stopped (10/28/19 1540)  . dextrose 5 % and 0.45% NaCl 150 mL/hr at 10/29/19 1003  . fentaNYL infusion INTRAVENOUS 200 mcg/hr (10/29/19 0646)  . heparin  1,150 Units/hr (10/29/19 7616)  . propofol (DIPRIVAN) infusion 50 mcg/kg/min (10/29/19 0958)   PRN Meds:.acetaminophen **OR** acetaminophen, HYDROmorphone (DILAUDID) injection, LORazepam, ondansetron **OR** ondansetron (ZOFRAN) IV     Interim history/subjective:  Sedated but still breathing over back up    Objective   Blood pressure 125/71, pulse (!) 102, temperature 98.7 F (37.1 C), temperature source Oral, resp. rate (!) 33, height 5' 4"  (1.626 m), weight 54.4 kg, last menstrual period 10/26/2019, SpO2 98 %.    Vent Mode: PRVC FiO2 (%):  [40 %-70 %] 40 % Set Rate:  [15 bmp-24 bmp] 24 bmp Vt Set:  [430 mL] 430 mL PEEP:  [5 cmH20] 5 cmH20 Plateau Pressure:  [18 cmH20-26 cmH20] 18 cmH20   Intake/Output Summary (Last 24 hours) at 10/29/2019 1229 Last data filed at 10/29/2019 0737 Gross per 24 hour  Intake 3886.81 ml  Output 1775 ml  Net 2111.81 ml   Filed Weights   10/27/19 1342 10/29/19 0405  Weight: 54.4 kg 54.4 kg    Examination: Tmax 99.6 Pt sedated on diprovan/ fent drips/ still breathing well above backup rate with mild airtapping apparent on graphics No jvd Oropharynx cet Neck supple Lungs with insp>> exp rhonchi bilaterally RRR no s3 or or sign murmur Abd soft/ nl  excursion  Extr warm with no edema or clubbing noted      I personally reviewed images and agree with radiology impression as follows:  CXR:   Portable 8/26 1. No significant interval change in bilateral mid to lower lung field airspace opacities. 2. Stable support apparatus.   Resolved Hospital Problem list     Assessment & Plan:  1)  Acute CAP in pt with Asthma/ active wheezing - agrees with zmax/ rocephin   - see micro/abx flowsheets  - follow PCT  2) Acute vent dep resp failure secondary to cap/asthma >>> airtrapping when breath over 25 so set at 24 and contineu to sedate to prevent autopeep/ barotrauma   3) Multiple PE not typically assoc with ards pattern x in pts with collagen  vasc dz eg lupus / Antiphospholipid Syndrome (APS)  - check venous dopplers  Neg 8/25 - Echo suggests PE may not be  acute but definitely affecting RH function  - may need to change to argatraban if plt count real  4) Thromocytopenia  Lab Results  Component Value Date   PLT 39 (L) 10/29/2019   PLT 116 (L) 10/28/2019   PLT 142 (L) 10/28/2019    ? dic  ? HIT  ? Part of micoangiopathic process (doubt)  Rec:  Recheck cbc with DIC profile/ PCT ? Need hematology curbside?     Best practice:  Diet:npo Pain/Anxiety/Delirium protocol (if indicated): diprovan/ fentanyl  VAP protocol (if indicated):  DVT prophylaxis: hep drip GI prophylaxis: ppi Glucose control:  Mobility: bed rest Code Status: full code  Family Communication: per [facility]  Disposition: ICU  Labs   CBC: Recent Labs  Lab 10/27/19 1438 10/28/19 0331 10/28/19 0941 10/29/19 0408  WBC 13.8* 8.3 11.8* 18.0*  NEUTROABS 12.7*  --   --   --   HGB 10.3* 10.8* 10.6* 9.7*  HCT 31.3* 33.5* 32.6* 30.2*  MCV 83.5 85.0 84.2 84.1  PLT 217 142* 116* 39*    Basic Metabolic Panel: Recent Labs  Lab 10/27/19 1438 10/28/19 0331 10/28/19 0941 10/29/19 0408  NA 135 138 137 138  K 2.7* 4.0 4.1 3.9  CL 106 112* 112* 113*  CO2 16* 15* 15* 17*  GLUCOSE 96 125* 121* 150*  BUN 19 18 18  24*  CREATININE 0.89 0.66 0.65 0.71  CALCIUM 8.0* 7.7* 7.8* 7.7*  MG 2.0 2.6*  --   --   PHOS  --  2.4*  --   --    GFR: Estimated Creatinine Clearance: 85.1 mL/min (by C-G formula based on SCr of 0.71 mg/dL). Recent Labs  Lab 10/27/19 1438 10/28/19 0331 10/28/19 0941 10/29/19 0408  PROCALCITON 2.08  --   --   --   WBC 13.8* 8.3 11.8* 18.0*  LATICACIDVEN 1.3  --   --   --     Liver Function Tests: Recent Labs  Lab 10/27/19 1438 10/28/19 0331 10/28/19 0941 10/29/19 0408  AST 26 26 22  123*  ALT 12 16 15  59*  ALKPHOS 127* 162* 150* 130*  BILITOT 0.5 0.8 0.8 0.6  PROT 6.3* 6.2* 5.7* 5.3*  ALBUMIN 3.0* 2.9* 2.7* 2.6*   No  results for input(s): LIPASE, AMYLASE in the last 168 hours. No results for input(s): AMMONIA in the last 168 hours.  ABG    Component Value Date/Time   PHART 7.365 10/29/2019 0340   PCO2ART 32.6 10/29/2019 0340   PO2ART 125 (H) 10/29/2019 0340   HCO3 19.5 (L) 10/29/2019 0340   ACIDBASEDEF 6.2 (H) 10/29/2019 0340   O2SAT 97.7 10/29/2019 0340     Coagulation Profile: No results for input(s): INR, PROTIME in the last 168 hours.  Cardiac Enzymes: No results for input(s): CKTOTAL, CKMB, CKMBINDEX, TROPONINI in the last 168 hours.  HbA1C: No results found for: HGBA1C  CBG: Recent Labs  Lab 10/28/19 1633 10/28/19 1957 10/29/19 0006 10/29/19 0407 10/29/19 1135  GLUCAP 141* 167* 150* 146* 156*    The patient is critically ill with multiple organ systems failure and requires high complexity decision making for assessment and support, frequent evaluation and titration of therapies, application of advanced monitoring technologies and extensive interpretation of multiple databases. Critical Care Time devoted to patient care services described in this note is 40 minutes.    Christinia Gully, MD [facility] [phone]   After 7:00 pm call Elink  [phone]

## 2019-10-29 NOTE — Progress Notes (Signed)
eLink Physician-Brief Progress Note Patient Name: Diane Norton DOB: [birth year] MRN: [id number]   Date of Service  10/29/2019  HPI/Events of Note  Nursing concerned about RR = 49. Sat = 100% Currently on a Propofol IV infusion for sedation at 50 mcg/kg/hour.  eICU Interventions  Plan: 1. Please do 5 AM portable CXR now. 2. ABG STAT.     Intervention Category Major Interventions: Respiratory failure - evaluation and management  Alyson Ki Eugene 10/29/2019, 3:11 AM

## 2019-10-29 NOTE — Consult Note (Signed)
[facility] Consultation Oncology  Name: Diane Norton      MRN: [id number]    Location: IC02/IC02-01  Date: 10/29/2019 Time:5:54 PM   REFERRING PHYSICIAN: Dr. Marisa Severin  REASON FOR CONSULT: Thrombocytopenia   DIAGNOSIS: DIC versus TTP  HISTORY OF PRESENT ILLNESS: Diane Norton is a 34 year old white female seen in consultation today for further work-up and management of severe thrombocytopenia in the setting of IV heparin for small subsegmental pulmonary embolism.  She came in with difficulty breathing.  Platelet count on presentation on 10/27/2019 was 217.  On 10/28/2019 platelet count was 142, dropped to 116 at 9:40 AM.  Platelet count on 826 2021 drop to 39.  Patient does not have any bleeding.  She received Lovenox around the time of CT scan for pulmonary embolism.  PAST MEDICAL HISTORY:   Past Medical History:  Diagnosis Date  . Asthma   . Seizures (HCC)     ALLERGIES: Allergies  Allergen Reactions  . Bactrim [Sulfamethoxazole-Trimethoprim] Nausea And Vomiting  . Ciprofloxacin Nausea And Vomiting  . Darvocet [Propoxyphene N-Acetaminophen] Hives  . Heparin Other (See Comments)    HIT  . Other     darvocet  . Penicillins Itching  . Penicillins Hives  . Bactrim [Sulfamethoxazole-Trimethoprim] Rash  . Ciprofloxacin Rash      MEDICATIONS: I have reviewed the patient's current medications.     PAST SURGICAL HISTORY Past Surgical History:  Procedure Laterality Date  . CHOLECYSTECTOMY    . FEMUR IM NAIL Right 01/09/2015   Procedure: INTRAMEDULLARY (IM) RETROGRADE FEMORAL NAILING; RIGHT;  Surgeon: Nadara Mustard, MD;  Location: [facility] OR;  Service: Orthopedics;  Laterality: Right;  . ORIF PATELLA Right 01/09/2015   Procedure: OPEN REDUCTION INTERNAL (ORIF) FIXATION PATELLA; RIGHT;  Surgeon: Nadara Mustard, MD;  Location: [facility] OR;  Service: Orthopedics;  Laterality: Right;    FAMILY HISTORY: History reviewed. No pertinent family history.  SOCIAL HISTORY:  reports that she  has been smoking cigarettes. She has been smoking about 1.00 pack per day. She has never used smokeless tobacco. She reports that she does not drink alcohol and does not use drugs.  PERFORMANCE STATUS: The patient's performance status is 2 - Symptomatic, <50% confined to bed  PHYSICAL EXAM: Most Recent Vital Signs: Blood pressure 114/66, pulse 89, temperature 98.9 F (37.2 C), temperature source Axillary, resp. rate (!) 24, height 5\' 4"  (1.626 m), weight 119 lb 14.9 oz (54.4 kg), last menstrual period 10/26/2019, SpO2 100 %. BP 114/66   Pulse 89   Temp 98.9 F (37.2 C) (Axillary)   Resp (!) 24   Ht 5\' 4"  (1.626 m)   Wt 119 lb 14.9 oz (54.4 kg)   LMP 10/26/2019   SpO2 100%   BMI 20.59 kg/m  General appearance: appears stated age Lungs: Bilateral air entry with coarse breath sounds. Heart: regular rate and rhythm Abdomen: soft, non-tender; bowel sounds normal; no masses,  no organomegaly Extremities: No edema or ecchymosis. Skin: No ecchymosis or bleeding. Lymph nodes: Cervical, supraclavicular, and axillary nodes normal. Neurologic: Patient is sedated.  LABORATORY DATA:  Results for orders placed or performed during the hospital encounter of 10/27/19 (from the past 48 hour(s))  Blood gas, arterial     Status: Abnormal   Collection Time: 10/27/19  6:10 PM  Result Value Ref Range   FIO2 80.00    pH, Arterial 7.377 7.35 - 7.45   pCO2 arterial 27.7 (L) 32 - 48 mmHg   pO2, Arterial 71.0 (L) 83 -  108 mmHg   Bicarbonate 18.1 (L) 20.0 - 28.0 mmol/L   Acid-base deficit 8.2 (H) 0.0 - 2.0 mmol/L   O2 Saturation 92.0 %   Patient temperature 37.0    Allens test (pass/fail) PASS PASS    Comment: Performed at [facility], [address] [place], [place] [address]  Ethanol     Status: None   Collection Time: 10/27/19  8:01 PM  Result Value Ref Range   Alcohol, Ethyl (B) <10 <10 mg/dL    Comment: (NOTE) Lowest detectable limit for serum alcohol is 10 mg/dL.  For medical purposes  only. Performed at [facility], [address] [place], [place] [address]   HIV Antibody (routine testing w rflx)     Status: None   Collection Time: 10/27/19  8:01 PM  Result Value Ref Range   HIV Screen 4th Generation wRfx Non Reactive Non Reactive    Comment: Performed at [facility], [address] [place], [place] [address]  Legionella Pneumophila Serogp 1 Ur Ag     Status: None   Collection Time: 10/27/19  9:03 PM  Result Value Ref Range   L. pneumophila Serogp 1 Ur Ag Negative Negative    Comment: (NOTE) Presumptive negative for L. pneumophila serogroup 1 antigen in urine, suggesting no recent or current infection. Legionnaires' disease cannot be ruled out since other serogroups and species may also cause disease. Performed At: [facility] [address] [place], [place] [id number] Jolene SchimkeNagendra Sanjai MD IR:[id number]:[phone]    Source of Sample URINE, RANDOM     Comment: Performed at [facility], [address] [place], [place] [address]  Strep pneumoniae urinary antigen     Status: None   Collection Time: 10/27/19  9:03 PM  Result Value Ref Range   Strep Pneumo Urinary Antigen NEGATIVE NEGATIVE    Comment:        Infection due to S. pneumoniae cannot be absolutely ruled out since the antigen present may be below the detection limit of the test. Performed at [facility], [address] [place], [place] [address]   Na and K (sodium & potassium), rand urine     Status: None   Collection Time: 10/27/19  9:15 PM  Result Value Ref Range   Sodium, Ur 35 mmol/L   Potassium Urine 34 mmol/L    Comment: Performed at [facility], [address] [place], [place] [address]  Urine rapid drug screen (hosp performed)     Status: Abnormal   Collection Time: 10/27/19  9:15 PM  Result Value Ref Range   Opiates POSITIVE (A) NONE DETECTED   Cocaine NONE DETECTED NONE DETECTED   Benzodiazepines NONE DETECTED NONE DETECTED   Amphetamines NONE DETECTED NONE DETECTED    Tetrahydrocannabinol NONE DETECTED NONE DETECTED   Barbiturates NONE DETECTED NONE DETECTED    Comment: (NOTE) DRUG SCREEN FOR MEDICAL PURPOSES ONLY.  IF CONFIRMATION IS NEEDED FOR ANY PURPOSE, NOTIFY LAB WITHIN 5 DAYS.  LOWEST DETECTABLE LIMITS FOR URINE DRUG SCREEN Drug Class                     Cutoff (ng/mL) Amphetamine and metabolites    1000 Barbiturate and metabolites    200 Benzodiazepine                 200 Tricyclics and metabolites     300 Opiates and metabolites        300 Cocaine and metabolites  300 THC                            50 Performed at [facility], [address], [place], [place] [address]   Pregnancy, urine     Status: None   Collection Time: 10/27/19  9:16 PM  Result Value Ref Range   Preg Test, Ur NEGATIVE NEGATIVE    Comment:        THE SENSITIVITY OF THIS METHODOLOGY IS >20 mIU/mL. Performed at [facility], [address], [place], [place] [address]   MRSA PCR Screening     Status: None   Collection Time: 10/27/19  9:30 PM   Specimen: Nasopharyngeal  Result Value Ref Range   MRSA by PCR NEGATIVE NEGATIVE    Comment:        The GeneXpert MRSA Assay (FDA approved for NASAL specimens only), is one component of a comprehensive MRSA colonization surveillance program. It is not intended to diagnose MRSA infection nor to guide or monitor treatment for MRSA infections. Performed at [facility], [address] [place], [place] [address]   SARS Coronavirus 2 by RT PCR (hospital order, performed in [facility] hospital lab) Nasopharyngeal     Status: None   Collection Time: 10/27/19  9:32 PM   Specimen: Nasopharyngeal  Result Value Ref Range   SARS Coronavirus 2 NEGATIVE NEGATIVE    Comment: (NOTE) SARS-CoV-2 target nucleic acids are NOT DETECTED.  The SARS-CoV-2 RNA is generally detectable in upper and lower respiratory specimens during the acute phase of infection. The lowest concentration of SARS-CoV-2 viral copies this assay can  detect is 250 copies / mL. A negative result does not preclude SARS-CoV-2 infection and should not be used as the sole basis for treatment or other patient management decisions.  A negative result may occur with improper specimen collection / handling, submission of specimen other than nasopharyngeal swab, presence of viral mutation(s) within the areas targeted by this assay, and inadequate number of viral copies (<250 copies / mL). A negative result must be combined with clinical observations, patient history, and epidemiological information.  Fact Sheet for Patients:   BoilerBrush.com.cy  Fact Sheet for Healthcare Providers: https://pope.com/  This test is not yet approved or  cleared by the [place] FDA and has been authorized for detection and/or diagnosis of SARS-CoV-2 by FDA under an Emergency Use Authorization (EUA).  This EUA will remain in effect (meaning this test can be used) for the duration of the COVID-19 declaration under Section 564(b)(1) of the Act, 21 U.S.C. section 360bbb-3(b)(1), unless the authorization is terminated or revoked sooner.  Performed at [facility], [address] [place], [place] [address]   Blood gas, arterial (at [facility])     Status: Abnormal   Collection Time: 10/28/19  3:12 AM  Result Value Ref Range   FIO2 80.00    Delivery systems HI FLOW NASAL CANNULA    pH, Arterial 7.368 7.35 - 7.45   pCO2 arterial 25.5 (L) 32 - 48 mmHg   pO2, Arterial 79.1 (L) 83 - 108 mmHg   Bicarbonate 16.9 (L) 20.0 - 28.0 mmol/L   Acid-base deficit 10.0 (H) 0.0 - 2.0 mmol/L   O2 Saturation 93.9 %   Patient temperature 37.0    Collection site RIGHT RADIAL    Drawn by [id number]    Allens test (pass/fail) PASS PASS    Comment: Performed at [facility], [address], [place],  [place] [address]  CBC     Status: Abnormal   Collection Time: 10/28/19  3:31 AM  Result Value Ref Range   WBC 8.3 4.0 - 10.5 K/uL   RBC  3.94 3.87 - 5.11 MIL/uL   Hemoglobin 10.8 (L) 12.0 - 15.0 g/dL   HCT 03.7 (L) 36 - 46 %   MCV 85.0 80.0 - 100.0 fL   MCH 27.4 26.0 - 34.0 pg   MCHC 32.2 30.0 - 36.0 g/dL   RDW 04.8 (H) 88.9 - 16.9 %   Platelets 142 (L) 150 - 400 K/uL   nRBC 0.0 0.0 - 0.2 %    Comment: Performed at [facility], [address] [place], [place] [address]  Comprehensive metabolic panel     Status: Abnormal   Collection Time: 10/28/19  3:31 AM  Result Value Ref Range   Sodium 138 135 - 145 mmol/L   Potassium 4.0 3.5 - 5.1 mmol/L    Comment: DELTA CHECK NOTED   Chloride 112 (H) 98 - 111 mmol/L   CO2 15 (L) 22 - 32 mmol/L   Glucose, Bld 125 (H) 70 - 99 mg/dL    Comment: Glucose reference range applies only to samples taken after fasting for at least 8 hours.   BUN 18 6 - 20 mg/dL   Creatinine, Ser 8.82 0.44 - 1.00 mg/dL   Calcium 7.7 (L) 8.9 - 10.3 mg/dL   Total Protein 6.2 (L) 6.5 - 8.1 g/dL   Albumin 2.9 (L) 3.5 - 5.0 g/dL   AST 26 15 - 41 U/L   ALT 16 0 - 44 U/L   Alkaline Phosphatase 162 (H) 38 - 126 U/L   Total Bilirubin 0.8 0.3 - 1.2 mg/dL   GFR calc non Af Amer >60 >60 mL/min   GFR calc Af Amer >60 >60 mL/min   Anion gap 11 5 - 15    Comment: Performed at [facility], [address] [place], [place] [address]  Magnesium     Status: Abnormal   Collection Time: 10/28/19  3:31 AM  Result Value Ref Range   Magnesium 2.6 (H) 1.7 - 2.4 mg/dL    Comment: Performed at [facility], [address], [place], [place] [address]  Phosphorus     Status: Abnormal   Collection Time: 10/28/19  3:31 AM  Result Value Ref Range   Phosphorus 2.4 (L) 2.5 - 4.6 mg/dL    Comment: Performed at [facility], [address], [place], [place] [address]  Blood gas, arterial (at [facility])     Status: Abnormal   Collection Time: 10/28/19  8:55 AM  Result Value Ref Range   FIO2 80.00    pH, Arterial 7.418 7.35 - 7.45   pCO2 arterial 23.3 (L) 32 - 48 mmHg   pO2, Arterial 111 (H) 83 - 108 mmHg   Bicarbonate 17.9 (L)  20.0 - 28.0 mmol/L   Acid-base deficit 8.9 (H) 0.0 - 2.0 mmol/L   O2 Saturation 97.5 %   Patient temperature 37.0    Allens test (pass/fail) PASS PASS    Comment: Performed at [facility], [address], [place], [place] [address]  CBC     Status: Abnormal   Collection Time: 10/28/19  9:41 AM  Result Value Ref Range   WBC 11.8 (H) 4.0 - 10.5 K/uL   RBC 3.87 3.87 - 5.11 MIL/uL   Hemoglobin 10.6 (L) 12.0 - 15.0 g/dL   HCT 16.5 (L) 36 - 46 %  MCV 84.2 80.0 - 100.0 fL   MCH 27.4 26.0 - 34.0 pg   MCHC 32.5 30.0 - 36.0 g/dL   RDW 16.1 (H) 09.6 - 04.5 %   Platelets 116 (L) 150 - 400 K/uL    Comment: PLATELET COUNT CONFIRMED BY SMEAR SPECIMEN CHECKED FOR CLOTS Immature Platelet Fraction may be clinically indicated, consider ordering this additional test [id number]    nRBC 0.2 0.0 - 0.2 %    Comment: Performed at [facility], [address], [place], [place] [address]  Comprehensive metabolic panel     Status: Abnormal   Collection Time: 10/28/19  9:41 AM  Result Value Ref Range   Sodium 137 135 - 145 mmol/L   Potassium 4.1 3.5 - 5.1 mmol/L   Chloride 112 (H) 98 - 111 mmol/L   CO2 15 (L) 22 - 32 mmol/L   Glucose, Bld 121 (H) 70 - 99 mg/dL    Comment: Glucose reference range applies only to samples taken after fasting for at least 8 hours.   BUN 18 6 - 20 mg/dL   Creatinine, Ser 5.62 0.44 - 1.00 mg/dL   Calcium 7.8 (L) 8.9 - 10.3 mg/dL   Total Protein 5.7 (L) 6.5 - 8.1 g/dL   Albumin 2.7 (L) 3.5 - 5.0 g/dL   AST 22 15 - 41 U/L   ALT 15 0 - 44 U/L   Alkaline Phosphatase 150 (H) 38 - 126 U/L   Total Bilirubin 0.8 0.3 - 1.2 mg/dL   GFR calc non Af Amer >60 >60 mL/min   GFR calc Af Amer >60 >60 mL/min   Anion gap 10 5 - 15    Comment: Performed at [facility], [address], [place], [place] [address]  CBG monitoring, ED     Status: Abnormal   Collection Time: 10/28/19 12:33 PM  Result Value Ref Range   Glucose-Capillary 218 (H) 70 - 99 mg/dL    Comment: Glucose reference  range applies only to samples taken after fasting for at least 8 hours.  Culture, respiratory (non-expectorated)     Status: None (Preliminary result)   Collection Time: 10/28/19  1:56 PM   Specimen: Tracheal Aspirate; Respiratory  Result Value Ref Range   Specimen Description      TRACHEAL ASPIRATE Performed at [facility], [address] [place], [place] [address]    Special Requests      Normal Performed at [facility], [address], [place], [place] [address]    Gram Stain      MODERATE WBC PRESENT, PREDOMINANTLY PMN RARE SQUAMOUS EPITHELIAL CELLS PRESENT ABUNDANT YEAST FEW GRAM POSITIVE COCCI Performed at [facility], [address], [place], [place] [address]    Culture PENDING    Report Status PENDING   Blood gas, arterial     Status: Abnormal   Collection Time: 10/28/19  2:50 PM  Result Value Ref Range   FIO2 60.00    pH, Arterial 7.230 (L) 7.35 - 7.45   pCO2 arterial 42.0 32 - 48 mmHg   pO2, Arterial 185 (H) 83 - 108 mmHg   Bicarbonate 16.8 (L) 20.0 - 28.0 mmol/L   Acid-base deficit 9.2 (H) 0.0 - 2.0 mmol/L   O2 Saturation 98.0 %   Patient temperature 36.9    Allens test (pass/fail) PASS PASS    Comment: Performed at [facility], [address] [place], [place] [address]  Glucose, capillary     Status: Abnormal   Collection Time: 10/28/19  4:33  PM  Result Value Ref Range   Glucose-Capillary 141 (H) 70 - 99 mg/dL    Comment: Glucose reference range applies only to samples taken after fasting for at least 8 hours.  Glucose, capillary     Status: Abnormal   Collection Time: 10/28/19  7:57 PM  Result Value Ref Range   Glucose-Capillary 167 (H) 70 - 99 mg/dL    Comment: Glucose reference range applies only to samples taken after fasting for at least 8 hours.   Comment 1 Notify RN    Comment 2 Document in Chart   Glucose, capillary     Status: Abnormal   Collection Time: 10/29/19 12:06 AM  Result Value Ref Range   Glucose-Capillary 150 (H) 70 - 99 mg/dL     Comment: Glucose reference range applies only to samples taken after fasting for at least 8 hours.  Blood gas, arterial     Status: Abnormal   Collection Time: 10/29/19  3:40 AM  Result Value Ref Range   FIO2 50.00    pH, Arterial 7.365 7.35 - 7.45   pCO2 arterial 32.6 32 - 48 mmHg   pO2, Arterial 125 (H) 83 - 108 mmHg   Bicarbonate 19.5 (L) 20.0 - 28.0 mmol/L   Acid-base deficit 6.2 (H) 0.0 - 2.0 mmol/L   O2 Saturation 97.7 %   Patient temperature 37.0    Allens test (pass/fail) PASS PASS    Comment: Performed at [facility], [address], [place], [place] [address]  Glucose, capillary     Status: Abnormal   Collection Time: 10/29/19  4:07 AM  Result Value Ref Range   Glucose-Capillary 146 (H) 70 - 99 mg/dL    Comment: Glucose reference range applies only to samples taken after fasting for at least 8 hours.   Comment 1 Notify RN    Comment 2 Document in Chart   Heparin level (unfractionated)     Status: Abnormal   Collection Time: 10/29/19  4:08 AM  Result Value Ref Range   Heparin Unfractionated <0.10 (L) 0.30 - 0.70 IU/mL    Comment: (NOTE) If heparin results are below expected values, and patient dosage has  been confirmed, suggest follow up testing of antithrombin III levels. Performed at [facility], [address] [place], [place] [address]   CBC     Status: Abnormal   Collection Time: 10/29/19  4:08 AM  Result Value Ref Range   WBC 18.0 (H) 4.0 - 10.5 K/uL   RBC 3.59 (L) 3.87 - 5.11 MIL/uL   Hemoglobin 9.7 (L) 12.0 - 15.0 g/dL   HCT 24.2 (L) 36 - 46 %   MCV 84.1 80.0 - 100.0 fL   MCH 27.0 26.0 - 34.0 pg   MCHC 32.1 30.0 - 36.0 g/dL   RDW 35.3 (H) 61.4 - 43.1 %   Platelets 39 (L) 150 - 400 K/uL    Comment: PLATELET COUNT CONFIRMED BY SMEAR SPECIMEN CHECKED FOR CLOTS Immature Platelet Fraction may be clinically indicated, consider ordering this additional test [id number]    nRBC 0.2 0.0 - 0.2 %    Comment: Performed at [facility], [address],  [place], [place] [address]  Triglycerides     Status: Abnormal   Collection Time: 10/29/19  4:08 AM  Result Value Ref Range   Triglycerides 213 (H) <150 mg/dL    Comment: Performed at [facility], [address], [place], [place] [address]  Comprehensive metabolic panel     Status: Abnormal   Collection  Time: 10/29/19  4:08 AM  Result Value Ref Range   Sodium 138 135 - 145 mmol/L   Potassium 3.9 3.5 - 5.1 mmol/L   Chloride 113 (H) 98 - 111 mmol/L   CO2 17 (L) 22 - 32 mmol/L   Glucose, Bld 150 (H) 70 - 99 mg/dL    Comment: Glucose reference range applies only to samples taken after fasting for at least 8 hours.   BUN 24 (H) 6 - 20 mg/dL   Creatinine, Ser 4.09 0.44 - 1.00 mg/dL   Calcium 7.7 (L) 8.9 - 10.3 mg/dL   Total Protein 5.3 (L) 6.5 - 8.1 g/dL   Albumin 2.6 (L) 3.5 - 5.0 g/dL   AST 811 (H) 15 - 41 U/L   ALT 59 (H) 0 - 44 U/L   Alkaline Phosphatase 130 (H) 38 - 126 U/L   Total Bilirubin 0.6 0.3 - 1.2 mg/dL   GFR calc non Af Amer >60 >60 mL/min   GFR calc Af Amer >60 >60 mL/min   Anion gap 8 5 - 15    Comment: Performed at [facility], [address], [place], [place] [address]  ABO/Rh     Status: None   Collection Time: 10/29/19  4:08 AM  Result Value Ref Range   ABO/RH(D)      O POS Performed at [facility], [address], [place], [place] [address]   SARS Coronavirus 2 by RT PCR (hospital order, performed in [facility] hospital lab) Nasopharyngeal Nasopharyngeal Swab     Status: None   Collection Time: 10/29/19  9:29 AM   Specimen: Nasopharyngeal Swab  Result Value Ref Range   SARS Coronavirus 2 NEGATIVE NEGATIVE    Comment: (NOTE) SARS-CoV-2 target nucleic acids are NOT DETECTED.  The SARS-CoV-2 RNA is generally detectable in upper and lower respiratory specimens during the acute phase of infection. The lowest concentration of SARS-CoV-2 viral copies this assay can detect is 250 copies / mL. A negative result does not preclude SARS-CoV-2 infection and should not be  used as the sole basis for treatment or other patient management decisions.  A negative result may occur with improper specimen collection / handling, submission of specimen other than nasopharyngeal swab, presence of viral mutation(s) within the areas targeted by this assay, and inadequate number of viral copies (<250 copies / mL). A negative result must be combined with clinical observations, patient history, and epidemiological information.  Fact Sheet for Patients:   BoilerBrush.com.cy  Fact Sheet for Healthcare Providers: https://pope.com/  This test is not yet approved or  cleared by the [place] FDA and has been authorized for detection and/or diagnosis of SARS-CoV-2 by FDA under an Emergency Use Authorization (EUA).  This EUA will remain in effect (meaning this test can be used) for the duration of the COVID-19 declaration under Section 564(b)(1) of the Act, 21 U.S.C. section 360bbb-3(b)(1), unless the authorization is terminated or revoked sooner.  Performed at [facility], [address], [place], [place] [address]   Glucose, capillary     Status: Abnormal   Collection Time: 10/29/19 11:35 AM  Result Value Ref Range   Glucose-Capillary 156 (H) 70 - 99 mg/dL    Comment: Glucose reference range applies only to samples taken after fasting for at least 8 hours.  Heparin level (unfractionated)     Status: Abnormal   Collection Time: 10/29/19 11:57 AM  Result Value Ref Range   Heparin Unfractionated 0.11 (L) 0.30 - 0.70 IU/mL    Comment: (  NOTE) If heparin results are below expected values, and patient dosage has  been confirmed, suggest follow up testing of antithrombin III levels. Performed at [facility], [address], [place], [place] [address]   CBC     Status: Abnormal   Collection Time: 10/29/19 11:57 AM  Result Value Ref Range   WBC 16.4 (H) 4.0 - 10.5 K/uL   RBC 3.24 (L) 3.87 - 5.11 MIL/uL   Hemoglobin 8.8  (L) 12.0 - 15.0 g/dL   HCT 60.4 (L) 36 - 46 %   MCV 84.6 80.0 - 100.0 fL   MCH 27.2 26.0 - 34.0 pg   MCHC 32.1 30.0 - 36.0 g/dL   RDW 54.0 (H) 98.1 - 19.1 %   Platelets 37 (L) 150 - 400 K/uL    Comment: PLATELET COUNT CONFIRMED BY SMEAR SPECIMEN CHECKED FOR CLOTS Immature Platelet Fraction may be clinically indicated, consider ordering this additional test [id number]    nRBC 0.0 0.0 - 0.2 %    Comment: Performed at [facility], [address] [place], [place] [address]  DIC Panel (Not at [facility]) ONCE - STAT     Status: Abnormal   Collection Time: 10/29/19  1:37 PM  Result Value Ref Range   Prothrombin Time 17.6 (H) 11.4 - 15.2 seconds   INR 1.5 (H) 0.8 - 1.2    Comment: (NOTE) INR goal varies based on device and disease states.    aPTT 52 (H) 24 - 36 seconds    Comment:        IF BASELINE aPTT IS ELEVATED, SUGGEST PATIENT RISK ASSESSMENT BE USED TO DETERMINE APPROPRIATE ANTICOAGULANT THERAPY.    Fibrinogen 79 (LL) 210 - 475 mg/dL    Comment: CRITICAL RESULT CALLED TO, READ BACK BY AND VERIFIED WITH: MURPHY,[email or web address]  BY MATTHEWS, B 8.26.2021    D-Dimer, Quant >20.00 (H) 0.00 - 0.50 ug/mL-FEU    Comment: (NOTE) At the manufacturer cut-off of 0.50 ug/mL FEU, this assay has been documented to exclude PE with a sensitivity and negative predictive value of 97 to 99%.  At this time, this assay has not been approved by the FDA to exclude DVT/VTE. Results should be correlated with clinical presentation.    Platelets 36 (L) 150 - 400 K/uL    Comment: PLATELET COUNT CONFIRMED BY SMEAR SPECIMEN CHECKED FOR CLOTS Immature Platelet Fraction may be clinically indicated, consider ordering this additional test [id number]    Smear Review Schistocytes present     Comment: PENDING PATHOLOGIST REVIEW Performed at [facility], [address] [place], [place] [address]   Procalcitonin - Baseline     Status: None   Collection Time: 10/29/19  1:37 PM  Result Value Ref Range   Procalcitonin  0.81 ng/mL    Comment:        Interpretation: PCT > 0.5 ng/mL and <= 2 ng/mL: Systemic infection (sepsis) is possible, but other conditions are known to elevate PCT as well. (NOTE)       Sepsis PCT Algorithm           Lower Respiratory Tract                                      Infection PCT Algorithm    ----------------------------     ----------------------------         PCT < 0.25 ng/mL  PCT < 0.10 ng/mL          Strongly encourage             Strongly discourage   discontinuation of antibiotics    initiation of antibiotics    ----------------------------     -----------------------------       PCT 0.25 - 0.50 ng/mL            PCT 0.10 - 0.25 ng/mL               OR       >80% decrease in PCT            Discourage initiation of                                            antibiotics      Encourage discontinuation           of antibiotics    ----------------------------     -----------------------------         PCT >= 0.50 ng/mL              PCT 0.26 - 0.50 ng/mL                AND       <80% decrease in PCT             Encourage initiation of                                             antibiotics       Encourage continuation           of antibiotics    ----------------------------     -----------------------------        PCT >= 0.50 ng/mL                  PCT > 0.50 ng/mL               AND         increase in PCT                  Strongly encourage                                      initiation of antibiotics    Strongly encourage escalation           of antibiotics                                     -----------------------------                                           PCT <= 0.25 ng/mL                                                 OR                                        >  80% decrease in PCT                                      Discontinue / Do not initiate                                             antibiotics  Performed at [facility],  [address] [place], [place] [address]   Prepare fresh frozen plasma     Status: None (Preliminary result)   Collection Time: 10/29/19  3:38 PM  Result Value Ref Range   Unit Number [id number]    Blood Component Type THW PLS APHR    Unit division 00    Status of Unit ISSUED    Transfusion Status      OK TO TRANSFUSE Performed at [facility], [address], [place], [place] [address]    Unit Number [id number]    Blood Component Type THAWED PLASMA    Unit division 00    Status of Unit ALLOCATED    Transfusion Status OK TO TRANSFUSE   Prepare cryoprecipitate     Status: None (Preliminary result)   Collection Time: 10/29/19  3:39 PM  Result Value Ref Range   Unit Number [id number]    Blood Component Type CRYPOOL THAW    Unit division 00    Status of Unit ALLOCATED    Transfusion Status OK TO TRANSFUSE   Type and screen [facility]     Status: None   Collection Time: 10/29/19  4:10 PM  Result Value Ref Range   ABO/RH(D) O POS    Antibody Screen NEG    Sample Expiration      11/01/2019,2359 Performed at [facility], [address], [place], [place] [address]   Glucose, capillary     Status: Abnormal   Collection Time: 10/29/19  5:04 PM  Result Value Ref Range   Glucose-Capillary 120 (H) 70 - 99 mg/dL    Comment: Glucose reference range applies only to samples taken after fasting for at least 8 hours.      RADIOGRAPHY: CT Angio Chest PE W and/or Wo Contrast  Result Date: 10/28/2019 CLINICAL DATA:  34 year old female with history of shortness of breath for the past 4 days. Fever. EXAM: CT ANGIOGRAPHY CHEST WITH CONTRAST TECHNIQUE: Multidetector CT imaging of the chest was performed using the standard protocol during bolus administration of intravenous contrast. Multiplanar CT image reconstructions and MIPs were obtained to evaluate the vascular anatomy. CONTRAST:  75mL OMNIPAQUE IOHEXOL 350 MG/ML SOLN COMPARISON:  Chest CT 01/09/2015. FINDINGS: Cardiovascular: Tiny  nonobstructive filling defects are noted within subsegmental pulmonary artery branches to the right lower lobe (axial image 194 of series 5) and the left upper lobe (axial image 155 of series 5). No other larger central, lobar or segmental sized filling defects are noted. Heart size is normal. There is no significant pericardial fluid, thickening or pericardial calcification. No atherosclerotic calcifications in the thoracic aorta or coronary arteries. Mediastinum/Nodes: No pathologically enlarged mediastinal or hilar lymph nodes. Esophagus is unremarkable in appearance. No axillary lymphadenopathy. Lungs/Pleura: Widespread areas of severe ground-glass attenuation are noted scattered throughout the lungs bilaterally. No pleural effusions. Mild diffuse bronchial wall thickening with moderate centrilobular and paraseptal emphysema. Upper Abdomen: Unremarkable. Musculoskeletal: There are no aggressive appearing lytic  or blastic lesions noted in the visualized portions of the skeleton. Review of the MIP images confirms the above findings. IMPRESSION: 1. Small nonocclusive pulmonary emboli in the lungs bilaterally, as above. 2. The appearance of the lungs is highly concerning for severe multilobar bilateral pneumonia, likely from atypical viral etiologies such as COVID infection. 3. Diffuse bronchial wall thickening with moderate centrilobular and paraseptal emphysema; imaging findings suggestive of underlying COPD. These results will be called to the ordering clinician or representative by the Radiologist Assistant, and communication documented in the PACS or [employer]. Emphysema (ICD10-J43.9). Electronically Signed   By: Trudie Reed M.D.   On: 10/28/2019 09:37   US Abdomen Limited  Result Date: 10/29/2019 CLINICAL DATA:  Elevated LFTs EXAM: ULTRASOUND ABDOMEN LIMITED RIGHT UPPER QUADRANT COMPARISON:  January 11, 2019. FINDINGS: Gallbladder: Status post cholecystectomy. Common bile duct: Diameter: 2 mm,  normal Liver: No focal lesion identified. Mildly heterogeneous liver echotexture. Portal vein is patent on color Doppler imaging with normal direction of blood flow towards the liver. Other: None. IMPRESSION: Mildly heterogeneous liver echotexture without focal lesion identified. Electronically Signed   By: Meda Klinefelter MD   On: 10/29/2019 09:36   US Venous Img Lower Bilateral (DVT)  Result Date: 10/28/2019 CLINICAL DATA:  Acute respiratory failure. Small volume bilateral pulmonary embolism. Evaluate for DVT. EXAM: BILATERAL LOWER EXTREMITY VENOUS DOPPLER ULTRASOUND TECHNIQUE: Gray-scale sonography with graded compression, as well as color Doppler and duplex ultrasound were performed to evaluate the lower extremity deep venous systems from the level of the common femoral vein and including the common femoral, femoral, profunda femoral, popliteal and calf veins including the posterior tibial, peroneal and gastrocnemius veins when visible. The superficial great saphenous vein was also interrogated. Spectral Doppler was utilized to evaluate flow at rest and with distal augmentation maneuvers in the common femoral, femoral and popliteal veins. COMPARISON:  None. FINDINGS: RIGHT LOWER EXTREMITY Common Femoral Vein: No evidence of thrombus. Normal compressibility, respiratory phasicity and response to augmentation. Saphenofemoral Junction: No evidence of thrombus. Normal compressibility and flow on color Doppler imaging. Profunda Femoral Vein: No evidence of thrombus. Normal compressibility and flow on color Doppler imaging. Femoral Vein: No evidence of thrombus. Normal compressibility, respiratory phasicity and response to augmentation. Popliteal Vein: No evidence of thrombus. Normal compressibility, respiratory phasicity and response to augmentation. Calf Veins: No evidence of thrombus. Normal compressibility and flow on color Doppler imaging. Superficial Great Saphenous Vein: No evidence of thrombus. Normal  compressibility. Venous Reflux:  None. Other Findings:  None. LEFT LOWER EXTREMITY Common Femoral Vein: No evidence of thrombus. Normal compressibility, respiratory phasicity and response to augmentation. Saphenofemoral Junction: No evidence of thrombus. Normal compressibility and flow on color Doppler imaging. Profunda Femoral Vein: No evidence of thrombus. Normal compressibility and flow on color Doppler imaging. Femoral Vein: No evidence of thrombus. Normal compressibility, respiratory phasicity and response to augmentation. Popliteal Vein: No evidence of thrombus. Normal compressibility, respiratory phasicity and response to augmentation. Calf Veins: No evidence of thrombus. Normal compressibility and flow on color Doppler imaging. Superficial Great Saphenous Vein: No evidence of thrombus. Normal compressibility. Venous Reflux:  None. Other Findings:  None. IMPRESSION: No evidence of DVT within either lower extremity. Electronically Signed   By: Simonne Come M.D.   On: 10/28/2019 16:50   DG CHEST PORT 1 VIEW  Result Date: 10/29/2019 CLINICAL DATA:  34 year old female with respiratory failure hypoxia. EXAM: PORTABLE CHEST 1 VIEW COMPARISON:  Chest radiograph dated 10/28/2019 and CT dated 10/28/2019. FINDINGS: Endotracheal tube above  the carina and enteric tube extends below the diaphragm with tip the on the inferior margin of the image and side-port in the region of the stomach. Bilateral mid to lower lung field airspace opacities similar to prior radiograph. No large pleural effusion. No pneumothorax. Mild cardiomegaly. No acute osseous pathology. IMPRESSION: 1. No significant interval change in bilateral mid to lower lung field airspace opacities. 2. Stable support apparatus. Electronically Signed   By: Elgie Collard M.D.   On: 10/29/2019 03:46   DG Chest Portable 1 View  Result Date: 10/28/2019 CLINICAL DATA:  Status post intubation today. EXAM: PORTABLE CHEST 1 VIEW COMPARISON:  Single-view of the  chest 10/27/2019. FINDINGS: New NG tube is in place in good position with the side-port in the stomach. Endotracheal tube is also in good position with the tip just below the clavicular heads. Right worse than left airspace disease has progressed since yesterday's exam. No pneumothorax or pleural fluid. Heart size normal. IMPRESSION: ETT and NG tube in good position. Worsened airspace disease consistent with progressive pneumonia. Electronically Signed   By: Drusilla Kanner M.D.   On: 10/28/2019 11:42   ECHOCARDIOGRAM COMPLETE  Result Date: 10/28/2019    ECHOCARDIOGRAM REPORT   Patient Name:   Diane Norton Date of Exam: 10/28/2019 Medical Rec #:  [id number]       Height:       64.0 in Accession #:    [id number]      Weight:       120.0 lb Date of Birth:  11-14-1985        BSA:          1.575 m Patient Age:    34 years        BP:           130/94 mmHg Patient Gender: F               HR:           116 bpm. [facility] Procedure: 2D Echo, Cardiac Doppler and Color Doppler Indications:    Pulmonary Embolus 415.19 / I26.99  History:        Patient has no prior history of Echocardiogram examinations.                 Multifocal pneumonia, Acute respiratory failure with hypoxia.  Sonographer:    Celesta Gentile RCS Referring Phys: [phone] [employer]MOK[employer] IMPRESSIONS  1. Left ventricular ejection fraction, by estimation, is 55 to 60%. The left ventricle has normal function. The left ventricle has no regional wall motion abnormalities. Left ventricular diastolic parameters are indeterminate.  2. Right ventricular systolic function is moderately reduced. The right ventricular size is severely enlarged. There is moderately elevated pulmonary artery systolic pressure.  3. The mitral valve is normal in structure. Mild mitral valve regurgitation. No evidence of mitral stenosis.  4. The aortic valve has an indeterminant number of cusps. Aortic valve regurgitation is not visualized. No aortic stenosis is present.  FINDINGS  Left Ventricle: Left ventricular ejection fraction, by estimation, is 55 to 60%. The left ventricle has normal function. The left ventricle has no regional wall motion abnormalities. The left ventricular internal cavity size was normal in size. There is  no left ventricular hypertrophy. Left ventricular diastolic parameters are indeterminate. Right Ventricle: There is septal flattening in systole. The right ventricular size is severely enlarged. No increase in right ventricular wall thickness. Right ventricular systolic function is moderately reduced. There is moderately elevated  pulmonary artery systolic pressure. The tricuspid regurgitant velocity is 3.23 m/s, and with an assumed right atrial pressure of 10 mmHg, the estimated right ventricular systolic pressure is 51.7 mmHg. Left Atrium: Left atrial size was normal in size. Right Atrium: Right atrial size was normal in size. Pericardium: There is no evidence of pericardial effusion. Mitral Valve: The mitral valve is normal in structure. Mild mitral valve regurgitation. No evidence of mitral valve stenosis. Tricuspid Valve: The tricuspid valve is normal in structure. Tricuspid valve regurgitation is mild . No evidence of tricuspid stenosis. Aortic Valve: The aortic valve has an indeterminant number of cusps. Aortic valve regurgitation is not visualized. No aortic stenosis is present. Aortic valve mean gradient measures 2.7 mmHg. Aortic valve peak gradient measures 4.9 mmHg. Aortic valve area, by VTI measures 1.50 cm. Pulmonic Valve: The pulmonic valve was not well visualized. Pulmonic valve regurgitation is not visualized. No evidence of pulmonic stenosis. Aorta: The aortic root is normal in size and structure. Venous: IVC assessment for right atrial pressure unable to be performed due to mechanical ventilation. IAS/Shunts: No atrial level shunt detected by color flow Doppler.  LEFT VENTRICLE PLAX 2D LVIDd:         3.94 cm  Diastology LVIDs:          2.98 cm  LV e' lateral:   19.90 cm/s LV PW:         0.80 cm  LV E/e' lateral: 4.6 LV IVS:        0.83 cm  LV e' medial:    14.40 cm/s LVOT diam:     1.70 cm  LV E/e' medial:  6.3 LV SV:         27 LV SV Index:   17 LVOT Area:     2.27 cm  RIGHT VENTRICLE RV S prime:     15.70 cm/s TAPSE (M-mode): 2.2 cm LEFT ATRIUM           Index       RIGHT ATRIUM           Index LA diam:      2.20 cm 1.40 cm/m  RA Area:     17.40 cm LA Vol (A4C): 34.9 ml 22.16 ml/m RA Volume:   48.50 ml  30.80 ml/m  AORTIC VALVE AV Area (Vmax):    1.78 cm AV Area (Vmean):   1.50 cm AV Area (VTI):     1.50 cm AV Vmax:           110.51 cm/s AV Vmean:          78.791 cm/s AV VTI:            0.178 m AV Peak Grad:      4.9 mmHg AV Mean Grad:      2.7 mmHg LVOT Vmax:         86.50 cm/s LVOT Vmean:        52.200 cm/s LVOT VTI:          0.117 m LVOT/AV VTI ratio: 0.66  AORTA Ao Root diam: 3.10 cm MITRAL VALVE               TRICUSPID VALVE MV Area (PHT): 6.96 cm    TR Peak grad:   41.7 mmHg MV Decel Time: 109 msec    TR Vmax:        323.00 cm/s MV E velocity: 90.90 cm/s  SHUNTS                            Systemic VTI:  0.12 m                            Systemic Diam: 1.70 cm Dina Rich MD Electronically signed by Dina Rich MD Signature Date/Time: 10/28/2019/3:31:55 PM    Final        ASSESSMENT and PLAN:  1.  Severe thrombocytopenia: -CT PE protocol on 10/28/2019 showed tiny nonobstructive filling defects noted within the subsegmental pulmonary artery branches to the right lower lobe and left upper lobe.  No other larger central, lobar or segmental sized filling defects noted. -Fibrinogen on admission at 594 on 10/27/2019.  D-dimer was elevated on more than 20. -Fibrinogen today dropped to 79. -INR is 1.5 with a PT of 17.6.  PTT was 52.  Heparin level was undetectable. -Platelet count today dropped to 36,000. -Because of evidence of DIC, and possibility of bleeding in the setting of DIC, was  recommended to stop heparin.  The clot burden on the CT scan was very low. -I have reviewed peripheral blood smear which showed schistocytes 4 per high-power field.  No abnormal white cells were seen.  Thrombocytopenia was confirmed.  Occasional giant platelets. -I will check LDH and ADAMTS 13 activity to evaluate for TTP even though low suspicion.  She did not have any mental status changes or neurological symptoms. -Heparin-induced thrombocytopenia less likely as the platelet count started coming down even prior to patient receiving heparin. -I have also recommended switching antibiotics as there is remote likelihood of drug-induced thrombocytopenia.  2.  Severe multilobar bilateral pneumonia: -Patient presented with tachypnea. -CT scan showed widespread areas of severe groundglass opacification throughout the lungs bilaterally. -She was tested negative for COVID-19 x3. -She was intubated yesterday and is currently on 40% FiO2.  3.  DIC: -Would recommend cryoprecipitate and plasma. -Consider checking DIC labs at least twice daily.  4.  Elevated LFTs: -Her AST and ALT have been trending up today.  Closely monitor.  All questions were answered. The patient knows to call the clinic with any problems, questions or concerns. We can certainly see the patient much sooner if necessary.    Doreatha Massed

## 2019-10-29 NOTE — Progress Notes (Signed)
Pharmacy Antibiotic Note  Diane Norton is a 34 y.o. female admitted on 10/27/2019 with pneumonia.  Pharmacy has been consulted for meropenem dosing.  Patient has multiple allergies to different antibiotic classes.  Plan: Start meropenem 1g IV q8h Pharmacy to monitor renal function, cultures and patient progress.  Height: 5\' 4"  (162.6 cm) Weight: 54.4 kg (119 lb 14.9 oz) IBW/kg (Calculated) : 54.7  Temp (24hrs), Avg:98.6 F (37 C), Min:97.8 F (36.6 C), Max:99.6 F (37.6 C)  Recent Labs  Lab 10/27/19 1438 10/28/19 0331 10/28/19 0941 10/29/19 0408 10/29/19 1157  WBC 13.8* 8.3 11.8* 18.0* 16.4*  CREATININE 0.89 0.66 0.65 0.71  --   LATICACIDVEN 1.3  --   --   --   --     Estimated Creatinine Clearance: 85.1 mL/min (by C-G formula based on SCr of 0.71 mg/dL).    Allergies  Allergen Reactions  . Bactrim [Sulfamethoxazole-Trimethoprim] Nausea And Vomiting  . Ciprofloxacin Nausea And Vomiting  . Darvocet [Propoxyphene N-Acetaminophen] Hives  . Heparin Other (See Comments)    HIT  . Other     darvocet  . Penicillins Itching  . Penicillins Hives  . Bactrim [Sulfamethoxazole-Trimethoprim] Rash  . Ciprofloxacin Rash    Antimicrobials this admission: doxycycline 8/26 >>   meropenem 8/26>>  azithromycin 8/24 >>8/26 ceftriaxone 8/24 >>8/26   Microbiology results:  8/24 BC x2:  NG X2 days 8/24 MRSA PCR: neg 8/25 Resp Cx: few GPC on GS 8/25 Strep Pneum UA: neg 8/25: legionella: pending   Thank you for allowing pharmacy to be a part of this patient's care.  9/25 10/29/2019 3:57 PM

## 2019-10-29 NOTE — Progress Notes (Addendum)
Patient Demographics:    Diane Norton, is a 34 y.o. female, DOB - 1985-11-10, Diane Norton  Admit date - 10/27/2019   Admitting Physician Diane Dufresne Denton Brick, MD  Outpatient Primary MD for the patient is Patient, No Pcp Per  LOS - 2   Chief Complaint  Patient presents with  . Shortness of Breath        Subjective:    Diane Norton patient was restless overnight, healing added IV fentanyl drip to patient's propofol --Platelets continues to drift down, no obvious bleeding noted -  Assessment  & Plan :    Principal Problem:   Acute respiratory failure with hypoxia (HCC) Active Problems:   Asthma   Multifocal pneumonia   History of seizures   PNA (pneumonia)  Brief summary 34 year old Caucasian female with past medical history relevant for ongoing heroin abuse, 2 pack-a-day smoker with COPD/emphysema, history of underlying asthma, history of seizures with no recent seizure episodes and 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 Reather Converse, decision made to intubate on 10/28/19 -completed COVID-19 vaccination [employer]) in  April 2021 -COVID-19 test negative x 3 times  this admission  2) bilateral multifocal pneumonia---- intubated for hypoxic respiratory failure as above #1 --Bronchodilators, Rocephin/azithromycin as ordered -Strep pneumo  negative, -Legionella pending  3) acute COPD/emphysema exacerbation--- secondary to #2 above, contributing to #1 above --Patient is a 2 pack-a-day smoker -IV Solu-Medrol and bronchodilators as ordered  4) heroin addiction--- patient uses heroin on admission at least $50 worth of heroin on a daily basis, she does not inject apparently she snorts heroin -As needed lorazepam and continuous propofol drip as ordered -Currently on IV fentanyl drip  5)Bilateral nonocclusive pulmonary embolism--- patient received therapeutic dose of Lovenox on 10/27/2019 --Started IV heparin drip on 10/28/2019, switching to argatroban on 10/29/2019 due to worsening thrombocytopenia -Echo with EF of 55 to 60%, with severe right ventricular enlargement and moderate pulmonary hypertension -Lower extremity venous Dopplers negative  6)Social/Ethics---Spoke with Brother --- Diane Norton at [phone] -- Brother unable to visit as he was dxed with covid PNA on 10/10/19 and he was just dced from hospital last week  Pt's brother stated that [name] was not around him recently (not exposed to him while he had covid) --  Spoke with Mother---Ms Diane Norton [phone] Updated Both brother and mother----  76) history of seizures--no recent seizures, currently intubated and sedated with propofol along with as needed lorazepam  8) acute on chronic anemia and acute thrombocytopenia--- on admission on 10/27/2019 platelet count was 217, patient received Lovenox 80 mg subcu x1 on 10/27/2019 -Patient was started on IV heparin for PE on 10/28/2019 in a.m. Platelets 217>>142>>116>>39>>37 Hgb 10.8 >>> 8.8 --Stop IV heparin, hematology consult from Dr. Delton Coombes requested -Check HIT panel -Pending okay from Dr. Delton Coombes will start IV argatroban -No obvious bleeding at this time  CRITICAL CARE Performed by: Roxan Hockey   Total critical care time: 43 minutes  Critical care time was exclusive of separately billable procedures and  treating other patients.  -acute hypoxic respiratory failure requiring intubation, sedation, IV antibiotics and IV argatroban drip  Vent settings:- PRVC/40%/5/24/430 --Still breathing above vent at times  Critical care was necessary to treat or prevent imminent or life-threatening deterioration.  Critical care was time spent personally by me on the following activities: development of treatment plan with patient and/or surrogate as well as nursing, discussions with consultants, evaluation of patient's response to treatment, examination of patient, obtaining history from patient or surrogate, ordering and performing treatments and interventions, ordering and review of laboratory studies, ordering and review of radiographic studies, pulse oximetry and re-evaluation of patient's condition.   Disposition/Need for in-Hospital Stay- patient unable to be discharged at this time due to --- -acute hypoxic respiratory failure requiring intubation, sedation, IV antibiotics and IV argatroban drip  Status is: Inpatient  Remains inpatient appropriate because:-acute hypoxic respiratory failure requiring intubation, sedation, IV antibiotics and IV argatroban drip   Disposition: The patient is from: Home              Anticipated d/c is to: TBD              Anticipated d/c date is: > 3 days              Patient currently is not medically stable to d/c. Barriers: Not Clinically Stable-  -acute hypoxic respiratory failure requiring intubation, sedation, IV antibiotics and IV argatroban drip  Code Status : Full  Family Communication:   -  D/w patient's mother Ms Diane Norton  -and pt Brother Diane Norton at [phone]  Consults  :  PCCM/hematology  DVT Prophylaxis  : IV argatroban  Lab Results  Component Value Date   PLT 36 (L) 10/29/2019    Inpatient Medications  Scheduled Meds: . chlorhexidine gluconate (MEDLINE KIT)  15 mL Mouth Rinse BID  . Chlorhexidine Gluconate Cloth  6 each Topical  Daily  . docusate  100 mg Oral BID  . folic acid  1 mg Oral Daily  . ipratropium-albuterol  3 mL Nebulization Q4H  . mouth rinse  15 mL Mouth Rinse 10 times per day  . methylPREDNISolone (SOLU-MEDROL) injection  40 mg Intravenous Q12H  . multivitamin with minerals  1 tablet Oral Daily  . pantoprazole (PROTONIX) IV  40 mg Intravenous Q24H  . rocuronium  50 mg Intravenous Once  . sodium chloride flush  3 mL Intravenous Q12H  . thiamine  100 mg Oral Daily   Or  . thiamine  100 mg Intravenous Daily   Continuous Infusions: . sodium chloride    . azithromycin Stopped (10/28/19 1715)  . cefTRIAXone (ROCEPHIN)  IV Stopped (10/28/19 1540)  . dextrose 5 % and 0.45% NaCl 150 mL/hr at 10/29/19 1003  . fentaNYL infusion INTRAVENOUS 200 mcg/hr (10/29/19 0646)  . propofol (DIPRIVAN) infusion 50 mcg/kg/min (10/29/19 1236)   PRN Meds:.sodium chloride, acetaminophen **OR** acetaminophen, HYDROmorphone (DILAUDID) injection, LORazepam, ondansetron **OR** ondansetron (ZOFRAN) IV, sodium chloride flush    Anti-infectives (From admission, onward)   Start     Dose/Rate Route Frequency Ordered Stop   10/28/19 1800  vancomycin (VANCOCIN) IVPB 1000 mg/200 mL premix  Status:  Discontinued        1,000 mg 200 mL/hr over 60 Minutes Intravenous Every 24 hours 10/27/19 1645 10/27/19 1956   10/28/19 1600  azithromycin (ZITHROMAX) 500 mg in sodium  chloride 0.9 % 250 mL IVPB        500 mg 250 mL/hr over 60 Minutes Intravenous Every 24 hours 10/27/19 1956     10/28/19 1600  cefTRIAXone (ROCEPHIN) 1 g in sodium chloride 0.9 % 100 mL IVPB        1 g 200 mL/hr over 30 Minutes Intravenous Every 24 hours 10/27/19 1956     10/27/19 1645  cefTRIAXone (ROCEPHIN) 1 g in sodium chloride 0.9 % 100 mL IVPB        1 g 200 mL/hr over 30 Minutes Intravenous  Once 10/27/19 1630 10/27/19 1826   10/27/19 1645  azithromycin (ZITHROMAX) 500 mg in sodium chloride 0.9 % 250 mL IVPB        500 mg 250 mL/hr over 60 Minutes  Intravenous  Once 10/27/19 1630 10/27/19 1957   10/27/19 1645  vancomycin (VANCOREADY) IVPB 1250 mg/250 mL        1,250 mg 166.7 mL/hr over 90 Minutes Intravenous  Once 10/27/19 1638 10/27/19 2015        Objective:   Vitals:   10/29/19 0800 10/29/19 0900 10/29/19 1000 10/29/19 1127  BP: 119/73 124/69 125/71   Pulse: (!) 104 (!) 102 (!) 102   Resp: (!) 35 (!) 32 (!) 33   Temp: 98.6 F (37 C)   98.7 F (37.1 C)  TempSrc: Oral   Oral  SpO2: 98% 98% 98% 98%  Weight:      Height:        Wt Readings from Last 3 Encounters:  10/29/19 54.4 kg  01/11/19 59 kg  01/09/15 65.8 kg     Intake/Output Summary (Last 24 hours) at 10/29/2019 1438 Last data filed at 10/29/2019 0958 Gross per 24 hour  Intake 3554.48 ml  Output 975 ml  Net 2579.48 ml    Physical Exam  Gen:-Intubated and sedated  HEENT:-ET tube  Neck-No JVD,.  Lungs--few scattered rhonchi, air movement is symmetrical CV- S1, S2 normal, regular  Abd-  +ve B.Sounds, Abd Soft, ND Extremity/Skin:- No  edema, pedal pulses present NeuroPsych-Limited exam as patient is intubated and sedated and on both fentanyl and propofol drips   Data Review:   Micro Results Recent Results (from the past 240 hour(s))  Blood Culture (routine x 2)     Status: None (Preliminary result)   Collection Time: 10/27/19  1:50 PM   Specimen: BLOOD LEFT HAND  Result Value Ref Range Status   Specimen Description BLOOD LEFT HAND  Final   Special Requests   Final    BOTTLES DRAWN AEROBIC AND ANAEROBIC Blood Culture results may not be optimal due to an inadequate volume of blood received in culture bottles   Culture   Final    NO GROWTH 2 DAYS Performed at [facility], [address] [place], [place] [address]    Report Status PENDING  Incomplete  SARS Coronavirus 2 by RT PCR (hospital order, performed in [facility] hospital lab) Nasopharyngeal Nasopharyngeal Swab     Status: None   Collection Time: 10/27/19  2:14 PM   Specimen: Nasopharyngeal  Swab  Result Value Ref Range Status   SARS Coronavirus 2 NEGATIVE NEGATIVE Final    Comment: (NOTE) SARS-CoV-2 target nucleic acids are NOT DETECTED.  The SARS-CoV-2 RNA is generally detectable in upper and lower respiratory specimens during the acute phase of infection. The lowest concentration of SARS-CoV-2 viral copies this assay can detect is 250 copies / mL. A negative result does not preclude SARS-CoV-2 infection  and should not be used as the sole basis for treatment or other patient management decisions.  A negative result may occur with improper specimen collection / handling, submission of specimen other than nasopharyngeal swab, presence of viral mutation(s) within the areas targeted by this assay, and inadequate number of viral copies (<250 copies / mL). A negative result must be combined with clinical observations, patient history, and epidemiological information.  Fact Sheet for Patients:   StrictlyIdeas.no  Fact Sheet for Healthcare Providers: BankingDealers.co.za  This test is not yet approved or  cleared by the [place] FDA and has been authorized for detection and/or diagnosis of SARS-CoV-2 by FDA under an Emergency Use Authorization (EUA).  This EUA will remain in effect (meaning this test can be used) for the duration of the COVID-19 declaration under Section 564(b)(1) of the Act, 21 U.S.C. section 360bbb-3(b)(1), unless the authorization is terminated or revoked sooner.  Performed at [facility], [address], [place], [place] [address]   Blood Culture (routine x 2)     Status: None (Preliminary result)   Collection Time: 10/27/19  2:39 PM   Specimen: BLOOD RIGHT HAND  Result Value Ref Range Status   Specimen Description BLOOD RIGHT HAND  Final   Special Requests   Final    BOTTLES DRAWN AEROBIC AND ANAEROBIC Blood Culture adequate volume   Culture   Final    NO GROWTH 2 DAYS Performed at [facility], [address] [place], [place] [address]    Report Status PENDING  Incomplete  MRSA PCR Screening     Status: None   Collection Time: 10/27/19  9:30 PM   Specimen: Nasopharyngeal  Result Value Ref Range Status   MRSA by PCR NEGATIVE NEGATIVE Final    Comment:        The GeneXpert MRSA Assay (FDA approved for NASAL specimens only), is one component of a comprehensive MRSA colonization surveillance program. It is not intended to diagnose MRSA infection nor to guide or monitor treatment for MRSA infections. Performed at [facility], [address] [place], [place] [address]   SARS Coronavirus 2 by RT PCR (hospital order, performed in [facility] hospital lab) Nasopharyngeal     Status: None   Collection Time: 10/27/19  9:32 PM   Specimen: Nasopharyngeal  Result Value Ref Range Status   SARS Coronavirus 2 NEGATIVE NEGATIVE Final    Comment: (NOTE) SARS-CoV-2 target nucleic acids are NOT DETECTED.  The SARS-CoV-2 RNA is generally detectable in upper and lower respiratory specimens during the acute phase of infection. The lowest concentration of SARS-CoV-2 viral copies this assay can detect is 250 copies / mL. A negative result does not preclude SARS-CoV-2 infection and should not be used as the sole basis for treatment or other patient management decisions.  A negative result may occur with improper specimen collection / handling, submission of specimen other than nasopharyngeal swab, presence of viral mutation(s) within the areas targeted by this assay, and inadequate number of viral copies (<250 copies / mL). A negative result must be combined with clinical observations, patient history, and epidemiological information.  Fact Sheet for Patients:   StrictlyIdeas.no  Fact Sheet for Healthcare Providers: BankingDealers.co.za  This test is not yet approved or  cleared by the [place] FDA and has been authorized for  detection and/or diagnosis of SARS-CoV-2 by FDA under an Emergency Use Authorization (EUA).  This EUA will remain in effect (meaning this test can be used) for the duration of the  COVID-19 declaration under Section 564(b)(1) of the Act, 21 U.S.C. section 360bbb-3(b)(1), unless the authorization is terminated or revoked sooner.  Performed at [facility], [address], [place], [place] [address]   Culture, respiratory (non-expectorated)     Status: None (Preliminary result)   Collection Time: 10/28/19  1:56 PM   Specimen: Tracheal Aspirate; Respiratory  Result Value Ref Range Status   Specimen Description   Final    TRACHEAL ASPIRATE Performed at [facility], [address] [place], [place] [address]    Special Requests   Final    Normal Performed at [address] [place], [place] [address]    Gram Stain   Final    MODERATE WBC PRESENT, PREDOMINANTLY PMN RARE SQUAMOUS EPITHELIAL CELLS PRESENT ABUNDANT YEAST FEW GRAM POSITIVE COCCI Performed at [facility], [place] [address] [place], [place] [address]    Culture PENDING  Incomplete   Report Status PENDING  Incomplete  SARS Coronavirus 2 by RT PCR (hospital order, performed in [facility] hospital lab) Nasopharyngeal Nasopharyngeal Swab     Status: None   Collection Time: 10/29/19  9:29 AM   Specimen: Nasopharyngeal Swab  Result Value Ref Range Status   SARS Coronavirus 2 NEGATIVE NEGATIVE Final    Comment: (NOTE) SARS-CoV-2 target nucleic acids are NOT DETECTED.  The SARS-CoV-2 RNA is generally detectable in upper and lower respiratory specimens during the acute phase of infection. The lowest concentration of SARS-CoV-2 viral copies this assay can detect is 250 copies / mL. A negative result does not preclude SARS-CoV-2 infection and should not be used as the sole basis for treatment or other patient management decisions.  A negative result may occur with improper specimen collection / handling,  submission of specimen other than nasopharyngeal swab, presence of viral mutation(s) within the areas targeted by this assay, and inadequate number of viral copies (<250 copies / mL). A negative result must be combined with clinical observations, patient history, and epidemiological information.  Fact Sheet for Patients:   StrictlyIdeas.no  Fact Sheet for Healthcare Providers: BankingDealers.co.za  This test is not yet approved or  cleared by the [place] FDA and has been authorized for detection and/or diagnosis of SARS-CoV-2 by FDA under an Emergency Use Authorization (EUA).  This EUA will remain in effect (meaning this test can be used) for the duration of the COVID-19 declaration under Section 564(b)(1) of the Act, 21 U.S.C. section 360bbb-3(b)(1), unless the authorization is terminated or revoked sooner.  Performed at [facility], [address] [place], [place] [address]     Radiology Reports CT Angio Chest PE W and/or Wo Contrast  Result Date: 10/28/2019 CLINICAL DATA:  34 year old female with history of shortness of breath for the past 4 days. Fever. EXAM: CT ANGIOGRAPHY CHEST WITH CONTRAST TECHNIQUE: Multidetector CT imaging of the chest was performed using the standard protocol during bolus administration of intravenous contrast. Multiplanar CT image reconstructions and MIPs were obtained to evaluate the vascular anatomy. CONTRAST:  4m OMNIPAQUE IOHEXOL 350 MG/ML SOLN COMPARISON:  Chest CT 01/09/2015. FINDINGS: Cardiovascular: Tiny nonobstructive filling defects are noted within subsegmental pulmonary artery branches to the right lower lobe (axial image 194 of series 5) and the left upper lobe (axial image 155 of series 5). No other larger central, lobar or segmental sized filling defects are noted. Heart size is normal. There is no significant pericardial fluid, thickening or pericardial calcification. No atherosclerotic  calcifications in the thoracic aorta or coronary arteries. Mediastinum/Nodes: No  pathologically enlarged mediastinal or hilar lymph nodes. Esophagus is unremarkable in appearance. No axillary lymphadenopathy. Lungs/Pleura: Widespread areas of severe ground-glass attenuation are noted scattered throughout the lungs bilaterally. No pleural effusions. Mild diffuse bronchial wall thickening with moderate centrilobular and paraseptal emphysema. Upper Abdomen: Unremarkable. Musculoskeletal: There are no aggressive appearing lytic or blastic lesions noted in the visualized portions of the skeleton. Review of the MIP images confirms the above findings. IMPRESSION: 1. Small nonocclusive pulmonary emboli in the lungs bilaterally, as above. 2. The appearance of the lungs is highly concerning for severe multilobar bilateral pneumonia, likely from atypical viral etiologies such as COVID infection. 3. Diffuse bronchial wall thickening with moderate centrilobular and paraseptal emphysema; imaging findings suggestive of underlying COPD. These results will be called to the ordering clinician or representative by the Radiologist Assistant, and communication documented in the PACS or [employer]. Emphysema (ICD10-J43.9). Electronically Signed   By: Vinnie Langton M.D.   On: 10/28/2019 09:37   US Abdomen Limited  Result Date: 10/29/2019 CLINICAL DATA:  Elevated LFTs EXAM: ULTRASOUND ABDOMEN LIMITED RIGHT UPPER QUADRANT COMPARISON:  January 11, 2019. FINDINGS: Gallbladder: Status post cholecystectomy. Common bile duct: Diameter: 2 mm, normal Liver: No focal lesion identified. Mildly heterogeneous liver echotexture. Portal vein is patent on color Doppler imaging with normal direction of blood flow towards the liver. Other: None. IMPRESSION: Mildly heterogeneous liver echotexture without focal lesion identified. Electronically Signed   By: Valentino Saxon MD   On: 10/29/2019 09:36   US Venous Img Lower Bilateral  (DVT)  Result Date: 10/28/2019 CLINICAL DATA:  Acute respiratory failure. Small volume bilateral pulmonary embolism. Evaluate for DVT. EXAM: BILATERAL LOWER EXTREMITY VENOUS DOPPLER ULTRASOUND TECHNIQUE: Gray-scale sonography with graded compression, as well as color Doppler and duplex ultrasound were performed to evaluate the lower extremity deep venous systems from the level of the common femoral vein and including the common femoral, femoral, profunda femoral, popliteal and calf veins including the posterior tibial, peroneal and gastrocnemius veins when visible. The superficial great saphenous vein was also interrogated. Spectral Doppler was utilized to evaluate flow at rest and with distal augmentation maneuvers in the common femoral, femoral and popliteal veins. COMPARISON:  None. FINDINGS: RIGHT LOWER EXTREMITY Common Femoral Vein: No evidence of thrombus. Normal compressibility, respiratory phasicity and response to augmentation. Saphenofemoral Junction: No evidence of thrombus. Normal compressibility and flow on color Doppler imaging. Profunda Femoral Vein: No evidence of thrombus. Normal compressibility and flow on color Doppler imaging. Femoral Vein: No evidence of thrombus. Normal compressibility, respiratory phasicity and response to augmentation. Popliteal Vein: No evidence of thrombus. Normal compressibility, respiratory phasicity and response to augmentation. Calf Veins: No evidence of thrombus. Normal compressibility and flow on color Doppler imaging. Superficial Great Saphenous Vein: No evidence of thrombus. Normal compressibility. Venous Reflux:  None. Other Findings:  None. LEFT LOWER EXTREMITY Common Femoral Vein: No evidence of thrombus. Normal compressibility, respiratory phasicity and response to augmentation. Saphenofemoral Junction: No evidence of thrombus. Normal compressibility and flow on color Doppler imaging. Profunda Femoral Vein: No evidence of thrombus. Normal compressibility and  flow on color Doppler imaging. Femoral Vein: No evidence of thrombus. Normal compressibility, respiratory phasicity and response to augmentation. Popliteal Vein: No evidence of thrombus. Normal compressibility, respiratory phasicity and response to augmentation. Calf Veins: No evidence of thrombus. Normal compressibility and flow on color Doppler imaging. Superficial Great Saphenous Vein: No evidence of thrombus. Normal compressibility. Venous Reflux:  None. Other Findings:  None. IMPRESSION: No evidence of DVT within either lower extremity.  Electronically Signed   By: Sandi Mariscal M.D.   On: 10/28/2019 16:50   DG CHEST PORT 1 VIEW  Result Date: 10/29/2019 CLINICAL DATA:  34 year old female with respiratory failure hypoxia. EXAM: PORTABLE CHEST 1 VIEW COMPARISON:  Chest radiograph dated 10/28/2019 and CT dated 10/28/2019. FINDINGS: Endotracheal tube above the carina and enteric tube extends below the diaphragm with tip the on the inferior margin of the image and side-port in the region of the stomach. Bilateral mid to lower lung field airspace opacities similar to prior radiograph. No large pleural effusion. No pneumothorax. Mild cardiomegaly. No acute osseous pathology. IMPRESSION: 1. No significant interval change in bilateral mid to lower lung field airspace opacities. 2. Stable support apparatus. Electronically Signed   By: Anner Crete M.D.   On: 10/29/2019 03:46   DG Chest Portable 1 View  Result Date: 10/28/2019 CLINICAL DATA:  Status post intubation today. EXAM: PORTABLE CHEST 1 VIEW COMPARISON:  Single-view of the chest 10/27/2019. FINDINGS: New NG tube is in place in good position with the side-port in the stomach. Endotracheal tube is also in good position with the tip just below the clavicular heads. Right worse than left airspace disease has progressed since yesterday's exam. No pneumothorax or pleural fluid. Heart size normal. IMPRESSION: ETT and NG tube in good position. Worsened airspace  disease consistent with progressive pneumonia. Electronically Signed   By: Inge Rise M.D.   On: 10/28/2019 11:42   DG Chest Port 1 View  Result Date: 10/27/2019 CLINICAL DATA:  Shortness of breath. EXAM: PORTABLE CHEST 1 VIEW COMPARISON:  January 09, 2015. FINDINGS: The heart size and mediastinal contours are within normal limits. No pneumothorax or pleural effusion is noted. Multiple ill-defined airspace opacities are noted bilaterally concerning for multifocal pneumonia. The visualized skeletal structures are unremarkable. IMPRESSION: Bilateral multifocal pneumonia. Electronically Signed   By: Marijo Conception M.D.   On: 10/27/2019 14:27   ECHOCARDIOGRAM COMPLETE  Result Date: 10/28/2019    ECHOCARDIOGRAM REPORT   Patient Name:   MICHAEL VENTRESCA Date of Exam: 10/28/2019 Medical Rec #:  [id number]       Height:       64.0 in Accession #:    [id number]      Weight:       120.0 lb Date of Birth:  01-28-1986        BSA:          1.575 m Patient Age:    76 years        BP:           130/94 mmHg Patient Gender: F               HR:           116 bpm. Exam Location:  [facility] Procedure: 2D Echo, Cardiac Doppler and Color Doppler Indications:    Pulmonary Embolus 415.19 / I26.99  History:        Patient has no prior history of Echocardiogram examinations.                 Multifocal pneumonia, Acute respiratory failure with hypoxia.  Sonographer:    Alvino Chapel RCS Referring Phys: [phone] Lateria Alderman IMPRESSIONS  1. Left ventricular ejection fraction, by estimation, is 55 to 60%. The left ventricle has normal function. The left ventricle has no regional wall motion abnormalities. Left ventricular diastolic parameters are indeterminate.  2. Right ventricular systolic function is moderately reduced. The right ventricular size is severely enlarged.  There is moderately elevated pulmonary artery systolic pressure.  3. The mitral valve is normal in structure. Mild mitral valve regurgitation. No evidence of  mitral stenosis.  4. The aortic valve has an indeterminant number of cusps. Aortic valve regurgitation is not visualized. No aortic stenosis is present. FINDINGS  Left Ventricle: Left ventricular ejection fraction, by estimation, is 55 to 60%. The left ventricle has normal function. The left ventricle has no regional wall motion abnormalities. The left ventricular internal cavity size was normal in size. There is  no left ventricular hypertrophy. Left ventricular diastolic parameters are indeterminate. Right Ventricle: There is septal flattening in systole. The right ventricular size is severely enlarged. No increase in right ventricular wall thickness. Right ventricular systolic function is moderately reduced. There is moderately elevated pulmonary artery systolic pressure. The tricuspid regurgitant velocity is 3.23 m/s, and with an assumed right atrial pressure of 10 mmHg, the estimated right ventricular systolic pressure is 56.8 mmHg. Left Atrium: Left atrial size was normal in size. Right Atrium: Right atrial size was normal in size. Pericardium: There is no evidence of pericardial effusion. Mitral Valve: The mitral valve is normal in structure. Mild mitral valve regurgitation. No evidence of mitral valve stenosis. Tricuspid Valve: The tricuspid valve is normal in structure. Tricuspid valve regurgitation is mild . No evidence of tricuspid stenosis. Aortic Valve: The aortic valve has an indeterminant number of cusps. Aortic valve regurgitation is not visualized. No aortic stenosis is present. Aortic valve mean gradient measures 2.7 mmHg. Aortic valve peak gradient measures 4.9 mmHg. Aortic valve area, by VTI measures 1.50 cm. Pulmonic Valve: The pulmonic valve was not well visualized. Pulmonic valve regurgitation is not visualized. No evidence of pulmonic stenosis. Aorta: The aortic root is normal in size and structure. Venous: IVC assessment for right atrial pressure unable to be performed due to mechanical  ventilation. IAS/Shunts: No atrial level shunt detected by color flow Doppler.  LEFT VENTRICLE PLAX 2D LVIDd:         3.94 cm  Diastology LVIDs:         2.98 cm  LV e' lateral:   19.90 cm/s LV PW:         0.80 cm  LV E/e' lateral: 4.6 LV IVS:        0.83 cm  LV e' medial:    14.40 cm/s LVOT diam:     1.70 cm  LV E/e' medial:  6.3 LV SV:         27 LV SV Index:   17 LVOT Area:     2.27 cm  RIGHT VENTRICLE RV S prime:     15.70 cm/s TAPSE (M-mode): 2.2 cm LEFT ATRIUM           Index       RIGHT ATRIUM           Index LA diam:      2.20 cm 1.40 cm/m  RA Area:     17.40 cm LA Vol (A4C): 34.9 ml 22.16 ml/m RA Volume:   48.50 ml  30.80 ml/m  AORTIC VALVE AV Area (Vmax):    1.78 cm AV Area (Vmean):   1.50 cm AV Area (VTI):     1.50 cm AV Vmax:           110.51 cm/s AV Vmean:          78.791 cm/s AV VTI:            0.178 m AV Peak Grad:  4.9 mmHg AV Mean Grad:      2.7 mmHg LVOT Vmax:         86.50 cm/s LVOT Vmean:        52.200 cm/s LVOT VTI:          0.117 m LVOT/AV VTI ratio: 0.66  AORTA Ao Root diam: 3.10 cm MITRAL VALVE               TRICUSPID VALVE MV Area (PHT): 6.96 cm    TR Peak grad:   41.7 mmHg MV Decel Time: 109 msec    TR Vmax:        323.00 cm/s MV E velocity: 90.90 cm/s                            SHUNTS                            Systemic VTI:  0.12 m                            Systemic Diam: 1.70 cm Carlyle Dolly MD Electronically signed by Carlyle Dolly MD Signature Date/Time: 10/28/2019/3:31:55 PM    Final      CBC Recent Labs  Lab 10/27/19 1438 10/27/19 1438 10/28/19 0331 10/28/19 0941 10/29/19 0408 10/29/19 1157 10/29/19 1337  WBC 13.8*  --  8.3 11.8* 18.0* 16.4*  --   HGB 10.3*  --  10.8* 10.6* 9.7* 8.8*  --   HCT 31.3*  --  33.5* 32.6* 30.2* 27.4*  --   PLT 217   < > 142* 116* 39* 37* 36*  MCV 83.5  --  85.0 84.2 84.1 84.6  --   MCH 27.5  --  27.4 27.4 27.0 27.2  --   MCHC 32.9  --  32.2 32.5 32.1 32.1  --   RDW 19.7*  --  19.9* 20.0* 21.6* 21.1*  --   LYMPHSABS 0.7   --   --   --   --   --   --   MONOABS 0.3  --   --   --   --   --   --   EOSABS 0.1  --   --   --   --   --   --   BASOSABS 0.1  --   --   --   --   --   --    < > = values in this interval not displayed.    Chemistries  Recent Labs  Lab 10/27/19 1438 10/28/19 0331 10/28/19 0941 10/29/19 0408  NA 135 138 137 138  K 2.7* 4.0 4.1 3.9  CL 106 112* 112* 113*  CO2 16* 15* 15* 17*  GLUCOSE 96 125* 121* 150*  BUN 19 18 18  24*  CREATININE 0.89 0.66 0.65 0.71  CALCIUM 8.0* 7.7* 7.8* 7.7*  MG 2.0 2.6*  --   --   AST 26 26 22  123*  ALT 12 16 15  59*  ALKPHOS 127* 162* 150* 130*  BILITOT 0.5 0.8 0.8 0.6   ------------------------------------------------------------------------------------------------------------------ Recent Labs    10/27/19 1438 10/29/19 0408  TRIG 140 213*    No results found for: HGBA1C ------------------------------------------------------------------------------------------------------------------ No results for input(s): TSH, T4TOTAL, T3FREE, THYROIDAB in the last 72 hours.  Invalid input(s): FREET3 ------------------------------------------------------------------------------------------------------------------ Recent Labs    10/27/19 1438  FERRITIN 49  Coagulation profile Recent Labs  Lab 10/29/19 1337  INR 1.5*    Recent Labs    10/27/19 1438 10/29/19 1337  DDIMER >20.00* >20.00*    Cardiac Enzymes No results for input(s): CKMB, TROPONINI, MYOGLOBIN in the last 168 hours.  Invalid input(s): CK ------------------------------------------------------------------------------------------------------------------    Component Value Date/Time   BNP 271.0 (H) 10/27/2019 1438   Roxan Hockey M.D on 10/29/2019 at 2:38 PM  Go to [email or web address] - for contact info  [facility] - Office  [phone]

## 2019-10-29 NOTE — Progress Notes (Signed)
ANTICOAGULATION CONSULT NOTE   Pharmacy Consult for heparin  Indication: pulmonary embolus  Allergies  Allergen Reactions  . Bactrim [Sulfamethoxazole-Trimethoprim] Nausea And Vomiting  . Ciprofloxacin Nausea And Vomiting  . Darvocet [Propoxyphene N-Acetaminophen] Hives  . Other     darvocet  . Penicillins Itching  . Penicillins Hives  . Bactrim [Sulfamethoxazole-Trimethoprim] Rash  . Ciprofloxacin Rash    Patient Measurements: Height: 5\' 4"  (162.6 cm) Weight: 54.4 kg (119 lb 14.9 oz) IBW/kg (Calculated) : 54.7 Heparin Dosing Weight: HEPARIN DW (KG): 54.4   Vital Signs: Temp: 99.6 F (37.6 C) (08/26 0405) Temp Source: Axillary (08/26 0405) BP: 133/74 (08/26 0545) Pulse Rate: 103 (08/26 0545)  Labs: Recent Labs    10/28/19 0331 10/28/19 0331 10/28/19 0941 10/29/19 0408  HGB 10.8*   < > 10.6* 9.7*  HCT 33.5*  --  32.6* 30.2*  PLT 142*  --  116* 39*  HEPARINUNFRC  --   --   --  <0.10*  CREATININE 0.66  --  0.65 0.71   < > = values in this interval not displayed.    Estimated Creatinine Clearance: 85.1 mL/min (by C-G formula based on SCr of 0.71 mg/dL).  Assessment: Diane Norton a 34 y.o. female requires anticoagulation with a heparin iv infusion for the indication of  pulmonary embolus. Heparin gtt will be started following pharmacy protocol per pharmacy consult.   Heparin level undetectable on gtt at 800 units/hr. No issues with line or bleeding reported per RN.  Goal of Therapy:  Heparin level 0.3-0.7 units/ml Monitor platelets by anticoagulation protocol: Yes   Plan:  Rebolus 1500 units Increase heparin gtt to 1150 units/hr. F/u 6hr heparin level  20, PharmD, BCPS Please see amion for complete clinical pharmacist phone list 10/29/2019,6:28 AM

## 2019-10-29 NOTE — Progress Notes (Signed)
Initial Nutrition Assessment  DOCUMENTATION CODES:   Not applicable  INTERVENTION:  If unable to extubate within 24 hrs and if appropriate recommend initiating  -Vital 1.2 @ 40 ml/hr with 45 ml ProSource TF 3x daily via OG tube (OGT to LIS at this time) -MVI with minerals daily via tube  This regimen at goal rate 40 ml/hr will provide 1272 kcal (1703 total kcal with propofol at current rate; meets 101% of estimated kcal needs), 105 grams of protein, and 778 ml free water  Additional free water per MD  NUTRITION DIAGNOSIS:   Inadequate oral intake related to inability to eat as evidenced by NPO status.  GOAL:   Provide needs based on ASPEN/SCCM guidelines   MONITOR:   Labs, I & O's, Vent status, Weight trends, Diet advancement, Skin  REASON FOR ASSESSMENT:   Ventilator    ASSESSMENT:  RD working remotely.  34 year old female with history significant for asthma, seizures, ongoing heroin abuse presented with 4 days of worsening SOB and subjective fevers with chills admitted for acute respiratory failure with hypoxia.  In ED, pt with worsening hypoxia on 15 L  humidified, more agitated and confused requiring intubation on 8/25.   I/O: +3776.8 ml since admit  OGT to LIS - 350 ml x 24 hrs   UOP: 1425 ml x 24 hrs    BP: 103/55 (cuff) MAP: 71 (cuff)  No recent wt history for review  Patient is currently sedated and intubated on ventilator support MV: 14.2 L/min Temp (24hrs), Avg:98.6 F (37 C), Min:97.8 F (36.6 C), Max:99.6 F (37.6 C)  Propofol: 16.32 ml/hr providing 431 kcal Medications reviewed and include: Colace, Folic acid, Methylprednisolone, Protonix, Rocuronium, B1 Drips: Zithromax, Rocephin Fentanyl 25 mcg IVF: D5 NaCl @ 150 ml/hr  Labs: CBG 156, BUN 24 (H), Corrected Ca 9.14, WBC 16.4 (H), Hgb 8.8 (L), HCT 27.4 (L), K 3.9 (WNL) 8/24 BNP 271 (H) 8/25 Mg 2.5 (H)  Per notes: -small nonocclusive PE in bilateral lungs -severe bilateral  pna -breathing above vent at 25 so set to 24, continue sedation -Hgb trending down; IV heparin stopped; -hematology consult, empirically treat with FFP and cryoprecipitate - DIC likely, stop current antibiotics, start meropenem and doxycyline -multiple allergies limiting antibiotic choice  NUTRITION - FOCUSED PHYSICAL EXAM: Unable to perform at this time, RD working remotely.  Diet Order:   Diet Order            Diet NPO time specified  Diet effective now                 EDUCATION NEEDS:   No education needs have been identified at this time  Skin:  Skin Assessment: Reviewed RN Assessment  Last BM:  unknown  Height:   Ht Readings from Last 1 Encounters:  10/27/19 5\' 4"  (1.626 m)    Weight:   Wt Readings from Last 1 Encounters:  10/29/19 54.4 kg    Ideal Body Weight:  54.5 kg  BMI:  Body mass index is 20.59 kg/m.  Estimated Nutritional Needs:   Kcal:  1690  Protein:  82-93  Fluid:  >/= 1.6 L/day   10/31/19, RD, LDN Clinical Nutrition After Hours/Weekend Pager # in Amion

## 2019-10-29 NOTE — Progress Notes (Signed)
   HIT and DIC panel results reviewed with Dr. [name] --DIC is more likely than HIT given the clinical presentation and lab findings -He advised stopping IV heparin, he advised against argatroban -Instead we will treat empirically with FFP and cryoprecipitate to reduce bleeding risk --Since DIC, treatment of antibiotics we will stop azithromycin and Rocephin which was started on admission and instead use meropenem and doxycycline -Patient has multiple allergies limiting choices of antibiotics  Shon Hale, MD

## 2019-10-29 NOTE — Progress Notes (Signed)
Pt has continued to breathe over the set rate of 24 on the vent settings. She is maxed out of propofol and this RN has been giving her prn ativan and dilaudid with no decrease is respiratory rate.  Upon admission, she tested positive for opiates.  Paged hospitalist for possible orders due to rate in the 40's. Respiratory has assessed patient as well.

## 2019-10-30 DIAGNOSIS — I2609 Other pulmonary embolism with acute cor pulmonale: Secondary | ICD-10-CM

## 2019-10-30 LAB — PREPARE CRYOPRECIPITATE: Unit division: 0

## 2019-10-30 LAB — CBC
HCT: 24 % — ABNORMAL LOW (ref 36.0–46.0)
HCT: 24.1 % — ABNORMAL LOW (ref 36.0–46.0)
Hemoglobin: 7.8 g/dL — ABNORMAL LOW (ref 12.0–15.0)
Hemoglobin: 7.5 g/dL — ABNORMAL LOW (ref 12.0–15.0)
MCH: 27.5 pg (ref 26.0–34.0)
MCH: 27.1 pg (ref 26.0–34.0)
MCHC: 32.5 g/dL (ref 30.0–36.0)
MCHC: 31.1 g/dL (ref 30.0–36.0)
MCV: 84.5 fL (ref 80.0–100.0)
MCV: 87 fL (ref 80.0–100.0)
Platelets: 38 10*3/uL — ABNORMAL LOW (ref 150–400)
Platelets: 45 10*3/uL — ABNORMAL LOW (ref 150–400)
RBC: 2.84 MIL/uL — ABNORMAL LOW (ref 3.87–5.11)
RBC: 2.77 MIL/uL — ABNORMAL LOW (ref 3.87–5.11)
RDW: 20.4 % — ABNORMAL HIGH (ref 11.5–15.5)
RDW: 20.3 % — ABNORMAL HIGH (ref 11.5–15.5)
WBC: 10.4 10*3/uL (ref 4.0–10.5)
WBC: 10.1 10*3/uL (ref 4.0–10.5)
nRBC: 0.3 % — ABNORMAL HIGH (ref 0.0–0.2)
nRBC: 0.8 % — ABNORMAL HIGH (ref 0.0–0.2)

## 2019-10-30 LAB — BPAM FFP
Blood Product Expiration Date: 202108312359
Blood Product Expiration Date: 202108312359
ISSUE DATE / TIME: 202108261714
ISSUE DATE / TIME: 202108261840
Unit Type and Rh: 5100
Unit Type and Rh: 6200

## 2019-10-30 LAB — COMPREHENSIVE METABOLIC PANEL
ALT: 43 U/L (ref 0–44)
ALT: 43 U/L (ref 0–44)
AST: 32 U/L (ref 15–41)
AST: 34 U/L (ref 15–41)
Albumin: 2.6 g/dL — ABNORMAL LOW (ref 3.5–5.0)
Albumin: 2.4 g/dL — ABNORMAL LOW (ref 3.5–5.0)
Alkaline Phosphatase: 97 U/L (ref 38–126)
Alkaline Phosphatase: 92 U/L (ref 38–126)
Anion gap: 6 (ref 5–15)
Anion gap: 6 (ref 5–15)
BUN: 11 mg/dL (ref 6–20)
BUN: 9 mg/dL (ref 6–20)
CO2: 23 mmol/L (ref 22–32)
CO2: 23 mmol/L (ref 22–32)
Calcium: 7.7 mg/dL — ABNORMAL LOW (ref 8.9–10.3)
Calcium: 7.5 mg/dL — ABNORMAL LOW (ref 8.9–10.3)
Chloride: 111 mmol/L (ref 98–111)
Chloride: 112 mmol/L — ABNORMAL HIGH (ref 98–111)
Creatinine, Ser: 0.5 mg/dL (ref 0.44–1.00)
Creatinine, Ser: 0.53 mg/dL (ref 0.44–1.00)
GFR calc Af Amer: >60 mL/min (ref >60)
GFR calc Af Amer: >60 mL/min (ref >60)
GFR calc non Af Amer: >60 mL/min (ref >60)
GFR calc non Af Amer: >60 mL/min (ref >60)
Glucose, Bld: 124 mg/dL — ABNORMAL HIGH (ref 70–99)
Glucose, Bld: 101 mg/dL — ABNORMAL HIGH (ref 70–99)
Potassium: 3.4 mmol/L — ABNORMAL LOW (ref 3.5–5.1)
Potassium: 3.1 mmol/L — ABNORMAL LOW (ref 3.5–5.1)
Sodium: 140 mmol/L (ref 135–145)
Sodium: 141 mmol/L (ref 135–145)
Total Bilirubin: 0.4 mg/dL (ref 0.3–1.2)
Total Bilirubin: 0.6 mg/dL (ref 0.3–1.2)
Total Protein: 5.1 g/dL — ABNORMAL LOW (ref 6.5–8.1)
Total Protein: 4.7 g/dL — ABNORMAL LOW (ref 6.5–8.1)

## 2019-10-30 LAB — PHOSPHORUS
Phosphorus: 1.2 mg/dL — ABNORMAL LOW (ref 2.5–4.6)
Phosphorus: 1.7 mg/dL — ABNORMAL LOW (ref 2.5–4.6)

## 2019-10-30 LAB — PREPARE FRESH FROZEN PLASMA
Unit division: 0
Unit division: 0

## 2019-10-30 LAB — GLUCOSE, CAPILLARY
Glucose-Capillary: 115 mg/dL — ABNORMAL HIGH (ref 70–99)
Glucose-Capillary: 118 mg/dL — ABNORMAL HIGH (ref 70–99)
Glucose-Capillary: 103 mg/dL — ABNORMAL HIGH (ref 70–99)
Glucose-Capillary: 105 mg/dL — ABNORMAL HIGH (ref 70–99)
Glucose-Capillary: 131 mg/dL — ABNORMAL HIGH (ref 70–99)

## 2019-10-30 LAB — HEPARIN INDUCED PLATELET AB (HIT ANTIBODY): Heparin Induced Plt Ab: 0.099 OD (ref 0.000–0.400)

## 2019-10-30 LAB — MAGNESIUM: Magnesium: 1.9 mg/dL (ref 1.7–2.4)

## 2019-10-30 LAB — BPAM CRYOPRECIPITATE
Blood Product Expiration Date: 202108262242
ISSUE DATE / TIME: 202108262201
Unit Type and Rh: 5100

## 2019-10-30 LAB — APTT: aPTT: 28 seconds (ref 24–36)

## 2019-10-30 LAB — PROCALCITONIN: Procalcitonin: 4.68 ng/mL

## 2019-10-30 LAB — PROTIME-INR
INR: 1.4 — ABNORMAL HIGH (ref 0.8–1.2)
INR: 1.4 — ABNORMAL HIGH (ref 0.8–1.2)
Prothrombin Time: 16.4 seconds — ABNORMAL HIGH (ref 11.4–15.2)
Prothrombin Time: 16.8 seconds — ABNORMAL HIGH (ref 11.4–15.2)

## 2019-10-30 LAB — FIBRINOGEN: Fibrinogen: 127 mg/dL — ABNORMAL LOW (ref 210–475)

## 2019-10-30 LAB — LACTATE DEHYDROGENASE: LDH: 532 U/L — ABNORMAL HIGH (ref 98–192)

## 2019-10-30 LAB — TRIGLYCERIDES: Triglycerides: 136 mg/dL (ref <150)

## 2019-10-30 LAB — PATHOLOGIST SMEAR REVIEW

## 2019-10-30 MED ORDER — CLONIDINE HCL 0.1 MG PO TABS
0.1000 mg | ORAL_TABLET | Freq: Three times a day (TID) | ORAL | Status: DC
  Administered 2019-10-30 – 2019-11-03 (×10): 0.1 mg
  Filled 2019-10-30 (×11): qty 1

## 2019-10-30 MED ORDER — DOCUSATE SODIUM 50 MG/5ML PO LIQD
100.0000 mg | Freq: Two times a day (BID) | ORAL | Status: DC
  Administered 2019-10-31 – 2019-11-03 (×7): 100 mg
  Filled 2019-10-30 (×11): qty 10

## 2019-10-30 MED ORDER — VITAL AF 1.2 CAL PO LIQD
1000.0000 mL | ORAL | Status: DC
  Administered 2019-10-30 – 2019-11-02 (×4): 1000 mL

## 2019-10-30 MED ORDER — VITAMIN K1 10 MG/ML IJ SOLN
5.0000 mg | Freq: Once | INTRAVENOUS | Status: AC
  Administered 2019-10-30: 5 mg via INTRAVENOUS
  Filled 2019-10-30: qty 0.5

## 2019-10-30 MED ORDER — PROSOURCE TF PO LIQD
45.0000 mL | Freq: Three times a day (TID) | ORAL | Status: DC
  Administered 2019-10-30 – 2019-11-03 (×13): 45 mL
  Filled 2019-10-30 (×13): qty 45

## 2019-10-30 MED ORDER — POTASSIUM CHLORIDE 10 MEQ/100ML IV SOLN
10.0000 meq | INTRAVENOUS | Status: AC
  Administered 2019-10-30 (×4): 10 meq via INTRAVENOUS
  Filled 2019-10-30 (×4): qty 100

## 2019-10-30 MED ORDER — POTASSIUM CHLORIDE 20 MEQ/15ML (10%) PO SOLN
40.0000 meq | Freq: Once | ORAL | Status: AC
  Administered 2019-10-30: 40 meq
  Filled 2019-10-30: qty 30

## 2019-10-30 MED ORDER — POTASSIUM PHOSPHATES 15 MMOLE/5ML IV SOLN
30.0000 mmol | Freq: Once | INTRAVENOUS | Status: AC
  Administered 2019-10-30: 30 mmol via INTRAVENOUS
  Filled 2019-10-30: qty 10

## 2019-10-30 MED ORDER — ALBUTEROL SULFATE (2.5 MG/3ML) 0.083% IN NEBU
INHALATION_SOLUTION | RESPIRATORY_TRACT | Status: AC
  Filled 2019-10-30: qty 3

## 2019-10-30 MED ORDER — PANTOPRAZOLE SODIUM 40 MG PO PACK
40.0000 mg | PACK | Freq: Every day | ORAL | Status: DC
  Administered 2019-10-31 – 2019-11-03 (×4): 40 mg
  Filled 2019-10-30 (×3): qty 20

## 2019-10-30 NOTE — Progress Notes (Signed)
[name], MRN:  [id number], DOB:  [birth year], LOS: 3 ADMISSION DATE:  10/27/2019, CONSULTATION DATE:  8/25 REFERRING MD:  Margot Chimes, CHIEF COMPLAINT:  Acute resp failure   Brief History   18 yowf smoker with h/o asthma with 4 days of worse sob PTA assoc dry cough ? Fever/multiple chills presented with sats in 60s improved on 02 initially then req intubation 8/25 and PCCM asked to eval at that point   History of present illness   Pt intubated/ hx per HP HPI: Diane Norton is a 34 y.o. female with medical history significant for asthma, seizures, tobacco abuse.  Patient was brought to the ED via EMS for reports of increasing difficulty breathing over the past 4 days.  She also reports a dry cough.  Also reported possible fevers and chills.  She smokes 2 pack of cigarettes daily. She denies chest pain, no leg swelling, no recent trips.  Patient got [place] Covid vaccine 2 doses in March.  No known sick contacts.  No vomiting.  She reports some mild wheezing.  She has never required intubation for asthma before, or been this ill before. On EMS arrival, patient's O2 sats was 65%, she did not appear to be in any distress.  ED Course: temp 98.4, respiratory rate 16 - 24, heart rate 81 - 96, blood pressure systolic 00-9 08.  O2 sats 70% on 5 L, currently on high flow nasal cough is a 15 L.  Covid test negative.  WBC 13.8.  Potassium 2.7.  Magnesium normal 2.  Normal lactic acid 1.3.  Inflammatory markers checked, D-dimer markedly elevated at greater than 20.  Procalcitonin 2. Portable chest x-ray shows bilateral multifocal pneumonia.  Started on IV vancomycin, ceftriaxone and azithromycin.  Started on IV fluids.  Potassium repleted.  Magnesium given.  CTA chest ordered and pending.  Hospitalist to admit for further evaluation and management.   Past Medical History  Asthma SZ   Significant Hospital Events     Consults:  PCCM 8/25  Procedures:  Oral ET  8/25  >>>  Significant Diagnostic Tests:  CTa 8/25 >>> 1. Small nonocclusive pulmonary emboli in the lungs bilaterally, as above. The appearance of the lungs is highly concerning for severe multilobar bilateral pneumonia Echo 8/25 1. Left ventricular ejection fraction, by estimation, is 55 to 60%. The  left ventricle has normal function. The left ventricle has no regional  wall motion abnormalities. Left ventricular diastolic parameters are  indeterminate.  2. Right ventricular systolic function is moderately reduced. The right  ventricular size is severely enlarged. There is moderately elevated  pulmonary artery systolic pressure.  3. The mitral valve is normal in structure. Mild mitral valve  regurgitation. No evidence of mitral stenosis.  Venous dopplers 8/25 neg    Micro Data:  BC x 2  8/24 MRSA PCR  8/24  Covid 19 PCR  8/24  And 8/26  Neg x 3  PCT   8/24    2.08  Urine legionella   8/25  Neg  Urine Strep  8/25  Neg  ET 8/25  Mod wbc, gpc/yeast >>>    Antimicrobials:  Vanc 8/24  Rocephin 8/24 >>> 8/27 Zmax 8/24 >>> 8/27  Doxy 8/27 >>>   Meripenum 8/27 >>>    Scheduled Meds: . chlorhexidine gluconate (MEDLINE KIT)  15 mL Mouth Rinse BID  . Chlorhexidine Gluconate Cloth  6 each Topical Daily  . docusate  100 mg Oral BID  . feeding supplement (  PROSource TF)  45 mL Per Tube TID  . feeding supplement (VITAL AF 1.2 CAL)  1,000 mL Per Tube Q24H  . folic acid  1 mg Oral Daily  . ipratropium-albuterol  3 mL Nebulization Q4H  . mouth rinse  15 mL Mouth Rinse 10 times per day  . methylPREDNISolone (SOLU-MEDROL) injection  40 mg Intravenous Q12H  . multivitamin with minerals  1 tablet Oral Daily  . pantoprazole (PROTONIX) IV  40 mg Intravenous Q24H  . rocuronium  50 mg Intravenous Once  . sodium chloride flush  3 mL Intravenous Q12H  . thiamine  100 mg Oral Daily   Or  . thiamine  100 mg Intravenous Daily   Continuous Infusions: . sodium chloride    . dextrose 5 % and  0.45% NaCl 150 mL/hr at 10/30/19 1209  . doxycycline (VIBRAMYCIN) IV Stopped (10/30/19 0600)  . fentaNYL infusion INTRAVENOUS Stopped (10/30/19 0756)  . meropenem (MERREM) IV 200 mL/hr at 10/30/19 1110  . propofol (DIPRIVAN) infusion 50 mcg/kg/min (10/30/19 1110)   PRN Meds:.sodium chloride, acetaminophen **OR** acetaminophen, HYDROmorphone (DILAUDID) injection, LORazepam, ondansetron **OR** ondansetron (ZOFRAN) IV, sodium chloride flush     Interim history/subjective:  Tolerated weaning in term of sats but quite agitated on wua   Objective   Blood pressure 121/67, pulse (!) 106, temperature 98.5 F (36.9 C), temperature source Axillary, resp. rate 19, height 5' 4"  (1.626 m), weight 66.9 kg, last menstrual period 10/26/2019, SpO2 95 %.    Vent Mode: PRVC FiO2 (%):  [40 %] 40 % Set Rate:  [24 bmp] 24 bmp Vt Set:  [430 mL] 430 mL PEEP:  [5 cmH20] 5 cmH20 Plateau Pressure:  [11 cmH20-23 cmH20] 11 cmH20   Intake/Output Summary (Last 24 hours) at 10/30/2019 1244 Last data filed at 10/30/2019 1110 Gross per 24 hour  Intake 4592.78 ml  Output 2700 ml  Net 1892.78 ml   Filed Weights   10/27/19 1342 10/29/19 0405 10/30/19 0615  Weight: 54.4 kg 54.4 kg 66.9 kg    Examination: Tmax  98.9  Pt sedated  ET  Neck supple Lungs with a few scattered  Exp and insp rhonchi bilaterally/ secretions bloody  RRR no s3 or or sign murmur Abd  Soft/ nl excursion and tf started  Extr warm with no edema or clubbing noted          Resolved Hospital Problem list     Assessment & Plan:  1)  Acute CAP in pt with Asthma/ active wheezing  - see micro/abx flowsheet - PCT trend in wrong direction as of 8/27> changed abx as above, all micro still neg     2) Acute vent dep resp failure secondary to cap/asthma >>> airtrapping when breath over 25 so set at 24 >> continue sedation/ present vent settings, may wish to wean on precedex/ fentanyl if otherwise meets criteria rather than do full wua as too  agitated to wean  - will add clondine tid in anticipation of more weaning 8/28  - still some air trapping pm 8/27 but not significant clinically   3) Multiple PE not typically assoc with ards pattern x in pts with collagen vasc dz eg lupus / Antiphospholipid Syndrome (APS)  -  venous dopplers  Neg 8/25 - Echo suggests PE may not be  acute but definitely affecting RH function  - holding anticoagulation due to severe thrombocytopenia/ dic    4) Thromocytopenia  - see DIC profile 8/26 confirmed with hematology c/w dic  Lab Results  Component Value Date   PLT 38 (L) 10/30/2019   PLT 36 (L) 10/29/2019   PLT 37 (L) 10/29/2019     hopefully nadired reached   5) Anemia   Lab Results  Component Value Date   HGB 7.8 (L) 10/30/2019   HGB 8.8 (L) 10/29/2019   HGB 9.7 (L) 10/29/2019    C/w critical ilness ? Some alveolar hemmorrhage as well as no other obvious bleeding source Rec > tx for  hgb < 7 unless hemodynamically unstable   Hypophophatemia  >> supplement with Kphos 8/27   Best practice:  Diet:npo Pain/Anxiety/Delirium protocol (if indicated): diprovan/ fentanyl  VAP protocol (if indicated):  DVT prophylaxis: hep drip stopped 8/26 due to ? DIC GI prophylaxis: ppi changed to per FT 8/27  Glucose control:  Mobility: bed rest Code Status: full code  Family Communication: per [facility]  Disposition: ICU  Labs   CBC: Recent Labs  Lab 10/27/19 1438 10/27/19 1438 10/28/19 0331 10/28/19 0331 10/28/19 0941 10/29/19 0408 10/29/19 1157 10/29/19 1337 10/30/19 0354  WBC 13.8*   < > 8.3  --  11.8* 18.0* 16.4*  --  10.4  NEUTROABS 12.7*  --   --   --   --   --   --   --   --   HGB 10.3*   < > 10.8*  --  10.6* 9.7* 8.8*  --  7.8*  HCT 31.3*   < > 33.5*  --  32.6* 30.2* 27.4*  --  24.0*  MCV 83.5   < > 85.0  --  84.2 84.1 84.6  --  84.5  PLT 217   < > 142*   < > 116* 39* 37* 36* 38*   < > = values in this interval not displayed.    Basic Metabolic Panel: Recent Labs  Lab  10/27/19 1438 10/28/19 0331 10/28/19 0941 10/29/19 0408 10/30/19 0354 10/30/19 1139  NA 135 138 137 138 140  --   K 2.7* 4.0 4.1 3.9 3.4*  --   CL 106 112* 112* 113* 111  --   CO2 16* 15* 15* 17* 23  --   GLUCOSE 96 125* 121* 150* 124*  --   BUN 19 18 18  24* 11  --   CREATININE 0.89 0.66 0.65 0.71 0.50  --   CALCIUM 8.0* 7.7* 7.8* 7.7* 7.7*  --   MG 2.0 2.6*  --   --   --  1.9  PHOS  --  2.4*  --   --   --  1.2*   GFR: Estimated Creatinine Clearance: 93.2 mL/min (by C-G formula based on SCr of 0.5 mg/dL). Recent Labs  Lab 10/27/19 1438 10/28/19 0331 10/28/19 0941 10/29/19 0408 10/29/19 1157 10/29/19 1337 10/30/19 0354  PROCALCITON 2.08  --   --   --   --  0.81 4.68  WBC 13.8*   < > 11.8* 18.0* 16.4*  --  10.4  LATICACIDVEN 1.3  --   --   --   --   --   --    < > = values in this interval not displayed.    Liver Function Tests: Recent Labs  Lab 10/27/19 1438 10/28/19 0331 10/28/19 0941 10/29/19 0408 10/30/19 0354  AST 26 26 22  123* 32  ALT 12 16 15  59* 43  ALKPHOS 127* 162* 150* 130* 97  BILITOT 0.5 0.8 0.8 0.6 0.4  PROT 6.3* 6.2* 5.7* 5.3* 5.1*  ALBUMIN 3.0* 2.9* 2.7* 2.6* 2.6*  No results for input(s): LIPASE, AMYLASE in the last 168 hours. No results for input(s): AMMONIA in the last 168 hours.  ABG    Component Value Date/Time   PHART 7.365 10/29/2019 0340   PCO2ART 32.6 10/29/2019 0340   PO2ART 125 (H) 10/29/2019 0340   HCO3 19.5 (L) 10/29/2019 0340   ACIDBASEDEF 6.2 (H) 10/29/2019 0340   O2SAT 97.7 10/29/2019 0340     Coagulation Profile: Recent Labs  Lab 10/29/19 1337 10/30/19 0354  INR 1.5* 1.4*    Cardiac Enzymes: No results for input(s): CKTOTAL, CKMB, CKMBINDEX, TROPONINI in the last 168 hours.  HbA1C: No results found for: HGBA1C  CBG: Recent Labs  Lab 10/29/19 1704 10/29/19 2119 10/30/19 0139 10/30/19 0627 10/30/19 1110  GLUCAP 120* 146* 115* 118* 103*         The patient is critically ill with multiple organ  systems failure and requires high complexity decision making for assessment and support, frequent evaluation and titration of therapies, application of advanced monitoring technologies and extensive interpretation of multiple databases. Critical Care Time devoted to patient care services described in this note is 40 minutes.    Christinia Gully, MD Pulmonary and [place] [phone]   After 7:00 pm call Elink  [phone]

## 2019-10-30 NOTE — Progress Notes (Addendum)
Patient Demographics:    Yesly Gerety, is a 34 y.o. female, DOB - 1985-09-18, RWE:[id number]  Admit date - 10/27/2019   Admitting Physician Kobi Aller Denton Brick, MD  Outpatient Primary MD for the patient is Patient, No Pcp Per  LOS - 3   Chief Complaint  Patient presents with  . Shortness of Breath        Subjective:    [name]   -Becomes very restless and agitated with attempts at sedation vacation   no obvious bleeding noted -I called and left voicemail for patient's mother  Assessment  & Plan :    Principal Problem:   Acute respiratory failure (HCC) Active Problems:   Asthma   Multifocal pneumonia   History of seizures   PNA (pneumonia)   Thrombocytopenia (HCC)   DIC (disseminated intravascular coagulation) ([place])   Acute pulmonary embolism [facility])  Brief summary 34 year old Caucasian female with past medical history relevant for ongoing heroin abuse, 2 pack-a-day smoker with COPD/emphysema, history of underlying asthma, history of seizures with no recent seizure episodes and 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 Reather Converse, decision made to intubate on 10/28/19 -completed COVID-19 vaccination [employer]) in  April 2021 -COVID-19 test negative x 3 times  this admission  2) bilateral multifocal pneumonia---- intubated for hypoxic  respiratory failure as above #1 --Bronchodilators, -Strep pneumo negative, -Legionella neg PCT 2.08 >>0.81>>4.68---  ---stopped azithromycin and Rocephin on 10/29/2019 started on meropenem and doxycycline  3) acute COPD/emphysema exacerbation--- secondary to #2 above, contributing to #1 above --Patient is a 2 pack-a-day smoker -IV Solu-Medrol and bronchodilators as ordered  4) heroin addiction--- patient uses heroin on admission at least $50 worth of heroin on a daily basis, she does not inject apparently she snorts heroin -As needed lorazepam and continuous propofol drip as ordered -Currently on IV fentanyl drip  5)Bilateral nonocclusive pulmonary embolism--- patient received therapeutic dose of Lovenox on 10/27/2019 --Started IV heparin drip on 10/28/2019, switching to argatroban on 10/29/2019 due to worsening thrombocytopenia -Echo with EF of 55 to 60%, with severe right ventricular enlargement and moderate pulmonary hypertension -Lower extremity venous Dopplers negative  6)Social/Ethics---Spoke with Brother --- Bronson Ing at [phone] -- Brother unable to visit as he was dxed with covid PNA on 10/10/19 and he was just dced from hospital last week  Pt's brother stated that Arelys was not around him recently (not exposed to him while he had covid) --  Spoke with Mother---[name] [phone] Updated Both brother and mother----  7) history of seizures--no recent seizures, currently intubated and sedated with propofol along with as needed lorazepam  8) acute on chronic anemia and acute thrombocytopenia/DIC Versus TTP --- on admission on 10/27/2019 platelet count was 217, patient received Lovenox 80 mg subcu x1 on 10/27/2019 -Patient was started on IV heparin for PE on 10/28/2019 in a.m. Platelets  217>>142>>116>>39>>37>>38>>45 Hgb 10.8 >>> 8.8 >>7.8>>7.5 LDH 664>>594>>532 Fibrinogen  594>>79>>127 --Stopped IV heparin on 10/29/19, -- hematology consult from Dr. Delton Coombes appreciated -No  obvious bleeding at this time ----HIT and DIC panel results reviewed with Dr. Delton Coombes --DIC is more likely than HIT given the clinical presentation and lab findings -He advised stopping IV heparin, he advised against argatroban -- we gave FFP and cryoprecipitate 10/29/19 to reduce bleeding risk -Vitamin K given on 10/30/2019 --Since DIC, treatment of antibiotics we will stop azithromycin and Rocephin which was started on admission and instead use meropenem and doxycycline -Patient has multiple allergies limiting choices of antibiotics --Cannot exclude TTP, ADAMTs 13 panel pending (if << 10 ) be consistent with TTP --- I called [facility] hematology transfer line----[facility] declined to accept patient for transfer due to lack of beds  9)Prophylaxis--Protonix for GI prophylaxis and , holding off on SCDs due to PE and concerns about DVT propagation  10)FEN/hypokalemia and hypophosphatemia--replace potassium and phosphorous ---dietitian consult for tube feeding  CRITICAL CARE Performed by: Roxan Hockey   Total critical care time: 48 minutes  Critical care time was exclusive of separately billable procedures and treating other patients.  -acute hypoxic respiratory failure requiring intubation, sedation, IV antibiotics   Vent settings:- PRVC/40%/5/24/430 -Patient becomes very very restless and agitated with attempts at sedation vacation  Critical care was necessary to treat or prevent imminent or life-threatening deterioration.  Critical care was time spent personally by me on the following activities: development of treatment plan with patient and/or surrogate as well as nursing, discussions with consultants, evaluation of patient's response to treatment, examination of patient, obtaining history from patient or surrogate, ordering and performing treatments and interventions, ordering and review of laboratory studies, ordering and review of radiographic studies, pulse oximetry and  re-evaluation of patient's condition.   Disposition/Need for in-Hospital Stay- patient unable to be discharged at this time due to --- -acute hypoxic respiratory failure requiring intubation, sedation, IV antibiotics with plan for DIC and possible TTP  Status is: Inpatient  Remains inpatient appropriate because:acute hypoxic respiratory failure requiring intubation, sedation, IV antibiotics with plan for DIC and possible TTP   Disposition: The patient is from: Home              Anticipated d/c is to: TBD              Anticipated d/c date is: > 3 days              Patient currently is not medically stable to d/c. Barriers: Not Clinically Stable-  -acute hypoxic respiratory failure requiring intubation, sedation, IV antibiotics with plan for DIC and possible TTP  Code Status : Full  Family Communication:   -  D/w patient's mother [name]  -and pt Brother Chardae Mulkern at [phone]  Consults  :  PCCM/hematology  DVT Prophylaxis  : Bleeding risk high  Lab Results  Component Value Date   PLT 45 (L) 10/30/2019    Inpatient Medications  Scheduled Meds: . chlorhexidine gluconate (MEDLINE KIT)  15 mL Mouth Rinse BID  . Chlorhexidine Gluconate Cloth  6 each Topical Daily  . cloNIDine  0.1 mg Per Tube TID  . docusate  100 mg Oral BID  . feeding supplement (PROSource TF)  45 mL Per Tube TID  . feeding supplement (VITAL AF 1.2 CAL)  1,000 mL Per Tube Q24H  . folic acid  1 mg Oral Daily  . ipratropium-albuterol  3 mL Nebulization Q4H  .  mouth rinse  15 mL Mouth Rinse 10 times per day  . methylPREDNISolone (SOLU-MEDROL) injection  40 mg Intravenous Q12H  . multivitamin with minerals  1 tablet Oral Daily  . pantoprazole sodium  40 mg Per Tube Q1200  . rocuronium  50 mg Intravenous Once  . sodium chloride flush  3 mL Intravenous Q12H  . thiamine  100 mg Oral Daily   Or  . thiamine  100 mg Intravenous Daily   Continuous Infusions: . sodium chloride    . dextrose 5 % and  0.45% NaCl Stopped (10/30/19 1411)  . doxycycline (VIBRAMYCIN) IV 125 mL/hr at 10/30/19 1521  . fentaNYL infusion INTRAVENOUS Stopped (10/30/19 0756)  . meropenem (MERREM) IV Stopped (10/30/19 1138)  . phytonadione (VITAMIN K) IV    . potassium PHOSPHATE IVPB (in mmol) 85 mL/hr at 10/30/19 1521  . propofol (DIPRIVAN) infusion 50 mcg/kg/min (10/30/19 1521)   PRN Meds:.sodium chloride, acetaminophen **OR** acetaminophen, HYDROmorphone (DILAUDID) injection, LORazepam, ondansetron **OR** ondansetron (ZOFRAN) IV, sodium chloride flush    Anti-infectives (From admission, onward)   Start     Dose/Rate Route Frequency Ordered Stop   10/29/19 1700  meropenem (MERREM) 1 g in sodium chloride 0.9 % 100 mL IVPB        1 g 200 mL/hr over 30 Minutes Intravenous Every 8 hours 10/29/19 1557     10/29/19 1600  doxycycline (VIBRAMYCIN) 100 mg in sodium chloride 0.9 % 250 mL IVPB        100 mg 125 mL/hr over 120 Minutes Intravenous Every 12 hours 10/29/19 1536     10/28/19 1800  vancomycin (VANCOCIN) IVPB 1000 mg/200 mL premix  Status:  Discontinued        1,000 mg 200 mL/hr over 60 Minutes Intravenous Every 24 hours 10/27/19 1645 10/27/19 1956   10/28/19 1600  azithromycin (ZITHROMAX) 500 mg in sodium chloride 0.9 % 250 mL IVPB  Status:  Discontinued        500 mg 250 mL/hr over 60 Minutes Intravenous Every 24 hours 10/27/19 1956 10/29/19 1536   10/28/19 1600  cefTRIAXone (ROCEPHIN) 1 g in sodium chloride 0.9 % 100 mL IVPB  Status:  Discontinued        1 g 200 mL/hr over 30 Minutes Intravenous Every 24 hours 10/27/19 1956 10/29/19 1536   10/27/19 1645  cefTRIAXone (ROCEPHIN) 1 g in sodium chloride 0.9 % 100 mL IVPB        1 g 200 mL/hr over 30 Minutes Intravenous  Once 10/27/19 1630 10/27/19 1826   10/27/19 1645  azithromycin (ZITHROMAX) 500 mg in sodium chloride 0.9 % 250 mL IVPB        500 mg 250 mL/hr over 60 Minutes Intravenous  Once 10/27/19 1630 10/27/19 1957   10/27/19 1645  vancomycin  (VANCOREADY) IVPB 1250 mg/250 mL        1,250 mg 166.7 mL/hr over 90 Minutes Intravenous  Once 10/27/19 1638 10/27/19 2015        Objective:   Vitals:   10/30/19 1133 10/30/19 1200 10/30/19 1400 10/30/19 1500  BP:  121/67 (!) 102/56 (!) 105/52  Pulse:  (!) 106 88 92  Resp:  19 (!) 21 (!) 22  Temp:  98.5 F (36.9 C)    TempSrc:  Axillary    SpO2: 94% 95% 96% 96%  Weight:      Height:        Wt Readings from Last 3 Encounters:  10/30/19 66.9 kg  01/11/19 59 kg  01/09/15  65.8 kg     Intake/Output Summary (Last 24 hours) at 10/30/2019 1530 Last data filed at 10/30/2019 1521 Gross per 24 hour  Intake 4310.88 ml  Output 3150 ml  Net 1160.88 ml    Physical Exam  Gen:-Intubated and sedated  HEENT:-ET tube  Neck-No JVD,.  Lungs--few scattered rhonchi, air movement is symmetrical CV- S1, S2 normal, regular  Abd-  +ve B.Sounds, Abd Soft, ND Extremity/Skin:- No  edema, pedal pulses present NeuroPsych-Limited exam as patient is intubated and sedated and on both fentanyl and propofol drips GU --- Foley with clear urine   Data Review:   Micro Results Recent Results (from the past 240 hour(s))  Blood Culture (routine x 2)     Status: None (Preliminary result)   Collection Time: 10/27/19  1:50 PM   Specimen: BLOOD LEFT HAND  Result Value Ref Range Status   Specimen Description BLOOD LEFT HAND  Final   Special Requests   Final    BOTTLES DRAWN AEROBIC AND ANAEROBIC Blood Culture results may not be optimal due to an inadequate volume of blood received in culture bottles   Culture   Final    NO GROWTH 3 DAYS Performed at [facility], [address], [place], [place] [address]    Report Status PENDING  Incomplete  SARS Coronavirus 2 by RT PCR (hospital order, performed in [place] hospital lab) Nasopharyngeal Nasopharyngeal Swab     Status: None   Collection Time: 10/27/19  2:14 PM   Specimen: Nasopharyngeal Swab  Result Value Ref Range Status   SARS Coronavirus 2  NEGATIVE NEGATIVE Final    Comment: (NOTE) SARS-CoV-2 target nucleic acids are NOT DETECTED.  The SARS-CoV-2 RNA is generally detectable in upper and lower respiratory specimens during the acute phase of infection. The lowest concentration of SARS-CoV-2 viral copies this assay can detect is 250 copies / mL. A negative result does not preclude SARS-CoV-2 infection and should not be used as the sole basis for treatment or other patient management decisions.  A negative result may occur with improper specimen collection / handling, submission of specimen other than nasopharyngeal swab, presence of viral mutation(s) within the areas targeted by this assay, and inadequate number of viral copies (<250 copies / mL). A negative result must be combined with clinical observations, patient history, and epidemiological information.  Fact Sheet for Patients:   StrictlyIdeas.no  Fact Sheet for Healthcare Providers: BankingDealers.co.za  This test is not yet approved or  cleared by the [place] FDA and has been authorized for detection and/or diagnosis of SARS-CoV-2 by FDA under an Emergency Use Authorization (EUA).  This EUA will remain in effect (meaning this test can be used) for the duration of the COVID-19 declaration under Section 564(b)(1) of the Act, 21 U.S.C. section 360bbb-3(b)(1), unless the authorization is terminated or revoked sooner.  Performed at [facility], [address], [place], [place] [address]   Blood Culture (routine x 2)     Status: None (Preliminary result)   Collection Time: 10/27/19  2:39 PM   Specimen: BLOOD RIGHT HAND  Result Value Ref Range Status   Specimen Description BLOOD RIGHT HAND  Final   Special Requests   Final    BOTTLES DRAWN AEROBIC AND ANAEROBIC Blood Culture adequate volume   Culture   Final    NO GROWTH 3 DAYS Performed at [facility], [address] [place], [place] [address]    Report  Status PENDING  Incomplete  MRSA PCR Screening  Status: None   Collection Time: 10/27/19  9:30 PM   Specimen: Nasopharyngeal  Result Value Ref Range Status   MRSA by PCR NEGATIVE NEGATIVE Final    Comment:        The GeneXpert MRSA Assay (FDA approved for NASAL specimens only), is one component of a comprehensive MRSA colonization surveillance program. It is not intended to diagnose MRSA infection nor to guide or monitor treatment for MRSA infections. Performed at [facility], [address], [place], [place] [address]   SARS Coronavirus 2 by RT PCR (hospital order, performed in [facility] hospital lab) Nasopharyngeal     Status: None   Collection Time: 10/27/19  9:32 PM   Specimen: Nasopharyngeal  Result Value Ref Range Status   SARS Coronavirus 2 NEGATIVE NEGATIVE Final    Comment: (NOTE) SARS-CoV-2 target nucleic acids are NOT DETECTED.  The SARS-CoV-2 RNA is generally detectable in upper and lower respiratory specimens during the acute phase of infection. The lowest concentration of SARS-CoV-2 viral copies this assay can detect is 250 copies / mL. A negative result does not preclude SARS-CoV-2 infection and should not be used as the sole basis for treatment or other patient management decisions.  A negative result may occur with improper specimen collection / handling, submission of specimen other than nasopharyngeal swab, presence of viral mutation(s) within the areas targeted by this assay, and inadequate number of viral copies (<250 copies / mL). A negative result must be combined with clinical observations, patient history, and epidemiological information.  Fact Sheet for Patients:   StrictlyIdeas.no  Fact Sheet for Healthcare Providers: BankingDealers.co.za  This test is not yet approved or  cleared by the [place] FDA and has been authorized for detection and/or diagnosis of SARS-CoV-2 by FDA under an  Emergency Use Authorization (EUA).  This EUA will remain in effect (meaning this test can be used) for the duration of the COVID-19 declaration under Section 564(b)(1) of the Act, 21 U.S.C. section 360bbb-3(b)(1), unless the authorization is terminated or revoked sooner.  Performed at [facility], [address] [place], [place] [address]   Culture, respiratory (non-expectorated)     Status: None (Preliminary result)   Collection Time: 10/28/19  1:56 PM   Specimen: Tracheal Aspirate; Respiratory  Result Value Ref Range Status   Specimen Description   Final    TRACHEAL ASPIRATE Performed at [facility], [address] [place], [place] [address]    Special Requests   Final    Normal Performed at [address] [place], [place] [address]    Gram Stain   Final    MODERATE WBC PRESENT, PREDOMINANTLY PMN RARE SQUAMOUS EPITHELIAL CELLS PRESENT ABUNDANT YEAST FEW GRAM POSITIVE COCCI    Culture   Final    CULTURE REINCUBATED FOR BETTER GROWTH Performed at [facility], [place] [address], [place], [place] [address]    Report Status PENDING  Incomplete  SARS Coronavirus 2 by RT PCR (hospital order, performed in [facility] hospital lab) Nasopharyngeal Nasopharyngeal Swab     Status: None   Collection Time: 10/29/19  9:29 AM   Specimen: Nasopharyngeal Swab  Result Value Ref Range Status   SARS Coronavirus 2 NEGATIVE NEGATIVE Final    Comment: (NOTE) SARS-CoV-2 target nucleic acids are NOT DETECTED.  The SARS-CoV-2 RNA is generally detectable in upper and lower respiratory specimens during the acute phase of infection. The lowest concentration of SARS-CoV-2 viral copies this assay can detect is 250 copies / mL.  A negative result does not preclude SARS-CoV-2 infection and should not be used as the sole basis for treatment or other patient management decisions.  A negative result may occur with improper specimen collection / handling, submission of specimen  other than nasopharyngeal swab, presence of viral mutation(s) within the areas targeted by this assay, and inadequate number of viral copies (<250 copies / mL). A negative result must be combined with clinical observations, patient history, and epidemiological information.  Fact Sheet for Patients:   StrictlyIdeas.no  Fact Sheet for Healthcare Providers: BankingDealers.co.za  This test is not yet approved or  cleared by the [place] FDA and has been authorized for detection and/or diagnosis of SARS-CoV-2 by FDA under an Emergency Use Authorization (EUA).  This EUA will remain in effect (meaning this test can be used) for the duration of the COVID-19 declaration under Section 564(b)(1) of the Act, 21 U.S.C. section 360bbb-3(b)(1), unless the authorization is terminated or revoked sooner.  Performed at [facility], [address] [place], [place] [address]     Radiology Reports CT Angio Chest PE W and/or Wo Contrast  Result Date: 10/28/2019 CLINICAL DATA:  34 year old female with history of shortness of breath for the past 4 days. Fever. EXAM: CT ANGIOGRAPHY CHEST WITH CONTRAST TECHNIQUE: Multidetector CT imaging of the chest was performed using the standard protocol during bolus administration of intravenous contrast. Multiplanar CT image reconstructions and MIPs were obtained to evaluate the vascular anatomy. CONTRAST:  81m OMNIPAQUE IOHEXOL 350 MG/ML SOLN COMPARISON:  Chest CT 01/09/2015. FINDINGS: Cardiovascular: Tiny nonobstructive filling defects are noted within subsegmental pulmonary artery branches to the right lower lobe (axial image 194 of series 5) and the left upper lobe (axial image 155 of series 5). No other larger central, lobar or segmental sized filling defects are noted. Heart size is normal. There is no significant pericardial fluid, thickening or pericardial calcification. No atherosclerotic calcifications in the  thoracic aorta or coronary arteries. Mediastinum/Nodes: No pathologically enlarged mediastinal or hilar lymph nodes. Esophagus is unremarkable in appearance. No axillary lymphadenopathy. Lungs/Pleura: Widespread areas of severe ground-glass attenuation are noted scattered throughout the lungs bilaterally. No pleural effusions. Mild diffuse bronchial wall thickening with moderate centrilobular and paraseptal emphysema. Upper Abdomen: Unremarkable. Musculoskeletal: There are no aggressive appearing lytic or blastic lesions noted in the visualized portions of the skeleton. Review of the MIP images confirms the above findings. IMPRESSION: 1. Small nonocclusive pulmonary emboli in the lungs bilaterally, as above. 2. The appearance of the lungs is highly concerning for severe multilobar bilateral pneumonia, likely from atypical viral etiologies such as COVID infection. 3. Diffuse bronchial wall thickening with moderate centrilobular and paraseptal emphysema; imaging findings suggestive of underlying COPD. These results will be called to the ordering clinician or representative by the Radiologist Assistant, and communication documented in the PACS or [employer] Emphysema (ICD10-J43.9). Electronically Signed   By: DVinnie LangtonM.D.   On: 10/28/2019 09:37   [place]Abdomen Limited  Result Date: 10/29/2019 CLINICAL DATA:  Elevated LFTs EXAM: ULTRASOUND ABDOMEN LIMITED RIGHT UPPER QUADRANT COMPARISON:  January 11, 2019. FINDINGS: Gallbladder: Status post cholecystectomy. Common bile duct: Diameter: 2 mm, normal Liver: No focal lesion identified. Mildly heterogeneous liver echotexture. Portal vein is patent on color Doppler imaging with normal direction of blood flow towards the liver. Other: None. IMPRESSION: Mildly heterogeneous liver echotexture without focal lesion identified. Electronically Signed   By: SValentino SaxonMD   On: 10/29/2019 09:36   [place]Venous Img Lower Bilateral (DVT)  Result Date:  10/28/2019 CLINICAL DATA:  Acute respiratory failure. Small volume bilateral pulmonary embolism. Evaluate for DVT. EXAM: BILATERAL LOWER EXTREMITY VENOUS DOPPLER ULTRASOUND TECHNIQUE: Gray-scale sonography with graded compression, as well as color Doppler and duplex ultrasound were performed to evaluate the lower extremity deep venous systems from the level of the common femoral vein and including the common femoral, femoral, profunda femoral, popliteal and calf veins including the posterior tibial, peroneal and gastrocnemius veins when visible. The superficial great saphenous vein was also interrogated. Spectral Doppler was utilized to evaluate flow at rest and with distal augmentation maneuvers in the common femoral, femoral and popliteal veins. COMPARISON:  None. FINDINGS: RIGHT LOWER EXTREMITY Common Femoral Vein: No evidence of thrombus. Normal compressibility, respiratory phasicity and response to augmentation. Saphenofemoral Junction: No evidence of thrombus. Normal compressibility and flow on color Doppler imaging. Profunda Femoral Vein: No evidence of thrombus. Normal compressibility and flow on color Doppler imaging. Femoral Vein: No evidence of thrombus. Normal compressibility, respiratory phasicity and response to augmentation. Popliteal Vein: No evidence of thrombus. Normal compressibility, respiratory phasicity and response to augmentation. Calf Veins: No evidence of thrombus. Normal compressibility and flow on color Doppler imaging. Superficial Great Saphenous Vein: No evidence of thrombus. Normal compressibility. Venous Reflux:  None. Other Findings:  None. LEFT LOWER EXTREMITY Common Femoral Vein: No evidence of thrombus. Normal compressibility, respiratory phasicity and response to augmentation. Saphenofemoral Junction: No evidence of thrombus. Normal compressibility and flow on color Doppler imaging. Profunda Femoral Vein: No evidence of thrombus. Normal compressibility and flow on color Doppler  imaging. Femoral Vein: No evidence of thrombus. Normal compressibility, respiratory phasicity and response to augmentation. Popliteal Vein: No evidence of thrombus. Normal compressibility, respiratory phasicity and response to augmentation. Calf Veins: No evidence of thrombus. Normal compressibility and flow on color Doppler imaging. Superficial Great Saphenous Vein: No evidence of thrombus. Normal compressibility. Venous Reflux:  None. Other Findings:  None. IMPRESSION: No evidence of DVT within either lower extremity. Electronically Signed   By: Sandi Mariscal M.D.   On: 10/28/2019 16:50   DG CHEST PORT 1 VIEW  Result Date: 10/29/2019 CLINICAL DATA:  34 year old female with respiratory failure hypoxia. EXAM: PORTABLE CHEST 1 VIEW COMPARISON:  Chest radiograph dated 10/28/2019 and CT dated 10/28/2019. FINDINGS: Endotracheal tube above the carina and enteric tube extends below the diaphragm with tip the on the inferior margin of the image and side-port in the region of the stomach. Bilateral mid to lower lung field airspace opacities similar to prior radiograph. No large pleural effusion. No pneumothorax. Mild cardiomegaly. No acute osseous pathology. IMPRESSION: 1. No significant interval change in bilateral mid to lower lung field airspace opacities. 2. Stable support apparatus. Electronically Signed   By: Anner Crete M.D.   On: 10/29/2019 03:46   DG Chest Portable 1 View  Result Date: 10/28/2019 CLINICAL DATA:  Status post intubation today. EXAM: PORTABLE CHEST 1 VIEW COMPARISON:  Single-view of the chest 10/27/2019. FINDINGS: New NG tube is in place in good position with the side-port in the stomach. Endotracheal tube is also in good position with the tip just below the clavicular heads. Right worse than left airspace disease has progressed since yesterday's exam. No pneumothorax or pleural fluid. Heart size normal. IMPRESSION: ETT and NG tube in good position. Worsened airspace disease consistent with  progressive pneumonia. Electronically Signed   By: Inge Rise M.D.   On: 10/28/2019 11:42   DG Chest Port 1 View  Result Date: 10/27/2019 CLINICAL DATA:  Shortness of  breath. EXAM: PORTABLE CHEST 1 VIEW COMPARISON:  January 09, 2015. FINDINGS: The heart size and mediastinal contours are within normal limits. No pneumothorax or pleural effusion is noted. Multiple ill-defined airspace opacities are noted bilaterally concerning for multifocal pneumonia. The visualized skeletal structures are unremarkable. IMPRESSION: Bilateral multifocal pneumonia. Electronically Signed   By: Marijo Conception M.D.   On: 10/27/2019 14:27   ECHOCARDIOGRAM COMPLETE  Result Date: 10/28/2019    ECHOCARDIOGRAM REPORT   Patient Name:   MEEKAH MATH Date of Exam: 10/28/2019 Medical Rec #:  [id number]       Height:       64.0 in Accession #:    [id number]      Weight:       120.0 lb Date of Birth:  Apr 24, 1985        BSA:          1.575 m Patient Age:    73 years        BP:           130/94 mmHg Patient Gender: F               HR:           116 bpm. Exam Location:  [facility] Procedure: 2D Echo, Cardiac Doppler and Color Doppler Indications:    Pulmonary Embolus 415.19 / I26.99  History:        Patient has no prior history of Echocardiogram examinations.                 Multifocal pneumonia, Acute respiratory failure with hypoxia.  Sonographer:    Alvino Chapel RCS Referring Phys: [phone] Jakya Dovidio IMPRESSIONS  1. Left ventricular ejection fraction, by estimation, is 55 to 60%. The left ventricle has normal function. The left ventricle has no regional wall motion abnormalities. Left ventricular diastolic parameters are indeterminate.  2. Right ventricular systolic function is moderately reduced. The right ventricular size is severely enlarged. There is moderately elevated pulmonary artery systolic pressure.  3. The mitral valve is normal in structure. Mild mitral valve regurgitation. No evidence of mitral stenosis.  4. The  aortic valve has an indeterminant number of cusps. Aortic valve regurgitation is not visualized. No aortic stenosis is present. FINDINGS  Left Ventricle: Left ventricular ejection fraction, by estimation, is 55 to 60%. The left ventricle has normal function. The left ventricle has no regional wall motion abnormalities. The left ventricular internal cavity size was normal in size. There is  no left ventricular hypertrophy. Left ventricular diastolic parameters are indeterminate. Right Ventricle: There is septal flattening in systole. The right ventricular size is severely enlarged. No increase in right ventricular wall thickness. Right ventricular systolic function is moderately reduced. There is moderately elevated pulmonary artery systolic pressure. The tricuspid regurgitant velocity is 3.23 m/s, and with an assumed right atrial pressure of 10 mmHg, the estimated right ventricular systolic pressure is 13.0 mmHg. Left Atrium: Left atrial size was normal in size. Right Atrium: Right atrial size was normal in size. Pericardium: There is no evidence of pericardial effusion. Mitral Valve: The mitral valve is normal in structure. Mild mitral valve regurgitation. No evidence of mitral valve stenosis. Tricuspid Valve: The tricuspid valve is normal in structure. Tricuspid valve regurgitation is mild . No evidence of tricuspid stenosis. Aortic Valve: The aortic valve has an indeterminant number of cusps. Aortic valve regurgitation is not visualized. No aortic stenosis is present. Aortic valve mean gradient measures 2.7 mmHg. Aortic valve peak gradient measures  4.9 mmHg. Aortic valve area, by VTI measures 1.50 cm. Pulmonic Valve: The pulmonic valve was not well visualized. Pulmonic valve regurgitation is not visualized. No evidence of pulmonic stenosis. Aorta: The aortic root is normal in size and structure. Venous: IVC assessment for right atrial pressure unable to be performed due to mechanical ventilation. IAS/Shunts: No  atrial level shunt detected by color flow Doppler.  LEFT VENTRICLE PLAX 2D LVIDd:         3.94 cm  Diastology LVIDs:         2.98 cm  LV e' lateral:   19.90 cm/s LV PW:         0.80 cm  LV E/e' lateral: 4.6 LV IVS:        0.83 cm  LV e' medial:    14.40 cm/s LVOT diam:     1.70 cm  LV E/e' medial:  6.3 LV SV:         27 LV SV Index:   17 LVOT Area:     2.27 cm  RIGHT VENTRICLE RV S prime:     15.70 cm/s TAPSE (M-mode): 2.2 cm LEFT ATRIUM           Index       RIGHT ATRIUM           Index LA diam:      2.20 cm 1.40 cm/m  RA Area:     17.40 cm LA Vol (A4C): 34.9 ml 22.16 ml/m RA Volume:   48.50 ml  30.80 ml/m  AORTIC VALVE AV Area (Vmax):    1.78 cm AV Area (Vmean):   1.50 cm AV Area (VTI):     1.50 cm AV Vmax:           110.51 cm/s AV Vmean:          78.791 cm/s AV VTI:            0.178 m AV Peak Grad:      4.9 mmHg AV Mean Grad:      2.7 mmHg LVOT Vmax:         86.50 cm/s LVOT Vmean:        52.200 cm/s LVOT VTI:          0.117 m LVOT/AV VTI ratio: 0.66  AORTA Ao Root diam: 3.10 cm MITRAL VALVE               TRICUSPID VALVE MV Area (PHT): 6.96 cm    TR Peak grad:   41.7 mmHg MV Decel Time: 109 msec    TR Vmax:        323.00 cm/s MV E velocity: 90.90 cm/s                            SHUNTS                            Systemic VTI:  0.12 m                            Systemic Diam: 1.70 cm Carlyle Dolly MD Electronically signed by Carlyle Dolly MD Signature Date/Time: 10/28/2019/3:31:55 PM    Final      CBC Recent Labs  Lab 10/27/19 1438 10/28/19 0331 10/28/19 0941 10/28/19 0941 10/29/19 0408 10/29/19 1157 10/29/19 1337 10/30/19 0354 10/30/19 1331  WBC 13.8*   < > 11.8*  --  18.0* 16.4*  --  10.4 10.1  HGB 10.3*   < > 10.6*  --  9.7* 8.8*  --  7.8* 7.5*  HCT 31.3*   < > 32.6*  --  30.2* 27.4*  --  24.0* 24.1*  PLT 217   < > 116*   < > 39* 37* 36* 38* 45*  MCV 83.5   < > 84.2  --  84.1 84.6  --  84.5 87.0  MCH 27.5   < > 27.4  --  27.0 27.2  --  27.5 27.1  MCHC 32.9   < > 32.5  --  32.1  32.1  --  32.5 31.1  RDW 19.7*   < > 20.0*  --  21.6* 21.1*  --  20.4* 20.3*  LYMPHSABS 0.7  --   --   --   --   --   --   --   --   MONOABS 0.3  --   --   --   --   --   --   --   --   EOSABS 0.1  --   --   --   --   --   --   --   --   BASOSABS 0.1  --   --   --   --   --   --   --   --    < > = values in this interval not displayed.    Chemistries  Recent Labs  Lab 10/27/19 1438 10/27/19 1438 10/28/19 0331 10/28/19 0941 10/29/19 0408 10/30/19 0354 10/30/19 1139 10/30/19 1331  NA 135   < > 138 137 138 140  --  141  K 2.7*   < > 4.0 4.1 3.9 3.4*  --  3.1*  CL 106   < > 112* 112* 113* 111  --  112*  CO2 16*   < > 15* 15* 17* 23  --  23  GLUCOSE 96   < > 125* 121* 150* 124*  --  101*  BUN 19   < > 18 18 24* 11  --  9  CREATININE 0.89   < > 0.66 0.65 0.71 0.50  --  0.53  CALCIUM 8.0*   < > 7.7* 7.8* 7.7* 7.7*  --  7.5*  MG 2.0  --  2.6*  --   --   --  1.9  --   AST 26   < > 26 22 123* 32  --  34  ALT 12   < > 16 15 59* 43  --  43  ALKPHOS 127*   < > 162* 150* 130* 97  --  92  BILITOT 0.5   < > 0.8 0.8 0.6 0.4  --  0.6   < > = values in this interval not displayed.   ------------------------------------------------------------------------------------------------------------------ Recent Labs    10/29/19 0408 10/30/19 0354  TRIG 213* 136    No results found for: HGBA1C ------------------------------------------------------------------------------------------------------------------ No results for input(s): TSH, T4TOTAL, T3FREE, THYROIDAB in the last 72 hours.  Invalid input(s): FREET3 ------------------------------------------------------------------------------------------------------------------ Recent Labs    10/29/19 1905  RETICCTPCT 1.5    Coagulation profile Recent Labs  Lab 10/29/19 1337 10/30/19 0354 10/30/19 1331  INR 1.5* 1.4* 1.4*    Recent Labs    10/29/19 1337  DDIMER >20.00*    Cardiac Enzymes No results for input(s): CKMB, TROPONINI,  MYOGLOBIN in the last 168 hours.  Invalid input(s): CK ------------------------------------------------------------------------------------------------------------------  Component Value Date/Time   BNP 271.0 (H) 10/27/2019 1438   Roxan Hockey M.D on 10/30/2019 at 3:30 PM  Go to [email or web address] - for contact info  [facility] - Office  [phone]

## 2019-10-30 NOTE — Progress Notes (Signed)
Nutrition Follow up  DOCUMENTATION CODES:   Not applicable  INTERVENTION:  -Vital 1.2 @ 40 ml/hr with 45 ml ProSource TF 3x daily via OG tube  -MVI with minerals daily via tube  This regimen at goal rate 40 ml/hr will provide 1272 kcal (1703 total kcal with propofol at current rate; meets 106% of estimated kcal needs), 105 grams of protein, and 778 ml free water  Free water per MD discretion  NUTRITION DIAGNOSIS:   Inadequate oral intake related to inability to eat as evidenced by NPO status.  GOAL:   Provide needs based on ASPEN/SCCM guidelines   MONITOR:   Labs, I & O's, Vent status, Weight trends, Diet advancement, Skin  REASON FOR ASSESSMENT:   Consult Enteral/tube feeding initiation and management  ASSESSMENT:   34 year old female with history significant for asthma, seizures, ongoing heroin abuse presented with 4 days of worsening SOB and subjective fevers with chills admitted for acute respiratory failure with hypoxia.Severe multilobar bilateral pneumonia. Oncology following- severe thrombocytopenia.   Patient is currently intubated on ventilator support.  MV: 10.9  L/min Temp (24hrs), Avg:98.5 F (36.9 C), Min:98.3 F (36.8 C), Max:98.9 F (37.2 C)  Propofol: 16.32 ml/hr [providing 431 kcal lipids q 24 hr at current rate]  MAP-79   Labs: BMP Latest Ref Rng & Units 10/30/2019 10/29/2019 10/28/2019  Glucose 70 - 99 mg/dL 568(L) 275(T) 700(F)  BUN 6 - 20 mg/dL 11 74(B) 18  Creatinine 0.44 - 1.00 mg/dL 4.49 6.75 9.16  Sodium 135 - 145 mmol/L 140 138 137  Potassium 3.5 - 5.1 mmol/L 3.4(L) 3.9 4.1  Chloride 98 - 111 mmol/L 111 113(H) 112(H)  CO2 22 - 32 mmol/L 23 17(L) 15(L)  Calcium 8.9 - 10.3 mg/dL 7.7(L) 7.7(L) 7.8(L)    Medications reviewed and include: Protonix, Colace, Folvite, Solumedrol. IV-D5/0.45 NS @ 150 ml/hr    Intake/Output Summary (Last 24 hours) at 10/30/2019 1133 Last data filed at 10/30/2019 1110 Gross per 24 hour  Intake 4592.78 ml   Output 2700 ml  Net 1892.78 ml  Admit wt. 54.4 kg reported by pt?? Per chart review usual range 61-66 kg   Diet Order:   Diet Order            Diet NPO time specified  Diet effective now                 EDUCATION NEEDS:   No education needs have been identified at this time  Skin:  Skin Assessment: Reviewed RN Assessment  Last BM:  8/27 type 7  Height:   Ht Readings from Last 1 Encounters:  10/27/19 5\' 4"  (1.626 m)    Weight:   Wt Readings from Last 1 Encounters:  10/30/19 66.9 kg    Ideal Body Weight:  54.5 kg  BMI:  Body mass index is 25.32 kg/m.  Estimated Nutritional Needs:   Kcal:  1609  Protein:  82-93  Fluid:  >/= 1.6 L/day  11/01/19 MS,RD,CSG,LDN Pager: #[phone]

## 2019-10-31 ENCOUNTER — Inpatient Hospital Stay (HOSPITAL_COMMUNITY): Payer: Medicaid Other

## 2019-10-31 LAB — BLOOD GAS, ARTERIAL
Acid-Base Excess: 3.5 mmol/L — ABNORMAL HIGH (ref 0.0–2.0)
Bicarbonate: 27.7 mmol/L (ref 20.0–28.0)
FIO2: 45
MECHVT: 540 mL
O2 Saturation: 98.6 %
PEEP: 5 cmH2O
Patient temperature: 37
RATE: 24 resp/min
pCO2 arterial: 37.5 mmHg (ref 32.0–48.0)
pH, Arterial: 7.471 — ABNORMAL HIGH (ref 7.350–7.450)
pO2, Arterial: 142 mmHg — ABNORMAL HIGH (ref 83.0–108.0)

## 2019-10-31 LAB — CBC
HCT: 25.3 % — ABNORMAL LOW (ref 36.0–46.0)
Hemoglobin: 7.8 g/dL — ABNORMAL LOW (ref 12.0–15.0)
MCH: 27.1 pg (ref 26.0–34.0)
MCHC: 30.8 g/dL (ref 30.0–36.0)
MCV: 87.8 fL (ref 80.0–100.0)
Platelets: 62 10*3/uL — ABNORMAL LOW (ref 150–400)
RBC: 2.88 MIL/uL — ABNORMAL LOW (ref 3.87–5.11)
RDW: 19.9 % — ABNORMAL HIGH (ref 11.5–15.5)
WBC: 7.7 10*3/uL (ref 4.0–10.5)
nRBC: 0.9 % — ABNORMAL HIGH (ref 0.0–0.2)

## 2019-10-31 LAB — CULTURE, RESPIRATORY W GRAM STAIN
Culture: NORMAL
Special Requests: NORMAL

## 2019-10-31 LAB — HEPARIN LEVEL (UNFRACTIONATED): Heparin Unfractionated: <0.1 IU/mL — ABNORMAL LOW (ref 0.30–0.70)

## 2019-10-31 LAB — GLUCOSE, CAPILLARY
Glucose-Capillary: 152 mg/dL — ABNORMAL HIGH (ref 70–99)
Glucose-Capillary: 109 mg/dL — ABNORMAL HIGH (ref 70–99)
Glucose-Capillary: 128 mg/dL — ABNORMAL HIGH (ref 70–99)
Glucose-Capillary: 120 mg/dL — ABNORMAL HIGH (ref 70–99)
Glucose-Capillary: 104 mg/dL — ABNORMAL HIGH (ref 70–99)
Glucose-Capillary: 114 mg/dL — ABNORMAL HIGH (ref 70–99)

## 2019-10-31 LAB — BASIC METABOLIC PANEL
Anion gap: 8 (ref 5–15)
BUN: 10 mg/dL (ref 6–20)
CO2: 24 mmol/L (ref 22–32)
Calcium: 7.6 mg/dL — ABNORMAL LOW (ref 8.9–10.3)
Chloride: 109 mmol/L (ref 98–111)
Creatinine, Ser: 0.42 mg/dL — ABNORMAL LOW (ref 0.44–1.00)
GFR calc Af Amer: >60 mL/min (ref >60)
GFR calc non Af Amer: >60 mL/min (ref >60)
Glucose, Bld: 110 mg/dL — ABNORMAL HIGH (ref 70–99)
Potassium: 3.8 mmol/L (ref 3.5–5.1)
Sodium: 141 mmol/L (ref 135–145)

## 2019-10-31 LAB — PROCALCITONIN: Procalcitonin: 0.21 ng/mL

## 2019-10-31 LAB — PHOSPHORUS
Phosphorus: 2.7 mg/dL (ref 2.5–4.6)
Phosphorus: 2.5 mg/dL (ref 2.5–4.6)

## 2019-10-31 LAB — PROTIME-INR
INR: 1.3 — ABNORMAL HIGH (ref 0.8–1.2)
Prothrombin Time: 15.4 seconds — ABNORMAL HIGH (ref 11.4–15.2)

## 2019-10-31 LAB — LACTATE DEHYDROGENASE: LDH: 523 U/L — ABNORMAL HIGH (ref 98–192)

## 2019-10-31 LAB — FIBRINOGEN: Fibrinogen: 175 mg/dL — ABNORMAL LOW (ref 210–475)

## 2019-10-31 LAB — TRIGLYCERIDES: Triglycerides: 110 mg/dL (ref <150)

## 2019-10-31 MED ORDER — SODIUM CHLORIDE 0.9 % IV SOLN
0.5000 mg/h | INTRAVENOUS | Status: DC
  Administered 2019-10-31: 1 mg/h via INTRAVENOUS
  Administered 2019-11-01 – 2019-11-03 (×2): 2 mg/h via INTRAVENOUS
  Filled 2019-10-31 (×4): qty 5

## 2019-10-31 MED ORDER — PROPOFOL 1000 MG/100ML IV EMUL
5.0000 ug/kg/min | INTRAVENOUS | Status: DC
  Administered 2019-10-31: 55 ug/kg/min via INTRAVENOUS
  Administered 2019-10-31: 80 ug/kg/min via INTRAVENOUS
  Administered 2019-10-31 (×2): 75 ug/kg/min via INTRAVENOUS
  Administered 2019-10-31: 80 ug/kg/min via INTRAVENOUS
  Administered 2019-11-01: 65 ug/kg/min via INTRAVENOUS
  Administered 2019-11-01 (×2): 55 ug/kg/min via INTRAVENOUS
  Administered 2019-11-01: 20 ug/kg/min via INTRAVENOUS
  Administered 2019-11-01: 65 ug/kg/min via INTRAVENOUS
  Administered 2019-11-01: 50 ug/kg/min via INTRAVENOUS
  Administered 2019-11-02 (×2): 40 ug/kg/min via INTRAVENOUS
  Filled 2019-10-31 (×2): qty 100
  Filled 2019-10-31: qty 200
  Filled 2019-10-31 (×9): qty 100

## 2019-10-31 MED ORDER — METHYLPREDNISOLONE SODIUM SUCC 40 MG IJ SOLR
40.0000 mg | Freq: Every day | INTRAMUSCULAR | Status: DC
  Administered 2019-11-01 – 2019-11-03 (×3): 40 mg via INTRAVENOUS
  Filled 2019-10-31 (×3): qty 1

## 2019-10-31 MED ORDER — HEPARIN (PORCINE) 25000 UT/250ML-% IV SOLN
1750.0000 [IU]/h | INTRAVENOUS | Status: DC
  Administered 2019-10-31: 1150 [IU]/h via INTRAVENOUS
  Administered 2019-11-01 (×2): 1500 [IU]/h via INTRAVENOUS
  Administered 2019-11-02 – 2019-11-03 (×3): 1650 [IU]/h via INTRAVENOUS
  Filled 2019-10-31 (×6): qty 250

## 2019-10-31 MED ORDER — HALOPERIDOL LACTATE 5 MG/ML IJ SOLN
5.0000 mg | Freq: Four times a day (QID) | INTRAMUSCULAR | Status: AC
  Administered 2019-10-31 – 2019-11-01 (×6): 5 mg via INTRAMUSCULAR
  Filled 2019-10-31 (×6): qty 1

## 2019-10-31 NOTE — Progress Notes (Signed)
ANTICOAGULATION CONSULT NOTE   Pharmacy Consult for heparin  Indication: pulmonary embolus  Allergies  Allergen Reactions  . Bactrim [Sulfamethoxazole-Trimethoprim] Nausea And Vomiting  . Ciprofloxacin Nausea And Vomiting  . Darvocet [Propoxyphene N-Acetaminophen] Hives  . Other     darvocet  . Penicillins Itching  . Penicillins Hives  . Bactrim [Sulfamethoxazole-Trimethoprim] Rash  . Ciprofloxacin Rash    Patient Measurements: Height: 5\' 8"  (172.7 cm) Weight: 70.4 kg (155 lb 3.3 oz) IBW/kg (Calculated) : 63.9 Heparin Dosing Weight: HEPARIN DW (KG): 54.4   Vital Signs: Temp: 98.3 F (36.8 C) (08/28 1612) Temp Source: Axillary (08/28 1612) BP: 126/65 (08/28 1545) Pulse Rate: 79 (08/28 1612)  Labs: Recent Labs    10/29/19 0408 10/29/19 0408 10/29/19 1157 10/29/19 1157 10/29/19 1337 10/29/19 1337 10/30/19 0354 10/30/19 0354 10/30/19 1331 10/31/19 0419 10/31/19 1644  HGB 9.7*   < > 8.8*  --   --    < > 7.8*   < > 7.5* 7.8*  --   HCT 30.2*   < > 27.4*  --   --    < > 24.0*  --  24.1* 25.3*  --   PLT 39*   < > 37*   < > 36*   < > 38*  --  45* 62*  --   APTT  --   --   --   --  52*  --  28  --   --   --   --   LABPROT  --   --   --   --  17.6*   < > 16.4*  --  16.8* 15.4*  --   INR  --   --   --   --  1.5*   < > 1.4*  --  1.4* 1.3*  --   HEPARINUNFRC <0.10*  --  0.11*  --   --   --   --   --   --   --  <0.10*  CREATININE 0.71   < >  --   --   --   --  0.50  --  0.53 0.42*  --    < > = values in this interval not displayed.    Estimated Creatinine Clearance: 100 mL/min (A) (by C-G formula based on SCr of 0.42 mg/dL (L)).  Assessment: Diane Norton a 34 y.o. female requires anticoagulation with a heparin iv infusion for the indication of  pulmonary embolus. Heparin gtt will be started following pharmacy protocol per pharmacy consult.   Improving platelets, fibrinogen, and DIC- restart heparin per Dr. 20.  Heparin level <0.10- no bolus per MD   Goal  of Therapy:  Heparin level 0.3-0.7 units/ml Monitor platelets by anticoagulation protocol: Yes   Plan:  Increase heparin infusion to 1350 units/hr. F/U 6 hour heparin level Continue to monitor H&H and s/s of bleeding.  [name], PharmD Clinical Pharmacist 10/31/2019 5:22 PM

## 2019-10-31 NOTE — Progress Notes (Signed)
ANTICOAGULATION CONSULT NOTE   Pharmacy Consult for heparin  Indication: pulmonary embolus  Allergies  Allergen Reactions  . Bactrim [Sulfamethoxazole-Trimethoprim] Nausea And Vomiting  . Ciprofloxacin Nausea And Vomiting  . Darvocet [Propoxyphene N-Acetaminophen] Hives  . Heparin Other (See Comments)    HIT  . Other     darvocet  . Penicillins Itching  . Penicillins Hives  . Bactrim [Sulfamethoxazole-Trimethoprim] Rash  . Ciprofloxacin Rash    Patient Measurements: Height: 5\' 8"  (172.7 cm) Weight: 70.4 kg (155 lb 3.3 oz) IBW/kg (Calculated) : 63.9 Heparin Dosing Weight: HEPARIN DW (KG): 54.4   Vital Signs: Temp: 97.8 F (36.6 C) (08/28 0727) Temp Source: Axillary (08/28 0727) BP: 128/66 (08/28 0800) Pulse Rate: 74 (08/28 0800)  Labs: Recent Labs    10/29/19 0408 10/29/19 0408 10/29/19 1157 10/29/19 1157 10/29/19 1337 10/29/19 1337 10/30/19 0354 10/30/19 0354 10/30/19 1331 10/31/19 0419  HGB 9.7*   < > 8.8*  --   --    < > 7.8*   < > 7.5* 7.8*  HCT 30.2*   < > 27.4*  --   --    < > 24.0*  --  24.1* 25.3*  PLT 39*   < > 37*   < > 36*   < > 38*  --  45* 62*  APTT  --   --   --   --  52*  --  28  --   --   --   LABPROT  --   --   --   --  17.6*   < > 16.4*  --  16.8* 15.4*  INR  --   --   --   --  1.5*   < > 1.4*  --  1.4* 1.3*  HEPARINUNFRC <0.10*  --  0.11*  --   --   --   --   --   --   --   CREATININE 0.71   < >  --   --   --   --  0.50  --  0.53 0.42*   < > = values in this interval not displayed.    Estimated Creatinine Clearance: 100 mL/min (A) (by C-G formula based on SCr of 0.42 mg/dL (L)).  Assessment: Diane Norton a 34 y.o. female requires anticoagulation with a heparin iv infusion for the indication of  pulmonary embolus. Heparin gtt will be started following pharmacy protocol per pharmacy consult.   Improving platelets, fibrinogen, and DIC- restart heparin per Dr. 20.   Goal of Therapy:  Heparin level 0.3-0.7 units/ml Monitor  platelets by anticoagulation protocol: Yes   Plan:  Start heparin infusion at 1150 units/hr. F/u 6hr heparin level Continue to monitor H&H and s/s of bleeding.  Diane Norton, PharmD Clinical Pharmacist 10/31/2019 9:43 AM

## 2019-10-31 NOTE — Progress Notes (Addendum)
Have increased VT to 540 as patient had numerous  times during night she  started breathing over vent pulling greater VT than 430, up to almost 600 this  occurred while patient is sedated. Respirations up to 30's with air stacking occurring. Also re measured patient and she appears closer to 5'8".   Peak pressure is 23 , plateau 18 no sign of auto peep. Patient still wheezing and has pink secretions when suctioned. 540 is a little higher than 8 cc

## 2019-10-31 NOTE — Progress Notes (Signed)
  Discussed with Dr. [name]--- given improving fibrinogen, platelet count and DIC  profile with restart IV heparin for PE without bolus -Pharmacist notified  Shon Hale, MD

## 2019-10-31 NOTE — Progress Notes (Signed)
Patient Demographics:    Diane Norton, is a 34 y.o. female, DOB - 03-31-1985, BPZ:[id number]  Admit date - 10/27/2019   Admitting Physician Nuha Degner Denton Brick, MD  Outpatient Primary MD for the patient is Patient, No Pcp Per  LOS - 4  Chief Complaint  Patient presents with  . Shortness of Breath        Subjective:    Devora Norton is afebrile, -Breathing over the vent, -Biting on ET tube despite being on 50 of propofol and 200 mics of fentanyl -Urine output is adequate -  Called and updated Mother---Ms Diane Norton [phone] on 10/31/19   Assessment  & Plan :    Principal Problem:   Acute Hypoxic respiratory failure ([place]) Active Problems:   DIC (disseminated intravascular coagulation) ([place])   Acute pulmonary embolism (HCC)   Asthma   Multifocal pneumonia   History of seizures   PNA (pneumonia)   Thrombocytopenia ([place])  Brief summary 34 year old Caucasian female with past medical history relevant for ongoing heroin abuse, 2 pack-a-day smoker with COPD/emphysema, history of underlying asthma, history of seizures with no recent seizure episodes and 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 Reather Converse, decision made to intubate on 10/28/19 -completed COVID-19 vaccination ([place]) in  April 2021 -COVID-19 test negative x 3 times  this  admission -Repeat chest x-ray ordered for 10/31/2019 -Ventilator settings adjusted overnight, specifically tidal volume increased to 540  2) bilateral multifocal pneumonia---- intubated for hypoxic respiratory failure as above #1 --Bronchodilators, -Strep pneumo negative, -Legionella neg PCT 2.08 >>0.81>>4.68--- >>0.21 ---stopped azithromycin and Rocephin on 10/29/2019 started on meropenem and doxycycline on 10/29/19  3) acute COPD/emphysema exacerbation--- secondary to #2 above, contributing to #1 above --Patient is a 2 pack-a-day smoker -Occasional wheezing persist -Decrease IV Solu-Medrol to 40 mg daily and bronchodilators as ordered  4)Heroin addiction--- PTA patient used heroin daily at least $50 worth of heroin on a daily basis, she does not inject apparently she snorts heroin -As needed lorazepam and continuous propofol drip as ordered -Currently on IV fentanyl drip at 200 mics --Biting on ET tube despite being on 50 of propofol and 200 mics of fentanyl -We will titrate IV propofol drip up to achieve better sedation  5)Bilateral nonocclusive pulmonary embolism--- patient received therapeutic dose of Lovenox on 10/27/2019 --Started IV heparin drip on 10/28/2019, switching to argatroban on 10/29/2019 due to worsening thrombocytopenia -Echo with EF of 55 to 60%, with severe right ventricular enlargement and moderate pulmonary hypertension -Lower extremity venous Dopplers negative  6)Social/Ethics---Spoke with Brother --- Bronson Ing at [phone] -- Brother unable to visit as he was dxed with covid PNA on 10/10/19 and he was just dced from hospital last week  Pt's brother stated that Sennie was not around him recently (not exposed to him while he had covid) --  Spoke with Mother---Ms Diane Norton [phone]  Updated Both brother and mother----  54) history of seizures--no recent seizures, currently intubated and sedated with propofol along with as needed lorazepam  8) acute on chronic anemia  and acute thrombocytopenia/DIC Versus TTP --- on admission on 10/27/2019 platelet count was 217, patient received Lovenox 80 mg subcu x1 on 10/27/2019 -Patient was started on IV heparin for PE on 10/28/2019 in a.m. Platelets 217>>142>>116>>39>>37>>38>>45>>62 Hgb 10.8 >>> 8.8 >>7.8>>7.5>>7.8 LDH 664>>594>>532>>523 Fibrinogen  594>>79>>127>>175 --Stopped IV heparin on 10/29/19, -- hematology consult from Dr. Delton Coombes appreciated -No obvious bleeding at this time ----HIT and DIC panel results reviewed with Dr. Delton Coombes --DIC is more likely than HIT given the clinical presentation and lab findings -He advised stopping IV heparin, he advised against argatroban -- we gave FFP and cryoprecipitate 10/29/19 to reduce bleeding risk -Vitamin K given on 10/30/2019 --Since DIC, treatment of antibiotics we will stop azithromycin and Rocephin which was started on admission and instead use meropenem and doxycycline -Patient has multiple allergies limiting choices of antibiotics --Cannot exclude TTP, ADAMTS 13 panel pending (if << 10 ) be consistent with TTP --- I called [facility] hematology transfer line----[facility] declined to accept patient for transfer due to lack of beds  9)Prophylaxis--Protonix for GI prophylaxis and , holding off on SCDs due to PE and concerns about DVT propagation  10)FEN/hypokalemia and hypophosphatemia--replace potassium and phosphorous ---dietitian consult for tube feeding, c/n Vital 1.2 tube feeding via OG tube  CRITICAL CARE Performed by: Roxan Hockey   Total critical care time: 45 minutes  Critical care time was exclusive of separately billable procedures and treating other patients.  -acute hypoxic respiratory failure requiring intubation, sedation, IV antibiotics   Vent settings:- PRVC/40%/5/24/540  --Increase tidal volume to 540 from 430, Biting on ET tube despite being on 50 of propofol and 200 mics of fentanyl -We will titrate IV propofol drip up to  achieve better sedation  Critical care was necessary to treat or prevent imminent or life-threatening deterioration.  Critical care was time spent personally by me on the following activities: development of treatment plan with patient and/or surrogate as well as nursing, discussions with consultants, evaluation of patient's response to treatment, examination of patient, obtaining history from patient or surrogate, ordering and performing treatments and interventions, ordering and review of laboratory studies, ordering and review of radiographic studies, pulse oximetry and re-evaluation of patient's condition.   Disposition/Need for in-Hospital Stay- patient unable to be discharged at this time due to --- -acute hypoxic respiratory failure requiring intubation, sedation, IV antibiotics with plan for DIC and possible TTP  Status is: Inpatient  Remains inpatient appropriate because:acute hypoxic respiratory failure requiring intubation, sedation, IV antibiotics with plan for DIC and possible TTP   Disposition: The patient is from: Home              Anticipated d/c is to: TBD              Anticipated d/c date is: > 3 days              Patient currently is not medically stable to d/c. Barriers: Not Clinically Stable-  -acute hypoxic respiratory failure requiring intubation, sedation, IV antibiotics with plan for DIC and possible TTP  Code Status : Full  Family Communication:   -  D/w patient's mother [name]  -and pt Brother Armetta Henri at [phone]  Consults  :  PCCM/hematology  DVT Prophylaxis  : Bleeding risk high  Lab Results  Component Value Date  PLT 62 (L) 10/31/2019    Inpatient Medications  Scheduled Meds: . chlorhexidine gluconate (MEDLINE KIT)  15 mL Mouth Rinse BID  . Chlorhexidine Gluconate Cloth  6 each Topical Daily  . cloNIDine  0.1 mg Per Tube TID  . docusate  100 mg Per Tube BID  . feeding supplement (PROSource TF)  45 mL Per Tube TID  . feeding  supplement (VITAL AF 1.2 CAL)  1,000 mL Per Tube Q24H  . folic acid  1 mg Oral Daily  . ipratropium-albuterol  3 mL Nebulization Q4H  . mouth rinse  15 mL Mouth Rinse 10 times per day  . [START ON 11/01/2019] methylPREDNISolone (SOLU-MEDROL) injection  40 mg Intravenous Daily  . multivitamin with minerals  1 tablet Oral Daily  . pantoprazole sodium  40 mg Per Tube Q1200  . rocuronium  50 mg Intravenous Once  . sodium chloride flush  3 mL Intravenous Q12H  . thiamine  100 mg Oral Daily   Or  . thiamine  100 mg Intravenous Daily   Continuous Infusions: . sodium chloride    . dextrose 5 % and 0.45% NaCl 150 mL/hr at 10/31/19 0916  . doxycycline (VIBRAMYCIN) IV Stopped (10/31/19 0533)  . fentaNYL infusion INTRAVENOUS 200 mcg/hr (10/31/19 0458)  . meropenem (MERREM) IV 1 g (10/31/19 0806)  . propofol (DIPRIVAN) infusion 55 mcg/kg/min (10/31/19 0919)   PRN Meds:.sodium chloride, acetaminophen **OR** acetaminophen, HYDROmorphone (DILAUDID) injection, LORazepam, ondansetron **OR** ondansetron (ZOFRAN) IV, sodium chloride flush    Anti-infectives (From admission, onward)   Start     Dose/Rate Route Frequency Ordered Stop   10/29/19 1700  meropenem (MERREM) 1 g in sodium chloride 0.9 % 100 mL IVPB        1 g 200 mL/hr over 30 Minutes Intravenous Every 8 hours 10/29/19 1557     10/29/19 1600  doxycycline (VIBRAMYCIN) 100 mg in sodium chloride 0.9 % 250 mL IVPB        100 mg 125 mL/hr over 120 Minutes Intravenous Every 12 hours 10/29/19 1536     10/28/19 1800  vancomycin (VANCOCIN) IVPB 1000 mg/200 mL premix  Status:  Discontinued        1,000 mg 200 mL/hr over 60 Minutes Intravenous Every 24 hours 10/27/19 1645 10/27/19 1956   10/28/19 1600  azithromycin (ZITHROMAX) 500 mg in sodium chloride 0.9 % 250 mL IVPB  Status:  Discontinued        500 mg 250 mL/hr over 60 Minutes Intravenous Every 24 hours 10/27/19 1956 10/29/19 1536   10/28/19 1600  cefTRIAXone (ROCEPHIN) 1 g in sodium chloride  0.9 % 100 mL IVPB  Status:  Discontinued        1 g 200 mL/hr over 30 Minutes Intravenous Every 24 hours 10/27/19 1956 10/29/19 1536   10/27/19 1645  cefTRIAXone (ROCEPHIN) 1 g in sodium chloride 0.9 % 100 mL IVPB        1 g 200 mL/hr over 30 Minutes Intravenous  Once 10/27/19 1630 10/27/19 1826   10/27/19 1645  azithromycin (ZITHROMAX) 500 mg in sodium chloride 0.9 % 250 mL IVPB        500 mg 250 mL/hr over 60 Minutes Intravenous  Once 10/27/19 1630 10/27/19 1957   10/27/19 1645  vancomycin (VANCOREADY) IVPB 1250 mg/250 mL        1,250 mg 166.7 mL/hr over 90 Minutes Intravenous  Once 10/27/19 1638 10/27/19 2015        Objective:   Vitals:   10/31/19 0630  10/31/19 0727 10/31/19 0800 10/31/19 0914  BP: (!) 125/59  128/66   Pulse: 72 84 74   Resp: (!) 24 (!) 29 (!) 24   Temp:  97.8 F (36.6 C)    TempSrc:  Axillary    SpO2: 99% 99% 99%   Weight:      Height:    5' 8"  (1.727 m)    Wt Readings from Last 3 Encounters:  10/31/19 70.4 kg  01/11/19 59 kg  01/09/15 65.8 kg     Intake/Output Summary (Last 24 hours) at 10/31/2019 0931 Last data filed at 10/31/2019 0811 Gross per 24 hour  Intake 5358.4 ml  Output 5520 ml  Net -161.6 ml    Physical Exam  Gen:-Intubated and sedated  HEENT:-ET tube  Neck-No JVD,.  Lungs--few scattered rhonchi, air movement is symmetrical CV- S1, S2 normal, regular  Abd-  +ve B.Sounds, Abd Soft, ND Extremity/Skin:- No  edema, pedal pulses present NeuroPsych-Limited exam as patient is intubated and sedated and on both fentanyl and propofol drips GU --- Foley with clear urine   Data Review:   Micro Results Recent Results (from the past 240 hour(s))  Blood Culture (routine x 2)     Status: None (Preliminary result)   Collection Time: 10/27/19  1:50 PM   Specimen: BLOOD LEFT HAND  Result Value Ref Range Status   Specimen Description BLOOD LEFT HAND  Final   Special Requests   Final    BOTTLES DRAWN AEROBIC AND ANAEROBIC Blood Culture  results may not be optimal due to an inadequate volume of blood received in culture bottles   Culture   Final    NO GROWTH 3 DAYS Performed at [facility], [address], [place], [place] [address]    Report Status PENDING  Incomplete  SARS Coronavirus 2 by RT PCR (hospital order, performed in [place] hospital lab) Nasopharyngeal Nasopharyngeal Swab     Status: None   Collection Time: 10/27/19  2:14 PM   Specimen: Nasopharyngeal Swab  Result Value Ref Range Status   SARS Coronavirus 2 NEGATIVE NEGATIVE Final    Comment: (NOTE) SARS-CoV-2 target nucleic acids are NOT DETECTED.  The SARS-CoV-2 RNA is generally detectable in upper and lower respiratory specimens during the acute phase of infection. The lowest concentration of SARS-CoV-2 viral copies this assay can detect is 250 copies / mL. A negative result does not preclude SARS-CoV-2 infection and should not be used as the sole basis for treatment or other patient management decisions.  A negative result may occur with improper specimen collection / handling, submission of specimen other than nasopharyngeal swab, presence of viral mutation(s) within the areas targeted by this assay, and inadequate number of viral copies (<250 copies / mL). A negative result must be combined with clinical observations, patient history, and epidemiological information.  Fact Sheet for Patients:   StrictlyIdeas.no  Fact Sheet for Healthcare Providers: BankingDealers.co.za  This test is not yet approved or  cleared by the [place] FDA and has been authorized for detection and/or diagnosis of SARS-CoV-2 by FDA under an Emergency Use Authorization (EUA).  This EUA will remain in effect (meaning this test can be used) for the duration of the COVID-19 declaration under Section 564(b)(1) of the Act, 21 U.S.C. section 360bbb-3(b)(1), unless the authorization is terminated or revoked  sooner.  Performed at [facility], [address] [place], [place] [address]   Blood Culture (routine x 2)     Status: None (Preliminary result)   Collection  Time: 10/27/19  2:39 PM   Specimen: BLOOD RIGHT HAND  Result Value Ref Range Status   Specimen Description BLOOD RIGHT HAND  Final   Special Requests   Final    BOTTLES DRAWN AEROBIC AND ANAEROBIC Blood Culture adequate volume   Culture   Final    NO GROWTH 3 DAYS Performed at [facility], [address] [place], [place] [address]    Report Status PENDING  Incomplete  MRSA PCR Screening     Status: None   Collection Time: 10/27/19  9:30 PM   Specimen: Nasopharyngeal  Result Value Ref Range Status   MRSA by PCR NEGATIVE NEGATIVE Final    Comment:        The GeneXpert MRSA Assay (FDA approved for NASAL specimens only), is one component of a comprehensive MRSA colonization surveillance program. It is not intended to diagnose MRSA infection nor to guide or monitor treatment for MRSA infections. Performed at [facility], [address] [place], [place] [address]   SARS Coronavirus 2 by RT PCR (hospital order, performed in [facility] hospital lab) Nasopharyngeal     Status: None   Collection Time: 10/27/19  9:32 PM   Specimen: Nasopharyngeal  Result Value Ref Range Status   SARS Coronavirus 2 NEGATIVE NEGATIVE Final    Comment: (NOTE) SARS-CoV-2 target nucleic acids are NOT DETECTED.  The SARS-CoV-2 RNA is generally detectable in upper and lower respiratory specimens during the acute phase of infection. The lowest concentration of SARS-CoV-2 viral copies this assay can detect is 250 copies / mL. A negative result does not preclude SARS-CoV-2 infection and should not be used as the sole basis for treatment or other patient management decisions.  A negative result may occur with improper specimen collection / handling, submission of specimen other than nasopharyngeal swab, presence of viral mutation(s) within  the areas targeted by this assay, and inadequate number of viral copies (<250 copies / mL). A negative result must be combined with clinical observations, patient history, and epidemiological information.  Fact Sheet for Patients:   StrictlyIdeas.no  Fact Sheet for Healthcare Providers: BankingDealers.co.za  This test is not yet approved or  cleared by the [place] FDA and has been authorized for detection and/or diagnosis of SARS-CoV-2 by FDA under an Emergency Use Authorization (EUA).  This EUA will remain in effect (meaning this test can be used) for the duration of the COVID-19 declaration under Section 564(b)(1) of the Act, 21 U.S.C. section 360bbb-3(b)(1), unless the authorization is terminated or revoked sooner.  Performed at [facility], [address], [place], [place] [address]   Culture, respiratory (non-expectorated)     Status: None (Preliminary result)   Collection Time: 10/28/19  1:56 PM   Specimen: Tracheal Aspirate; Respiratory  Result Value Ref Range Status   Specimen Description   Final    TRACHEAL ASPIRATE Performed at [facility], [address] [place], [place] [address]    Special Requests   Final    Normal Performed at [address] [place], [place] [address]    Gram Stain   Final    MODERATE WBC PRESENT, PREDOMINANTLY PMN RARE SQUAMOUS EPITHELIAL CELLS PRESENT ABUNDANT YEAST FEW GRAM POSITIVE COCCI    Culture   Final    CULTURE REINCUBATED FOR BETTER GROWTH Performed at [facility], [place] [address], [place],  [address]    Report Status PENDING  Incomplete  SARS Coronavirus 2 by RT PCR (hospital order, performed in [facility]  hospital lab) Nasopharyngeal Nasopharyngeal Swab     Status: None   Collection Time: 10/29/19  9:29 AM   Specimen: Nasopharyngeal Swab  Result Value Ref Range Status   SARS Coronavirus 2 NEGATIVE NEGATIVE Final    Comment: (NOTE) SARS-CoV-2  target nucleic acids are NOT DETECTED.  The SARS-CoV-2 RNA is generally detectable in upper and lower respiratory specimens during the acute phase of infection. The lowest concentration of SARS-CoV-2 viral copies this assay can detect is 250 copies / mL. A negative result does not preclude SARS-CoV-2 infection and should not be used as the sole basis for treatment or other patient management decisions.  A negative result may occur with improper specimen collection / handling, submission of specimen other than nasopharyngeal swab, presence of viral mutation(s) within the areas targeted by this assay, and inadequate number of viral copies (<250 copies / mL). A negative result must be combined with clinical observations, patient history, and epidemiological information.  Fact Sheet for Patients:   StrictlyIdeas.no  Fact Sheet for Healthcare Providers: BankingDealers.co.za  This test is not yet approved or  cleared by the [place] FDA and has been authorized for detection and/or diagnosis of SARS-CoV-2 by FDA under an Emergency Use Authorization (EUA).  This EUA will remain in effect (meaning this test can be used) for the duration of the COVID-19 declaration under Section 564(b)(1) of the Act, 21 U.S.C. section 360bbb-3(b)(1), unless the authorization is terminated or revoked sooner.  Performed at [facility], [address], [place], [place] [address]     Radiology Reports CT Angio Chest PE W and/or Wo Contrast  Result Date: 10/28/2019 CLINICAL DATA:  34 year old female with history of shortness of breath for the past 4 days. Fever. EXAM: CT ANGIOGRAPHY CHEST WITH CONTRAST TECHNIQUE: Multidetector CT imaging of the chest was performed using the standard protocol during bolus administration of intravenous contrast. Multiplanar CT image reconstructions and MIPs were obtained to evaluate the vascular anatomy. CONTRAST:  64m OMNIPAQUE  IOHEXOL 350 MG/ML SOLN COMPARISON:  Chest CT 01/09/2015. FINDINGS: Cardiovascular: Tiny nonobstructive filling defects are noted within subsegmental pulmonary artery branches to the right lower lobe (axial image 194 of series 5) and the left upper lobe (axial image 155 of series 5). No other larger central, lobar or segmental sized filling defects are noted. Heart size is normal. There is no significant pericardial fluid, thickening or pericardial calcification. No atherosclerotic calcifications in the thoracic aorta or coronary arteries. Mediastinum/Nodes: No pathologically enlarged mediastinal or hilar lymph nodes. Esophagus is unremarkable in appearance. No axillary lymphadenopathy. Lungs/Pleura: Widespread areas of severe ground-glass attenuation are noted scattered throughout the lungs bilaterally. No pleural effusions. Mild diffuse bronchial wall thickening with moderate centrilobular and paraseptal emphysema. Upper Abdomen: Unremarkable. Musculoskeletal: There are no aggressive appearing lytic or blastic lesions noted in the visualized portions of the skeleton. Review of the MIP images confirms the above findings. IMPRESSION: 1. Small nonocclusive pulmonary emboli in the lungs bilaterally, as above. 2. The appearance of the lungs is highly concerning for severe multilobar bilateral pneumonia, likely from atypical viral etiologies such as COVID infection. 3. Diffuse bronchial wall thickening with moderate centrilobular and paraseptal emphysema; imaging findings suggestive of underlying COPD. These results will be called to the ordering clinician or representative by the Radiologist Assistant, and communication documented in the PACS or [employer] Emphysema (ICD10-J43.9). Electronically Signed   By: DVinnie LangtonM.D.   On: 10/28/2019 09:37   [place]Abdomen Limited  Result Date: 10/29/2019 CLINICAL DATA:  Elevated  LFTs EXAM: ULTRASOUND ABDOMEN LIMITED RIGHT UPPER QUADRANT COMPARISON:  January 11, 2019. FINDINGS: Gallbladder: Status post cholecystectomy. Common bile duct: Diameter: 2 mm, normal Liver: No focal lesion identified. Mildly heterogeneous liver echotexture. Portal vein is patent on color Doppler imaging with normal direction of blood flow towards the liver. Other: None. IMPRESSION: Mildly heterogeneous liver echotexture without focal lesion identified. Electronically Signed   By: Valentino Saxon MD   On: 10/29/2019 09:36   US Venous Img Lower Bilateral (DVT)  Result Date: 10/28/2019 CLINICAL DATA:  Acute respiratory failure. Small volume bilateral pulmonary embolism. Evaluate for DVT. EXAM: BILATERAL LOWER EXTREMITY VENOUS DOPPLER ULTRASOUND TECHNIQUE: Gray-scale sonography with graded compression, as well as color Doppler and duplex ultrasound were performed to evaluate the lower extremity deep venous systems from the level of the common femoral vein and including the common femoral, femoral, profunda femoral, popliteal and calf veins including the posterior tibial, peroneal and gastrocnemius veins when visible. The superficial great saphenous vein was also interrogated. Spectral Doppler was utilized to evaluate flow at rest and with distal augmentation maneuvers in the common femoral, femoral and popliteal veins. COMPARISON:  None. FINDINGS: RIGHT LOWER EXTREMITY Common Femoral Vein: No evidence of thrombus. Normal compressibility, respiratory phasicity and response to augmentation. Saphenofemoral Junction: No evidence of thrombus. Normal compressibility and flow on color Doppler imaging. Profunda Femoral Vein: No evidence of thrombus. Normal compressibility and flow on color Doppler imaging. Femoral Vein: No evidence of thrombus. Normal compressibility, respiratory phasicity and response to augmentation. Popliteal Vein: No evidence of thrombus. Normal compressibility, respiratory phasicity and response to augmentation. Calf Veins: No evidence of thrombus. Normal compressibility and flow  on color Doppler imaging. Superficial Great Saphenous Vein: No evidence of thrombus. Normal compressibility. Venous Reflux:  None. Other Findings:  None. LEFT LOWER EXTREMITY Common Femoral Vein: No evidence of thrombus. Normal compressibility, respiratory phasicity and response to augmentation. Saphenofemoral Junction: No evidence of thrombus. Normal compressibility and flow on color Doppler imaging. Profunda Femoral Vein: No evidence of thrombus. Normal compressibility and flow on color Doppler imaging. Femoral Vein: No evidence of thrombus. Normal compressibility, respiratory phasicity and response to augmentation. Popliteal Vein: No evidence of thrombus. Normal compressibility, respiratory phasicity and response to augmentation. Calf Veins: No evidence of thrombus. Normal compressibility and flow on color Doppler imaging. Superficial Great Saphenous Vein: No evidence of thrombus. Normal compressibility. Venous Reflux:  None. Other Findings:  None. IMPRESSION: No evidence of DVT within either lower extremity. Electronically Signed   By: Sandi Mariscal M.D.   On: 10/28/2019 16:50   DG CHEST PORT 1 VIEW  Result Date: 10/29/2019 CLINICAL DATA:  34 year old female with respiratory failure hypoxia. EXAM: PORTABLE CHEST 1 VIEW COMPARISON:  Chest radiograph dated 10/28/2019 and CT dated 10/28/2019. FINDINGS: Endotracheal tube above the carina and enteric tube extends below the diaphragm with tip the on the inferior margin of the image and side-port in the region of the stomach. Bilateral mid to lower lung field airspace opacities similar to prior radiograph. No large pleural effusion. No pneumothorax. Mild cardiomegaly. No acute osseous pathology. IMPRESSION: 1. No significant interval change in bilateral mid to lower lung field airspace opacities. 2. Stable support apparatus. Electronically Signed   By: Anner Crete M.D.   On: 10/29/2019 03:46   DG Chest Portable 1 View  Result Date: 10/28/2019 CLINICAL DATA:   Status post intubation today. EXAM: PORTABLE CHEST 1 VIEW COMPARISON:  Single-view of the chest 10/27/2019. FINDINGS: New NG tube is in place in good position  with the side-port in the stomach. Endotracheal tube is also in good position with the tip just below the clavicular heads. Right worse than left airspace disease has progressed since yesterday's exam. No pneumothorax or pleural fluid. Heart size normal. IMPRESSION: ETT and NG tube in good position. Worsened airspace disease consistent with progressive pneumonia. Electronically Signed   By: Inge Rise M.D.   On: 10/28/2019 11:42   DG Chest Port 1 View  Result Date: 10/27/2019 CLINICAL DATA:  Shortness of breath. EXAM: PORTABLE CHEST 1 VIEW COMPARISON:  January 09, 2015. FINDINGS: The heart size and mediastinal contours are within normal limits. No pneumothorax or pleural effusion is noted. Multiple ill-defined airspace opacities are noted bilaterally concerning for multifocal pneumonia. The visualized skeletal structures are unremarkable. IMPRESSION: Bilateral multifocal pneumonia. Electronically Signed   By: Marijo Conception M.D.   On: 10/27/2019 14:27   ECHOCARDIOGRAM COMPLETE  Result Date: 10/28/2019    ECHOCARDIOGRAM REPORT   Patient Name:   Diane Norton Date of Exam: 10/28/2019 Medical Rec #:  [id number]       Height:       64.0 in Accession #:    [id number]      Weight:       120.0 lb Date of Birth:  1985/05/21        BSA:          1.575 m Patient Age:    73 years        BP:           130/94 mmHg Patient Gender: F               HR:           116 bpm. Exam Location:  [facility] Procedure: 2D Echo, Cardiac Doppler and Color Doppler Indications:    Pulmonary Embolus 415.19 / I26.99  History:        Patient has no prior history of Echocardiogram examinations.                 Multifocal pneumonia, Acute respiratory failure with hypoxia.  Sonographer:    Alvino Chapel RCS Referring Phys: [id number] Amylee Lodato IMPRESSIONS  1. Left ventricular  ejection fraction, by estimation, is 55 to 60%. The left ventricle has normal function. The left ventricle has no regional wall motion abnormalities. Left ventricular diastolic parameters are indeterminate.  2. Right ventricular systolic function is moderately reduced. The right ventricular size is severely enlarged. There is moderately elevated pulmonary artery systolic pressure.  3. The mitral valve is normal in structure. Mild mitral valve regurgitation. No evidence of mitral stenosis.  4. The aortic valve has an indeterminant number of cusps. Aortic valve regurgitation is not visualized. No aortic stenosis is present. FINDINGS  Left Ventricle: Left ventricular ejection fraction, by estimation, is 55 to 60%. The left ventricle has normal function. The left ventricle has no regional wall motion abnormalities. The left ventricular internal cavity size was normal in size. There is  no left ventricular hypertrophy. Left ventricular diastolic parameters are indeterminate. Right Ventricle: There is septal flattening in systole. The right ventricular size is severely enlarged. No increase in right ventricular wall thickness. Right ventricular systolic function is moderately reduced. There is moderately elevated pulmonary artery systolic pressure. The tricuspid regurgitant velocity is 3.23 m/s, and with an assumed right atrial pressure of 10 mmHg, the estimated right ventricular systolic pressure is 88.5 mmHg. Left Atrium: Left atrial size was normal in size. Right Atrium: Right atrial size  was normal in size. Pericardium: There is no evidence of pericardial effusion. Mitral Valve: The mitral valve is normal in structure. Mild mitral valve regurgitation. No evidence of mitral valve stenosis. Tricuspid Valve: The tricuspid valve is normal in structure. Tricuspid valve regurgitation is mild . No evidence of tricuspid stenosis. Aortic Valve: The aortic valve has an indeterminant number of cusps. Aortic valve regurgitation  is not visualized. No aortic stenosis is present. Aortic valve mean gradient measures 2.7 mmHg. Aortic valve peak gradient measures 4.9 mmHg. Aortic valve area, by VTI measures 1.50 cm. Pulmonic Valve: The pulmonic valve was not well visualized. Pulmonic valve regurgitation is not visualized. No evidence of pulmonic stenosis. Aorta: The aortic root is normal in size and structure. Venous: IVC assessment for right atrial pressure unable to be performed due to mechanical ventilation. IAS/Shunts: No atrial level shunt detected by color flow Doppler.  LEFT VENTRICLE PLAX 2D LVIDd:         3.94 cm  Diastology LVIDs:         2.98 cm  LV e' lateral:   19.90 cm/s LV PW:         0.80 cm  LV E/e' lateral: 4.6 LV IVS:        0.83 cm  LV e' medial:    14.40 cm/s LVOT diam:     1.70 cm  LV E/e' medial:  6.3 LV SV:         27 LV SV Index:   17 LVOT Area:     2.27 cm  RIGHT VENTRICLE RV S prime:     15.70 cm/s TAPSE (M-mode): 2.2 cm LEFT ATRIUM           Index       RIGHT ATRIUM           Index LA diam:      2.20 cm 1.40 cm/m  RA Area:     17.40 cm LA Vol (A4C): 34.9 ml 22.16 ml/m RA Volume:   48.50 ml  30.80 ml/m  AORTIC VALVE AV Area (Vmax):    1.78 cm AV Area (Vmean):   1.50 cm AV Area (VTI):     1.50 cm AV Vmax:           110.51 cm/s AV Vmean:          78.791 cm/s AV VTI:            0.178 m AV Peak Grad:      4.9 mmHg AV Mean Grad:      2.7 mmHg LVOT Vmax:         86.50 cm/s LVOT Vmean:        52.200 cm/s LVOT VTI:          0.117 m LVOT/AV VTI ratio: 0.66  AORTA Ao Root diam: 3.10 cm MITRAL VALVE               TRICUSPID VALVE MV Area (PHT): 6.96 cm    TR Peak grad:   41.7 mmHg MV Decel Time: 109 msec    TR Vmax:        323.00 cm/s MV E velocity: 90.90 cm/s                            SHUNTS                            Systemic VTI:  0.12  m                            Systemic Diam: 1.70 cm Carlyle Dolly MD Electronically signed by Carlyle Dolly MD Signature Date/Time: 10/28/2019/3:31:55 PM    Final       CBC Recent Labs  Lab 10/27/19 1438 10/28/19 0331 10/29/19 0408 10/29/19 0408 10/29/19 1157 10/29/19 1337 10/30/19 0354 10/30/19 1331 10/31/19 0419  WBC 13.8*   < > 18.0*  --  16.4*  --  10.4 10.1 7.7  HGB 10.3*   < > 9.7*  --  8.8*  --  7.8* 7.5* 7.8*  HCT 31.3*   < > 30.2*  --  27.4*  --  24.0* 24.1* 25.3*  PLT 217   < > 39*   < > 37* 36* 38* 45* 62*  MCV 83.5   < > 84.1  --  84.6  --  84.5 87.0 87.8  MCH 27.5   < > 27.0  --  27.2  --  27.5 27.1 27.1  MCHC 32.9   < > 32.1  --  32.1  --  32.5 31.1 30.8  RDW 19.7*   < > 21.6*  --  21.1*  --  20.4* 20.3* 19.9*  LYMPHSABS 0.7  --   --   --   --   --   --   --   --   MONOABS 0.3  --   --   --   --   --   --   --   --   EOSABS 0.1  --   --   --   --   --   --   --   --   BASOSABS 0.1  --   --   --   --   --   --   --   --    < > = values in this interval not displayed.    Chemistries  Recent Labs  Lab 10/27/19 1438 10/27/19 1438 10/28/19 0331 10/28/19 0331 10/28/19 0941 10/29/19 0408 10/30/19 0354 10/30/19 1139 10/30/19 1331 10/31/19 0419  NA 135   < > 138   < > 137 138 140  --  141 141  K 2.7*   < > 4.0   < > 4.1 3.9 3.4*  --  3.1* 3.8  CL 106   < > 112*   < > 112* 113* 111  --  112* 109  CO2 16*   < > 15*   < > 15* 17* 23  --  23 24  GLUCOSE 96   < > 125*   < > 121* 150* 124*  --  101* 110*  BUN 19   < > 18   < > 18 24* 11  --  9 10  CREATININE 0.89   < > 0.66   < > 0.65 0.71 0.50  --  0.53 0.42*  CALCIUM 8.0*   < > 7.7*   < > 7.8* 7.7* 7.7*  --  7.5* 7.6*  MG 2.0  --  2.6*  --   --   --   --  1.9  --   --   AST 26   < > 26  --  22 123* 32  --  34  --   ALT 12   < > 16  --  15 59* 43  --  43  --   ALKPHOS 127*   < >  162*  --  150* 130* 97  --  92  --   BILITOT 0.5   < > 0.8  --  0.8 0.6 0.4  --  0.6  --    < > = values in this interval not displayed.   ------------------------------------------------------------------------------------------------------------------ Recent Labs    10/30/19 0354 10/31/19 0419   TRIG 136 110    No results found for: HGBA1C ------------------------------------------------------------------------------------------------------------------ No results for input(s): TSH, T4TOTAL, T3FREE, THYROIDAB in the last 72 hours.  Invalid input(s): FREET3 ------------------------------------------------------------------------------------------------------------------ Recent Labs    10/29/19 1905  RETICCTPCT 1.5    Coagulation profile Recent Labs  Lab 10/29/19 1337 10/30/19 0354 10/30/19 1331 10/31/19 0419  INR 1.5* 1.4* 1.4* 1.3*    Recent Labs    10/29/19 1337  DDIMER >20.00*    Cardiac Enzymes No results for input(s): CKMB, TROPONINI, MYOGLOBIN in the last 168 hours.  Invalid input(s): CK ------------------------------------------------------------------------------------------------------------------    Component Value Date/Time   BNP 271.0 (H) 10/27/2019 1438   Roxan Hockey M.D on 10/31/2019 at 9:31 AM  Go to [email or web address] - for contact info  [facility] - Office  [phone]

## 2019-11-01 ENCOUNTER — Inpatient Hospital Stay: Payer: Self-pay

## 2019-11-01 LAB — BASIC METABOLIC PANEL
Anion gap: 11 (ref 5–15)
BUN: 13 mg/dL (ref 6–20)
CO2: 25 mmol/L (ref 22–32)
Calcium: 7.6 mg/dL — ABNORMAL LOW (ref 8.9–10.3)
Chloride: 107 mmol/L (ref 98–111)
Creatinine, Ser: 0.37 mg/dL — ABNORMAL LOW (ref 0.44–1.00)
GFR calc Af Amer: >60 mL/min (ref >60)
GFR calc non Af Amer: >60 mL/min (ref >60)
Glucose, Bld: 106 mg/dL — ABNORMAL HIGH (ref 70–99)
Potassium: 2.8 mmol/L — ABNORMAL LOW (ref 3.5–5.1)
Sodium: 143 mmol/L (ref 135–145)

## 2019-11-01 LAB — LACTATE DEHYDROGENASE: LDH: 421 U/L — ABNORMAL HIGH (ref 98–192)

## 2019-11-01 LAB — CBC
HCT: 24.9 % — ABNORMAL LOW (ref 36.0–46.0)
Hemoglobin: 7.8 g/dL — ABNORMAL LOW (ref 12.0–15.0)
MCH: 27.8 pg (ref 26.0–34.0)
MCHC: 31.3 g/dL (ref 30.0–36.0)
MCV: 88.6 fL (ref 80.0–100.0)
Platelets: 119 10*3/uL — ABNORMAL LOW (ref 150–400)
RBC: 2.81 MIL/uL — ABNORMAL LOW (ref 3.87–5.11)
RDW: 20.3 % — ABNORMAL HIGH (ref 11.5–15.5)
WBC: 9.4 10*3/uL (ref 4.0–10.5)
nRBC: 0.4 % — ABNORMAL HIGH (ref 0.0–0.2)

## 2019-11-01 LAB — FIBRINOGEN: Fibrinogen: 213 mg/dL (ref 210–475)

## 2019-11-01 LAB — FACTOR 5 ASSAY: Factor V Activity: 94 % (ref 70–150)

## 2019-11-01 LAB — HEPARIN LEVEL (UNFRACTIONATED)
Heparin Unfractionated: 0.13 IU/mL — ABNORMAL LOW (ref 0.30–0.70)
Heparin Unfractionated: 0.48 IU/mL (ref 0.30–0.70)
Heparin Unfractionated: 0.34 IU/mL (ref 0.30–0.70)

## 2019-11-01 LAB — PROTIME-INR
INR: 1.2 (ref 0.8–1.2)
Prothrombin Time: 15.1 seconds (ref 11.4–15.2)

## 2019-11-01 LAB — GLUCOSE, CAPILLARY
Glucose-Capillary: 105 mg/dL — ABNORMAL HIGH (ref 70–99)
Glucose-Capillary: 110 mg/dL — ABNORMAL HIGH (ref 70–99)
Glucose-Capillary: 112 mg/dL — ABNORMAL HIGH (ref 70–99)
Glucose-Capillary: 113 mg/dL — ABNORMAL HIGH (ref 70–99)
Glucose-Capillary: 122 mg/dL — ABNORMAL HIGH (ref 70–99)

## 2019-11-01 LAB — FACTOR 2 ASSAY: Factor II Activity: 99 % (ref 50–154)

## 2019-11-01 LAB — CULTURE, BLOOD (ROUTINE X 2)
Culture: NO GROWTH
Culture: NO GROWTH
Special Requests: ADEQUATE

## 2019-11-01 LAB — ADAMTS13 ACTIVITY REFLEX

## 2019-11-01 LAB — ADAMTS13 ACTIVITY: Adamts 13 Activity: 59.9 % — ABNORMAL LOW (ref >66.8)

## 2019-11-01 LAB — TRIGLYCERIDES: Triglycerides: 171 mg/dL — ABNORMAL HIGH (ref <150)

## 2019-11-01 LAB — FACTOR 10 ASSAY: Factor X Activity: 92 % (ref 76–183)

## 2019-11-01 LAB — FACTOR 7 ASSAY: Factor VII Activity: 93 % (ref 51–186)

## 2019-11-01 MED ORDER — SODIUM CHLORIDE 0.9 % IV BOLUS
1000.0000 mL | Freq: Once | INTRAVENOUS | Status: AC
  Administered 2019-11-02: 1000 mL via INTRAVENOUS

## 2019-11-01 MED ORDER — ALBUTEROL SULFATE (2.5 MG/3ML) 0.083% IN NEBU
INHALATION_SOLUTION | RESPIRATORY_TRACT | Status: AC
  Administered 2019-11-01: 2.5 mg
  Filled 2019-11-01: qty 3

## 2019-11-01 NOTE — Progress Notes (Signed)
[facility] called to schedule PICC Line placement.

## 2019-11-01 NOTE — Progress Notes (Signed)
 -  Poor venous access in the patient requiring IV Dilaudid drip, IV propofol drip--- currently intubated and sedated -Also requiring IV antibiotics and fluids - Patient is Picc line/midline--- unable to reach mother or brother to get consent for procedure - Please see documentation from RN Shon Baton  Given-medical necessity for venous access will proceed with PICC line/midline attempt  Shon Hale, MD

## 2019-11-01 NOTE — Progress Notes (Signed)
Patient Demographics:    Diane Norton, is a 34 y.o. female, DOB - May 25, 1985, EGB:[id number]  Admit date - 10/27/2019   Admitting Physician Artie Mcintyre Denton Brick, MD  Outpatient Primary MD for the patient is Patient, No Pcp Per  LOS - 5   Chief Complaint  Patient presents with  . Shortness of Breath        Subjective:    Diane Norton   -More peaceful on IV propofol and IV Dilaudid drip -- -Platelets and fibrinogen improving----no bleeding concerns at this time -Tolerating tube feed well -Had BM  Assessment  & Plan :    Principal Problem:   Acute Hypoxic respiratory failure ([place]) Active Problems:   DIC (disseminated intravascular coagulation) ([place])   Acute pulmonary embolism (H[facility])   Asthma   Multifocal pneumonia   History of seizures   PNA (pneumonia)   Thrombocytopenia ([place])  Brief summary 34 year old Caucasian female with past medical history relevant for ongoing heroin abuse, 2 pack-a-day smoker with COPD/emphysema, history of underlying asthma, history of seizures with no recent seizure episodes and 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 admitted on 10/27/2019 with hypoxic respiratory failure secondary to combination of to bilateral nonocclusive PE, bilateral multifocal pneumonia and COPD exacerbation--- hypoxic respiratory failure worsened, patient was confused and not compliant with oxygen therapy, intubated 10/28/2019 -Currently on IV propofol and IV dilaudid drips --- Diane Norton is 59.9 %--- which would be inconsistent with TTP, this is more consistent with DIC   A/p 1)Acute hypoxic respiratory failure----secondary to bilateral pneumonia, COPD/emphysema flareup and bilateral nonocclusive PE---- --Respiratory failure and hypoxia worsened, discussed with EDP Dr. Reather Converse, decision made to intubate on 10/28/19 -completed COVID-19 vaccination [employer]) in  April  2021 -COVID-19 test negative x 3 times  this admission  2) bilateral multifocal pneumonia---- intubated for hypoxic respiratory failure as above #1 --Bronchodilators, -Strep pneumo negative, -Legionella neg PCT 2.08 >>0.81>>4.68---  ---stopped azithromycin and Rocephin on 10/29/2019  --started on meropenem and doxycycline on 10/29/19  3) acute COPD/emphysema exacerbation--- secondary to #2 above, contributing to #1 above --Patient is a 2 pack-a-day smoker -IV Solu-Medrol and bronchodilators as ordered  4) heroin addiction--- patient uses heroin on admission at least $50 worth of heroin on a daily basis, she does not inject apparently she snorts heroin -As needed lorazepam and continuous propofol drip as ordered -Currently on IV dilaudid drip  5)Bilateral nonocclusive pulmonary embolism--- patient received therapeutic dose of Lovenox on 10/27/2019 --Started IV heparin drip on 10/28/2019, discontinued IV heparin on 10/29/2019 due to worsening thrombocytopenia -- given improving fibrinogen, platelet count and improving DIC  profile  -- restarted IV heparin for PE without bolus on 10/31/2019 -Echo with EF of 55 to 60%, with severe right ventricular enlargement and moderate pulmonary hypertension -Lower extremity venous Dopplers negative  6)Social/Ethics---Spoke with Brother --- Diane Norton at [phone] -- Brother unable to visit as he was dxed with covid PNA on 10/10/19 and he was just dced from hospital last week  Pt's brother stated that Diane Norton was not around him recently (not exposed to him while he had covid) --  Spoke with Mother---Diane Norton [phone] Updated Both brother and mother----  56) history of seizures--no recent seizures, currently intubated and sedated with propofol along  with as needed lorazepam  8) acute on chronic anemia and acute thrombocytopenia/DIC--- on admission on 10/27/2019 platelet count was 217, patient received Lovenox 80 mg subcu x1 on 10/27/2019 -Patient was  started on IV heparin for PE on 10/28/2019 in a.m. -patient received therapeutic dose of Lovenox on 10/27/2019 --Started IV heparin drip on 10/28/2019 in am , discontinued IV heparin on 10/29/2019 due to worsening thrombocytopenia -- given improving fibrinogen, platelet count and improving DIC  profile  -- restarted IV heparin for PE without bolus on 10/31/2019 Platelets 217>>142>>116>>39>>37>>38>>45>>61>>119 Hgb 10.8 >>> 8.8 >>7.8>>7.5>.7.8 LDH 664>>594>>532>> 523>>421 Fibrinogen  594>>79>>127>>>175>>213 inr 1.5>>>1.4>>1.3>>1.2 -- hematology consult from Dr. Delton Coombes appreciated -No obvious bleeding at this time ----HIT and DIC Norton results reviewed with Dr. Delton Coombes --DIC is more likely than HIT given the clinical presentation and lab findings -He advised stopping IV heparin, he advised against argatroban -- we gave FFP and cryoprecipitate 10/29/19 to reduce bleeding risk -Vitamin K given on 10/30/2019 --Since DIC may be triggered by antibiotics/medications--we will stop azithromycin and Rocephin which was started on admission and instead use meropenem and doxycycline -Patient has multiple allergies limiting choices of antibiotics -- Diane Norton is 59.9 --- which would be inconsistent with TTP, this is more consistent with DIC --- I called [facility] hematology transfer line----[facility] declined to accept patient for transfer due to lack of beds -Overall DIC pathophysiology appears to be improving/resolving at this time  9)Prophylaxis--Protonix for GI prophylaxis and , IV heparin for PE  10)FEN/hypokalemia and hypophosphatemia--replace potassium and phosphorous ---dietitian consult for tube feeding  -Tolerating vital 1.2 tube feeds at 40 mL an hour  CRITICAL CARE Performed by: Roxan Hockey   Total critical care time: 44 minutes  Critical care time was exclusive of separately billable procedures and treating other patients.  -acute hypoxic respiratory failure requiring  intubation, sedation, IV antibiotics   Vent settings:- PRVC/35%/5/24/540 -A lot more calm on IV propofol and IV Dilaudid drips  Critical care was necessary to treat or prevent imminent or life-threatening deterioration.  Critical care was time spent personally by me on the following activities: development of treatment plan with patient and/or surrogate as well as nursing, discussions with consultants, evaluation of patient's response to treatment, examination of patient, obtaining history from patient or surrogate, ordering and performing treatments and interventions, ordering and review of laboratory studies, ordering and review of radiographic studies, pulse oximetry and re-evaluation of patient's condition.   Disposition/Need for in-Hospital Stay- patient unable to be discharged at this time due to --- -acute hypoxic respiratory failure requiring intubation/mechanical ventilation and, sedation, IV antibiotics with plan for DIC, and pulmonary embolism requiring IV heparin  Status is: Inpatient  Remains inpatient appropriate because:acute hypoxic respiratory failure requiring intubation/mechanical ventilation and, sedation, IV antibiotics with plan for DIC, and pulmonary embolism requiring IV heparin   Disposition: The patient is from: Home              Anticipated d/c is to: TBD              Anticipated d/c date is: > 3 days              Patient currently is not medically stable to d/c. Barriers: Not Clinically Stable-  -acute hypoxic respiratory failure requiring intubation/mechanical ventilation and, sedation, IV antibiotics with plan for DIC, and pulmonary embolism requiring IV heparin  Code Status : Full  Family Communication:   -  D/w patient's mother Diane Diane Norton  -and pt Brother Diane Norton  K[place]ker at [phone]  Consults  :  PCCM/hematology  DVT Prophylaxis  : IV heparin  Lab Results  Component Value Date   PLT 119 (L) 11/01/2019    Inpatient Medications  Scheduled  Meds: . chlorhexidine gluconate (MEDLINE KIT)  15 mL Mouth Rinse BID  . Chlorhexidine Gluconate Cloth  6 each Topical Daily  . cloNIDine  0.1 mg Per Tube TID  . docusate  100 mg Per Tube BID  . feeding supplement (PROSource TF)  45 mL Per Tube TID  . feeding supplement (VITAL AF 1.2 CAL)  1,000 mL Per Tube Q24H  . folic acid  1 mg Oral Daily  . haloperidol lactate  5 mg Intramuscular Q6H  . ipratropium-albuterol  3 mL Nebulization Q4H  . mouth rinse  15 mL Mouth Rinse 10 times per day  . methylPREDNISolone (SOLU-MEDROL) injection  40 mg Intravenous Daily  . multivitamin with minerals  1 tablet Oral Daily  . pantoprazole sodium  40 mg Per Tube Q1200  . rocuronium  50 mg Intravenous Once  . sodium chloride flush  3 mL Intravenous Q12H  . thiamine  100 mg Oral Daily   Or  . thiamine  100 mg Intravenous Daily   Continuous Infusions: . sodium chloride    . dextrose 5 % and 0.45% NaCl 150 mL/hr at 11/01/19 1027  . doxycycline (VIBRAMYCIN) IV Stopped (11/01/19 0517)  . heparin 1,500 Units/hr (11/01/19 1027)  . HYDROmorphone 1 mg/hr (10/31/19 2240)  . meropenem (MERREM) IV Stopped (11/01/19 0831)  . propofol (DIPRIVAN) infusion 50 mcg/kg/min (11/01/19 1027)   PRN Meds:.sodium chloride, acetaminophen **OR** acetaminophen, HYDROmorphone (DILAUDID) injection, LORazepam, ondansetron **OR** ondansetron (ZOFRAN) IV, sodium chloride flush    Anti-infectives (From admission, onward)   Start     Dose/Rate Route Frequency Ordered Stop   10/29/19 1700  meropenem (MERREM) 1 g in sodium chloride 0.9 % 100 mL IVPB        1 g 200 mL/hr over 30 Minutes Intravenous Every 8 hours 10/29/19 1557     10/29/19 1600  doxycycline (VIBRAMYCIN) 100 mg in sodium chloride 0.9 % 250 mL IVPB        100 mg 125 mL/hr over 120 Minutes Intravenous Every 12 hours 10/29/19 1536     10/28/19 1800  vancomycin (VANCOCIN) IVPB 1000 mg/200 mL premix  Status:  Discontinued        1,000 mg 200 mL/hr over 60 Minutes  Intravenous Every 24 hours 10/27/19 1645 10/27/19 1956   10/28/19 1600  azithromycin (ZITHROMAX) 500 mg in sodium chloride 0.9 % 250 mL IVPB  Status:  Discontinued        500 mg 250 mL/hr over 60 Minutes Intravenous Every 24 hours 10/27/19 1956 10/29/19 1536   10/28/19 1600  cefTRIAXone (ROCEPHIN) 1 g in sodium chloride 0.9 % 100 mL IVPB  Status:  Discontinued        1 g 200 mL/hr over 30 Minutes Intravenous Every 24 hours 10/27/19 1956 10/29/19 1536   10/27/19 1645  cefTRIAXone (ROCEPHIN) 1 g in sodium chloride 0.9 % 100 mL IVPB        1 g 200 mL/hr over 30 Minutes Intravenous  Once 10/27/19 1630 10/27/19 1826   10/27/19 1645  azithromycin (ZITHROMAX) 500 mg in sodium chloride 0.9 % 250 mL IVPB        500 mg 250 mL/hr over 60 Minutes Intravenous  Once 10/27/19 1630 10/27/19 1957   10/27/19 1645  vancomycin (VANCOREADY) IVPB 1250 mg/250 mL  1,250 mg 166.7 mL/hr over 90 Minutes Intravenous  Once 10/27/19 1638 10/27/19 2015        Objective:   Vitals:   11/01/19 0930 11/01/19 0945 11/01/19 1000 11/01/19 1015  BP: 113/61 (!) 112/59 (!) 112/59 (!) 114/55  Pulse: 80 79 80 79  Resp: (!) 25 (!) 25 (!) 25 (!) 26  Temp:      TempSrc:      SpO2: 96% 96% 96% 96%  Weight:      Height:        Wt Readings from Last 3 Encounters:  11/01/19 66.5 kg  01/11/19 59 kg  01/09/15 65.8 kg     Intake/Output Summary (Last 24 hours) at 11/01/2019 1126 Last data filed at 11/01/2019 1027 Gross per 24 hour  Intake 6809.64 ml  Output 5460 ml  Net 1349.64 ml    Physical Exam  Gen:-Intubated and sedated  HEENT:-ET tube  Neck-No JVD,.  Lungs--improved air movement, no wheezing CV- S1, S2 normal, regular  Abd-  +ve B.Sounds, Abd Soft, ND Extremity/Skin:- No  edema, pedal pulses present NeuroPsych-Limited exam as patient is intubated and sedated and on both Dilaudid and propofol drips GU --- Foley with clear urine   Data Review:   Micro Results Recent Results (from the past 240  hour(s))  Blood Culture (routine x 2)     Status: None   Collection Time: 10/27/19  1:50 PM   Specimen: BLOOD LEFT HAND  Result Value Ref Range Status   Specimen Description BLOOD LEFT HAND  Final   Special Requests   Final    BOTTLES DRAWN AEROBIC AND ANAEROBIC Blood Culture results may not be optimal due to an inadequate volume of blood received in culture bottles   Culture   Final    NO GROWTH 5 DAYS Performed at [facility], [address], [place], [place] [address]    Report Status 11/01/2019 FINAL  Final  SARS Coronavirus 2 by RT PCR (hospital order, performed in [place] hospital lab) Nasopharyngeal Nasopharyngeal Swab     Status: None   Collection Time: 10/27/19  2:14 PM   Specimen: Nasopharyngeal Swab  Result Value Ref Range Status   SARS Coronavirus 2 NEGATIVE NEGATIVE Final    Comment: (NOTE) SARS-CoV-2 target nucleic acids are NOT DETECTED.  The SARS-CoV-2 RNA is generally detectable in upper and lower respiratory specimens during the acute phase of infection. The lowest concentration of SARS-CoV-2 viral copies this assay can detect is 250 copies / mL. A negative result does not preclude SARS-CoV-2 infection and should not be used as the sole basis for treatment or other patient management decisions.  A negative result may occur with improper specimen collection / handling, submission of specimen other than nasopharyngeal swab, presence of viral mutation(s) within the areas targeted by this assay, and inadequate number of viral copies (<250 copies / mL). A negative result must be combined with clinical observations, patient history, and epidemiological information.  Fact Sheet for Patients:   StrictlyIdeas.no  Fact Sheet for Healthcare Providers: BankingDealers.co.za  This test is not yet approved or  cleared by the [place] FDA and has been authorized for detection and/or diagnosis of SARS-CoV-2 by FDA under  an Emergency Use Authorization (EUA).  This EUA will remain in effect (meaning this test can be used) for the duration of the COVID-19 declaration under Section 564(b)(1) of the Act, 21 U.S.C. section 360bbb-3(b)(1), unless the authorization is terminated or revoked sooner.  Performed at [facility], [phone]  [address], [place], [place] [address]   Blood Culture (routine x 2)     Status: None   Collection Time: 10/27/19  2:39 PM   Specimen: BLOOD RIGHT HAND  Result Value Ref Range Status   Specimen Description BLOOD RIGHT HAND  Final   Special Requests   Final    BOTTLES DRAWN AEROBIC AND ANAEROBIC Blood Culture adequate volume   Culture   Final    NO GROWTH 5 DAYS Performed at [facility], [address] [place], [place] [address]    Report Status 11/01/2019 FINAL  Final  MRSA PCR Screening     Status: None   Collection Time: 10/27/19  9:30 PM   Specimen: Nasopharyngeal  Result Value Ref Range Status   MRSA by PCR NEGATIVE NEGATIVE Final    Comment:        The GeneXpert MRSA Assay (FDA approved for NASAL specimens only), is one component of a comprehensive MRSA colonization surveillance program. It is not intended to diagnose MRSA infection nor to guide or monitor treatment for MRSA infections. Performed at [facility], [address], [place], [place] [address]   SARS Coronavirus 2 by RT PCR (hospital order, performed in [facility] hospital lab) Nasopharyngeal     Status: None   Collection Time: 10/27/19  9:32 PM   Specimen: Nasopharyngeal  Result Value Ref Range Status   SARS Coronavirus 2 NEGATIVE NEGATIVE Final    Comment: (NOTE) SARS-CoV-2 target nucleic acids are NOT DETECTED.  The SARS-CoV-2 RNA is generally detectable in upper and lower respiratory specimens during the acute phase of infection. The lowest concentration of SARS-CoV-2 viral copies this assay can detect is 250 copies / mL. A negative result does not preclude SARS-CoV-2 infection and should not be  used as the sole basis for treatment or other patient management decisions.  A negative result may occur with improper specimen collection / handling, submission of specimen other than nasopharyngeal swab, presence of viral mutation(s) within the areas targeted by this assay, and inadequate number of viral copies (<250 copies / mL). A negative result must be combined with clinical observations, patient history, and epidemiological information.  Fact Sheet for Patients:   StrictlyIdeas.no  Fact Sheet for Healthcare Providers: BankingDealers.co.za  This test is not yet approved or  cleared by the [place] FDA and has been authorized for detection and/or diagnosis of SARS-CoV-2 by FDA under an Emergency Use Authorization (EUA).  This EUA will remain in effect (meaning this test can be used) for the duration of the COVID-19 declaration under Section 564(b)(1) of the Act, 21 U.S.C. section 360bbb-3(b)(1), unless the authorization is terminated or revoked sooner.  Performed at [facility], [address], [place], [place] [address]   Culture, respiratory (non-expectorated)     Status: None   Collection Time: 10/28/19  1:56 PM   Specimen: Tracheal Aspirate; Respiratory  Result Value Ref Range Status   Specimen Description   Final    TRACHEAL ASPIRATE Performed at [facility], [address], [place], [place] [address]    Special Requests   Final    Normal Performed at [facility], [address] [place], [place] [address]    Gram Stain   Final    MODERATE WBC PRESENT, PREDOMINANTLY PMN RARE SQUAMOUS EPITHELIAL CELLS PRESENT ABUNDANT YEAST FEW GRAM POSITIVE COCCI    Culture   Final    RARE Normal respiratory flora-no Staph aureus or Pseudomonas seen Performed at [place] [place] [address],  [place], [place] [address]    Report Status 10/31/2019 FINAL  Final  SARS Coronavirus 2 by RT PCR (hospital order, performed in  [facility] hospital lab) Nasopharyngeal Nasopharyngeal Swab     Status: None   Collection Time: 10/29/19  9:29 AM   Specimen: Nasopharyngeal Swab  Result Value Ref Range Status   SARS Coronavirus 2 NEGATIVE NEGATIVE Final    Comment: (NOTE) SARS-CoV-2 target nucleic acids are NOT DETECTED.  The SARS-CoV-2 RNA is generally detectable in upper and lower respiratory specimens during the acute phase of infection. The lowest concentration of SARS-CoV-2 viral copies this assay can detect is 250 copies / mL. A negative result does not preclude SARS-CoV-2 infection and should not be used as the sole basis for treatment or other patient management decisions.  A negative result may occur with improper specimen collection / handling, submission of specimen other than nasopharyngeal swab, presence of viral mutation(s) within the areas targeted by this assay, and inadequate number of viral copies (<250 copies / mL). A negative result must be combined with clinical observations, patient history, and epidemiological information.  Fact Sheet for Patients:   StrictlyIdeas.no  Fact Sheet for Healthcare Providers: BankingDealers.co.za  This test is not yet approved or  cleared by the [place] FDA and has been authorized for detection and/or diagnosis of SARS-CoV-2 by FDA under an Emergency Use Authorization (EUA).  This EUA will remain in effect (meaning this test can be used) for the duration of the COVID-19 declaration under Section 564(b)(1) of the Act, 21 U.S.C. section 360bbb-3(b)(1), unless the authorization is terminated or revoked sooner.  Performed at [facility], [address], [place], [place] [address]     Radiology Reports CT Angio Chest PE W and/or Wo Contrast  Result Date: 10/28/2019 CLINICAL DATA:  33 year old female with history of shortness of breath for the past 4 days. Fever. EXAM: CT ANGIOGRAPHY CHEST WITH CONTRAST  TECHNIQUE: Multidetector CT imaging of the chest was performed using the standard protocol during bolus administration of intravenous contrast. Multiplanar CT image reconstructions and MIPs were obtained to evaluate the vascular anatomy. CONTRAST:  10m OMNIPAQUE IOHEXOL 350 MG/ML SOLN COMPARISON:  Chest CT 01/09/2015. FINDINGS: Cardiovascular: Tiny nonobstructive filling defects are noted within subsegmental pulmonary artery branches to the right lower lobe (axial image 194 of series 5) and the left upper lobe (axial image 155 of series 5). No other larger central, lobar or segmental sized filling defects are noted. Heart size is normal. There is no significant pericardial fluid, thickening or pericardial calcification. No atherosclerotic calcifications in the thoracic aorta or coronary arteries. Mediastinum/Nodes: No pathologically enlarged mediastinal or hilar lymph nodes. Esophagus is unremarkable in appearance. No axillary lymphadenopathy. Lungs/Pleura: Widespread areas of severe ground-glass attenuation are noted scattered throughout the lungs bilaterally. No pleural effusions. Mild diffuse bronchial wall thickening with moderate centrilobular and paraseptal emphysema. Upper Abdomen: Unremarkable. Musculoskeletal: There are no aggressive appearing lytic or blastic lesions noted in the visualized portions of the skeleton. Review of the MIP images confirms the above findings. IMPRESSION: 1. Small nonocclusive pulmonary emboli in the lungs bilaterally, as above. 2. The appearance of the lungs is highly concerning for severe multilobar bilateral pneumonia, likely from atypical viral etiologies such as COVID infection. 3. Diffuse bronchial wall thickening with moderate centrilobular and paraseptal emphysema; imaging findings suggestive of underlying COPD. These results will be called to the ordering clinician or representative by the Radiologist Assistant, and communication documented in the PACS or [employer] Emphysema (ICD10-J43.9). Electronically  Signed   By: Vinnie Langton M.D.   On: 10/28/2019 09:37   US Abdomen Limited  Result Date: 10/29/2019 CLINICAL DATA:  Elevated LFTs EXAM: ULTRASOUND ABDOMEN LIMITED RIGHT UPPER QUADRANT COMPARISON:  January 11, 2019. FINDINGS: Gallbladder: Status post cholecystectomy. Common bile duct: Diameter: 2 mm, normal Liver: No focal lesion identified. Mildly heterogeneous liver echotexture. Portal vein is patent on color Doppler imaging with normal direction of blood flow towards the liver. Other: None. IMPRESSION: Mildly heterogeneous liver echotexture without focal lesion identified. Electronically Signed   By: Valentino Saxon MD   On: 10/29/2019 09:36   US Venous Img Lower Bilateral (DVT)  Result Date: 10/28/2019 CLINICAL DATA:  Acute respiratory failure. Small volume bilateral pulmonary embolism. Evaluate for DVT. EXAM: BILATERAL LOWER EXTREMITY VENOUS DOPPLER ULTRASOUND TECHNIQUE: Gray-scale sonography with graded compression, as well as color Doppler and duplex ultrasound were performed to evaluate the lower extremity deep venous systems from the level of the common femoral vein and including the common femoral, femoral, profunda femoral, popliteal and calf veins including the posterior tibial, peroneal and gastrocnemius veins when visible. The superficial great saphenous vein was also interrogated. Spectral Doppler was utilized to evaluate flow at rest and with distal augmentation maneuvers in the common femoral, femoral and popliteal veins. COMPARISON:  None. FINDINGS: RIGHT LOWER EXTREMITY Common Femoral Vein: No evidence of thrombus. Normal compressibility, respiratory phasicity and response to augmentation. Saphenofemoral Junction: No evidence of thrombus. Normal compressibility and flow on color Doppler imaging. Profunda Femoral Vein: No evidence of thrombus. Normal compressibility and flow on color Doppler imaging. Femoral Vein: No evidence of  thrombus. Normal compressibility, respiratory phasicity and response to augmentation. Popliteal Vein: No evidence of thrombus. Normal compressibility, respiratory phasicity and response to augmentation. Calf Veins: No evidence of thrombus. Normal compressibility and flow on color Doppler imaging. Superficial Great Saphenous Vein: No evidence of thrombus. Normal compressibility. Venous Reflux:  None. Other Findings:  None. LEFT LOWER EXTREMITY Common Femoral Vein: No evidence of thrombus. Normal compressibility, respiratory phasicity and response to augmentation. Saphenofemoral Junction: No evidence of thrombus. Normal compressibility and flow on color Doppler imaging. Profunda Femoral Vein: No evidence of thrombus. Normal compressibility and flow on color Doppler imaging. Femoral Vein: No evidence of thrombus. Normal compressibility, respiratory phasicity and response to augmentation. Popliteal Vein: No evidence of thrombus. Normal compressibility, respiratory phasicity and response to augmentation. Calf Veins: No evidence of thrombus. Normal compressibility and flow on color Doppler imaging. Superficial Great Saphenous Vein: No evidence of thrombus. Normal compressibility. Venous Reflux:  None. Other Findings:  None. IMPRESSION: No evidence of DVT within either lower extremity. Electronically Signed   By: Sandi Mariscal M.D.   On: 10/28/2019 16:50   DG CHEST PORT 1 VIEW  Result Date: 10/31/2019 CLINICAL DATA:  Evaluate OG tube placement EXAM: PORTABLE CHEST 1 VIEW COMPARISON:  October 31, 2019 FINDINGS: The OG tube terminates in the left side of the abdomen, within the stomach. Stable cardiomegaly. The hila and mediastinum are unchanged. Bilateral pulmonary opacities in the bases are similar to mildly worsened in the interval. No other changes. IMPRESSION: 1. The OG tube terminates in the stomach. 2. Bibasilar infiltrates remain, stable to mildly worsened. 3. The ET tube is been removed in the interval.  Electronically Signed   By: Dorise Bullion III M.D   On: 10/31/2019 16:10   DG CHEST PORT 1 VIEW  Result Date: 10/31/2019 CLINICAL DATA:  Pt. On a vent.   Acute hypoxic respiratory failure. EXAM: PORTABLE CHEST 1  VIEW COMPARISON:  Chest radiograph 10/29/2019 FINDINGS: Stable support apparatus. Unchanged cardiomediastinal contours. There is increased consolidation at the left lung base. Persistent airspace opacities in the right mid to lower lung. No pneumothorax. No definite effusion. No acute osseous finding. IMPRESSION: 1. Increasing consolidation at the left lung base. 2. Persistent airspace opacities in the right mid to lower lung. Electronically Signed   By: Audie Pinto M.D.   On: 10/31/2019 13:27   DG CHEST PORT 1 VIEW  Result Date: 10/29/2019 CLINICAL DATA:  34 year old female with respiratory failure hypoxia. EXAM: PORTABLE CHEST 1 VIEW COMPARISON:  Chest radiograph dated 10/28/2019 and CT dated 10/28/2019. FINDINGS: Endotracheal tube above the carina and enteric tube extends below the diaphragm with tip the on the inferior margin of the image and side-port in the region of the stomach. Bilateral mid to lower lung field airspace opacities similar to prior radiograph. No large pleural effusion. No pneumothorax. Mild cardiomegaly. No acute osseous pathology. IMPRESSION: 1. No significant interval change in bilateral mid to lower lung field airspace opacities. 2. Stable support apparatus. Electronically Signed   By: Anner Crete M.D.   On: 10/29/2019 03:46   DG Chest Portable 1 View  Result Date: 10/28/2019 CLINICAL DATA:  Status post intubation today. EXAM: PORTABLE CHEST 1 VIEW COMPARISON:  Single-view of the chest 10/27/2019. FINDINGS: New NG tube is in place in good position with the side-port in the stomach. Endotracheal tube is also in good position with the tip just below the clavicular heads. Right worse than left airspace disease has progressed since yesterday's exam. No  pneumothorax or pleural fluid. Heart size normal. IMPRESSION: ETT and NG tube in good position. Worsened airspace disease consistent with progressive pneumonia. Electronically Signed   By: Inge Rise M.D.   On: 10/28/2019 11:42   DG Chest Port 1 View  Result Date: 10/27/2019 CLINICAL DATA:  Shortness of breath. EXAM: PORTABLE CHEST 1 VIEW COMPARISON:  January 09, 2015. FINDINGS: The heart size and mediastinal contours are within normal limits. No pneumothorax or pleural effusion is noted. Multiple ill-defined airspace opacities are noted bilaterally concerning for multifocal pneumonia. The visualized skeletal structures are unremarkable. IMPRESSION: Bilateral multifocal pneumonia. Electronically Signed   By: Marijo Conception M.D.   On: 10/27/2019 14:27   ECHOCARDIOGRAM COMPLETE  Result Date: 10/28/2019    ECHOCARDIOGRAM REPORT   Patient Name:   LARIAH FLEER Date of Exam: 10/28/2019 Medical Rec #:  [id number]       Height:       64.0 in Accession #:    [id number]      Weight:       120.0 lb Date of Birth:  1985-06-17        BSA:          1.575 m Patient Age:    12 years        BP:           130/94 mmHg Patient Gender: F               HR:           116 bpm. Exam Location:  [facility] Procedure: 2D Echo, Cardiac Doppler and Color Doppler Indications:    Pulmonary Embolus 415.19 / I26.99  History:        Patient has no prior history of Echocardiogram examinations.                 Multifocal pneumonia, Acute respiratory failure with hypoxia.  Sonographer:  Alvino Chapel RCS Referring Phys: [phone] Dangelo Guzzetta IMPRESSIONS  1. Left ventricular ejection fraction, by estimation, is 55 to 60%. The left ventricle has normal function. The left ventricle has no regional wall motion abnormalities. Left ventricular diastolic parameters are indeterminate.  2. Right ventricular systolic function is moderately reduced. The right ventricular size is severely enlarged. There is moderately elevated pulmonary artery  systolic pressure.  3. The mitral valve is normal in structure. Mild mitral valve regurgitation. No evidence of mitral stenosis.  4. The aortic valve has an indeterminant number of cusps. Aortic valve regurgitation is not visualized. No aortic stenosis is present. FINDINGS  Left Ventricle: Left ventricular ejection fraction, by estimation, is 55 to 60%. The left ventricle has normal function. The left ventricle has no regional wall motion abnormalities. The left ventricular internal cavity size was normal in size. There is  no left ventricular hypertrophy. Left ventricular diastolic parameters are indeterminate. Right Ventricle: There is septal flattening in systole. The right ventricular size is severely enlarged. No increase in right ventricular wall thickness. Right ventricular systolic function is moderately reduced. There is moderately elevated pulmonary artery systolic pressure. The tricuspid regurgitant velocity is 3.23 m/s, and with an assumed right atrial pressure of 10 mmHg, the estimated right ventricular systolic pressure is 82.9 mmHg. Left Atrium: Left atrial size was normal in size. Right Atrium: Right atrial size was normal in size. Pericardium: There is no evidence of pericardial effusion. Mitral Valve: The mitral valve is normal in structure. Mild mitral valve regurgitation. No evidence of mitral valve stenosis. Tricuspid Valve: The tricuspid valve is normal in structure. Tricuspid valve regurgitation is mild . No evidence of tricuspid stenosis. Aortic Valve: The aortic valve has an indeterminant number of cusps. Aortic valve regurgitation is not visualized. No aortic stenosis is present. Aortic valve mean gradient measures 2.7 mmHg. Aortic valve peak gradient measures 4.9 mmHg. Aortic valve area, by VTI measures 1.50 cm. Pulmonic Valve: The pulmonic valve was not well visualized. Pulmonic valve regurgitation is not visualized. No evidence of pulmonic stenosis. Aorta: The aortic root is normal in  size and structure. Venous: IVC assessment for right atrial pressure unable to be performed due to mechanical ventilation. IAS/Shunts: No atrial level shunt detected by color flow Doppler.  LEFT VENTRICLE PLAX 2D LVIDd:         3.94 cm  Diastology LVIDs:         2.98 cm  LV e' lateral:   19.90 cm/s LV PW:         0.80 cm  LV E/e' lateral: 4.6 LV IVS:        0.83 cm  LV e' medial:    14.40 cm/s LVOT diam:     1.70 cm  LV E/e' medial:  6.3 LV SV:         27 LV SV Index:   17 LVOT Area:     2.27 cm  RIGHT VENTRICLE RV S prime:     15.70 cm/s TAPSE (M-mode): 2.2 cm LEFT ATRIUM           Index       RIGHT ATRIUM           Index LA diam:      2.20 cm 1.40 cm/m  RA Area:     17.40 cm LA Vol (A4C): 34.9 ml 22.16 ml/m RA Volume:   48.50 ml  30.80 ml/m  AORTIC VALVE AV Area (Vmax):    1.78 cm AV Area (Vmean):  1.50 cm AV Area (VTI):     1.50 cm AV Vmax:           110.51 cm/s AV Vmean:          78.791 cm/s AV VTI:            0.178 m AV Peak Grad:      4.9 mmHg AV Mean Grad:      2.7 mmHg LVOT Vmax:         86.50 cm/s LVOT Vmean:        52.200 cm/s LVOT VTI:          0.117 m LVOT/AV VTI ratio: 0.66  AORTA Ao Root diam: 3.10 cm MITRAL VALVE               TRICUSPID VALVE MV Area (PHT): 6.96 cm    TR Peak grad:   41.7 mmHg MV Decel Time: 109 msec    TR Vmax:        323.00 cm/s MV E velocity: 90.90 cm/s                            SHUNTS                            Systemic VTI:  0.12 m                            Systemic Diam: 1.70 cm Carlyle Dolly MD Electronically signed by Carlyle Dolly MD Signature Date/Time: 10/28/2019/3:31:55 PM    Final    [place] EKG SITE RITE  Result Date: 11/01/2019 If Site Rite image not attached, placement could not be confirmed due to current cardiac rhythm.    CBC Recent Labs  Lab 10/27/19 1438 10/28/19 0331 10/29/19 1157 10/29/19 1157 10/29/19 1337 10/30/19 0354 10/30/19 1331 10/31/19 0419 11/01/19 0623  WBC 13.8*   < > 16.4*  --   --  10.4 10.1 7.7 9.4  HGB 10.3*   < >  8.8*  --   --  7.8* 7.5* 7.8* 7.8*  HCT 31.3*   < > 27.4*  --   --  24.0* 24.1* 25.3* 24.9*  PLT 217   < > 37*   < > 36* 38* 45* 62* 119*  MCV 83.5   < > 84.6  --   --  84.5 87.0 87.8 88.6  MCH 27.5   < > 27.2  --   --  27.5 27.1 27.1 27.8  MCHC 32.9   < > 32.1  --   --  32.5 31.1 30.8 31.3  RDW 19.7*   < > 21.1*  --   --  20.4* 20.3* 19.9* 20.3*  LYMPHSABS 0.7  --   --   --   --   --   --   --   --   MONOABS 0.3  --   --   --   --   --   --   --   --   EOSABS 0.1  --   --   --   --   --   --   --   --   BASOSABS 0.1  --   --   --   --   --   --   --   --    < > = values in this interval not  displayed.   Chemistries  Recent Labs  Lab 10/27/19 1438 10/27/19 1438 10/28/19 0331 10/28/19 0331 10/28/19 0941 10/28/19 0941 10/29/19 0408 10/30/19 0354 10/30/19 1139 10/30/19 1331 10/31/19 0419 11/01/19 0623  NA 135   < > 138   < > 137   < > 138 140  --  141 141 143  K 2.7*   < > 4.0   < > 4.1   < > 3.9 3.4*  --  3.1* 3.8 2.8*  CL 106   < > 112*   < > 112*   < > 113* 111  --  112* 109 107  CO2 16*   < > 15*   < > 15*   < > 17* 23  --  23 24 25   GLUCOSE 96   < > 125*   < > 121*   < > 150* 124*  --  101* 110* 106*  BUN 19   < > 18   < > 18   < > 24* 11  --  9 10 13   CREATININE 0.89   < > 0.66   < > 0.65   < > 0.71 0.50  --  0.53 0.42* 0.37*  CALCIUM 8.0*   < > 7.7*   < > 7.8*   < > 7.7* 7.7*  --  7.5* 7.6* 7.6*  MG 2.0  --  2.6*  --   --   --   --   --  1.9  --   --   --   AST 26   < > 26  --  22  --  123* 32  --  34  --   --   ALT 12   < > 16  --  15  --  59* 43  --  43  --   --   ALKPHOS 127*   < > 162*  --  150*  --  130* 97  --  92  --   --   BILITOT 0.5   < > 0.8  --  0.8  --  0.6 0.4  --  0.6  --   --    < > = values in this interval not displayed.   ------------------------------------------------------------------------------------------------------------------ Recent Labs    10/31/19 0419 11/01/19 0623  TRIG 110 171*    No results found for:  HGBA1C ------------------------------------------------------------------------------------------------------------------ No results for input(s): TSH, T4TOTAL, T3FREE, THYROIDAB in the last 72 hours.  Invalid input(s): FREET3 ------------------------------------------------------------------------------------------------------------------ Recent Labs    10/29/19 1905  RETICCTPCT 1.5    Coagulation profile Recent Labs  Lab 10/29/19 1337 10/30/19 0354 10/30/19 1331 10/31/19 0419 11/01/19 0623  INR 1.5* 1.4* 1.4* 1.3* 1.2    Recent Labs    10/29/19 1337  DDIMER >20.00*    Cardiac Enzymes No results for input(s): CKMB, TROPONINI, MYOGLOBIN in the last 168 hours.  Invalid input(s): CK ------------------------------------------------------------------------------------------------------------------    Component Value Date/Time   BNP 271.0 (H) 10/27/2019 1438   Roxan Hockey M.D on 11/01/2019 at 11:26 AM  Go to [email or web address] - for contact info  [facility] - Office  [phone]

## 2019-11-01 NOTE — Progress Notes (Signed)
Brother, Carver Fila, called back to consent for the PICC line. Understood all risks/benefits.

## 2019-11-01 NOTE — Progress Notes (Signed)
[facility] at bedside to place PICC. Patient unable to sign consent for PICC line placement. Called mother to get consent twice with no answer, voicemail left for her to call back. Continued waiting for call back. Called brother to gain consent since mother did not call back and brother did not answer, voicemail was left. Due to time constraints, vascular wellness was unable to continue waiting for consent so consent was given by Dr. Jayme Cloud because of the medical necessity. Patient is a hard stick, has had multiple sticks for IVs, as well as lab draws.

## 2019-11-01 NOTE — Progress Notes (Signed)
ANTICOAGULATION CONSULT NOTE   Pharmacy Consult for Heparin  Indication: pulmonary embolus  Allergies  Allergen Reactions  . Bactrim [Sulfamethoxazole-Trimethoprim] Nausea And Vomiting  . Ciprofloxacin Nausea And Vomiting  . Darvocet [Propoxyphene N-Acetaminophen] Hives  . Other     darvocet  . Penicillins Itching  . Penicillins Hives  . Bactrim [Sulfamethoxazole-Trimethoprim] Rash  . Ciprofloxacin Rash    Patient Measurements: Height: 5\' 8"  (172.7 cm) Weight: 66.5 kg (146 lb 9.7 oz) IBW/kg (Calculated) : 63.9 Heparin Dosing Weight: HEPARIN DW (KG): 54.4   Vital Signs: Temp: 98.7 F (37.1 C) (08/29 0300) Temp Source: Oral (08/29 0300) BP: 127/71 (08/29 1245) Pulse Rate: 81 (08/29 1245)  Labs: Recent Labs    10/29/19 1337 10/29/19 1337 10/30/19 0354 10/30/19 0354 10/30/19 1331 10/30/19 1331 10/31/19 0419 10/31/19 1644 11/01/19 0044 11/01/19 0623 11/01/19 1254  HGB  --   --  7.8*   < > 7.5*   < > 7.8*  --   --  7.8*  --   HCT  --   --  24.0*   < > 24.1*  --  25.3*  --   --  24.9*  --   PLT 36*   < > 38*   < > 45*  --  62*  --   --  119*  --   APTT 52*  --  28  --   --   --   --   --   --   --   --   LABPROT 17.6*   < > 16.4*   < > 16.8*  --  15.4*  --   --  15.1  --   INR 1.5*   < > 1.4*   < > 1.4*  --  1.3*  --   --  1.2  --   HEPARINUNFRC  --   --   --   --   --   --   --  <0.10* 0.13*  --  0.48  CREATININE  --   --  0.50   < > 0.53  --  0.42*  --   --  0.37*  --    < > = values in this interval not displayed.    Estimated Creatinine Clearance: 100 mL/min (A) (by C-G formula based on SCr of 0.37 mg/dL (L)).  Assessment: Diane Norton a 34 y.o. female requires anticoagulation with a heparin iv infusion for the indication of  pulmonary embolus. Heparin gtt will be started following pharmacy protocol per pharmacy consult.   Improving platelets, fibrinogen, and DIC- restart heparin per Dr. 20.  Heparin level 0.48- therapeutic   Goal of Therapy:   Heparin level 0.3-0.7 units/ml Monitor platelets by anticoagulation protocol: Yes   Plan:  Continue heparin infusion at 1500 units/hr Re-check heparin level in 6-8 hours Continue to monitor H&H and s/s of bleeding.  [name], PharmD Clinical Pharmacist 11/01/2019 1:14 PM

## 2019-11-01 NOTE — Progress Notes (Signed)
ANTICOAGULATION CONSULT NOTE   Pharmacy Consult for Heparin  Indication: pulmonary embolus  Allergies  Allergen Reactions  . Bactrim [Sulfamethoxazole-Trimethoprim] Nausea And Vomiting  . Ciprofloxacin Nausea And Vomiting  . Darvocet [Propoxyphene N-Acetaminophen] Hives  . Other     darvocet  . Penicillins Itching  . Penicillins Hives  . Bactrim [Sulfamethoxazole-Trimethoprim] Rash  . Ciprofloxacin Rash    Patient Measurements: Height: 5\' 8"  (172.7 cm) Weight: 66.5 kg (146 lb 9.7 oz) IBW/kg (Calculated) : 63.9 Heparin Dosing Weight: HEPARIN DW (KG): 54.4   Vital Signs: Temp: 98.7 F (37.1 C) (08/29 0300) Temp Source: Oral (08/29 0300) BP: 104/53 (08/29 0330) Pulse Rate: 61 (08/29 0330)  Labs: Recent Labs    10/29/19 1157 10/29/19 1157 10/29/19 1337 10/29/19 1337 10/30/19 0354 10/30/19 0354 10/30/19 1331 10/31/19 0419 10/31/19 1644 11/01/19 0044  HGB 8.8*  --   --    < > 7.8*   < > 7.5* 7.8*  --   --   HCT 27.4*  --   --    < > 24.0*  --  24.1* 25.3*  --   --   PLT 37*   < > 36*   < > 38*  --  45* 62*  --   --   APTT  --   --  52*  --  28  --   --   --   --   --   LABPROT  --   --  17.6*   < > 16.4*  --  16.8* 15.4*  --   --   INR  --   --  1.5*   < > 1.4*  --  1.4* 1.3*  --   --   HEPARINUNFRC 0.11*  --   --   --   --   --   --   --  <0.10* 0.13*  CREATININE  --   --   --   --  0.50  --  0.53 0.42*  --   --    < > = values in this interval not displayed.    Estimated Creatinine Clearance: 100 mL/min (A) (by C-G formula based on SCr of 0.42 mg/dL (L)).  Assessment: Diane Norton a 34 y.o. female requires anticoagulation with a heparin iv infusion for the indication of  pulmonary embolus. Heparin gtt will be started following pharmacy protocol per pharmacy consult.   Improving platelets, fibrinogen, and DIC- restart heparin per Dr. 20.  Heparin level <0.10- no bolus per MD  8/29 AM update:  Heparin level low but trending up No issues per  RN  Goal of Therapy:  Heparin level 0.3-0.7 units/ml Monitor platelets by anticoagulation protocol: Yes   Plan:  Will avoid bolus with low platelets/Hgb Inc heparin to 1500 units/hr Re-check heparin level in 6-8 hours  9/29, PharmD, BCPS Clinical Pharmacist Phone: [phone]

## 2019-11-01 NOTE — Progress Notes (Signed)
[facility] notified of hypotension awaiting orders/response.

## 2019-11-02 ENCOUNTER — Inpatient Hospital Stay (HOSPITAL_COMMUNITY): Payer: Medicaid Other

## 2019-11-02 LAB — CBC
HCT: 24.7 % — ABNORMAL LOW (ref 36.0–46.0)
Hemoglobin: 8.1 g/dL — ABNORMAL LOW (ref 12.0–15.0)
MCH: 29.1 pg (ref 26.0–34.0)
MCHC: 32.8 g/dL (ref 30.0–36.0)
MCV: 88.8 fL (ref 80.0–100.0)
Platelets: 223 10*3/uL (ref 150–400)
RBC: 2.78 MIL/uL — ABNORMAL LOW (ref 3.87–5.11)
RDW: 21.2 % — ABNORMAL HIGH (ref 11.5–15.5)
WBC: 10.6 10*3/uL — ABNORMAL HIGH (ref 4.0–10.5)
nRBC: 0.6 % — ABNORMAL HIGH (ref 0.0–0.2)

## 2019-11-02 LAB — TRIGLYCERIDES: Triglycerides: 926 mg/dL — ABNORMAL HIGH (ref <150)

## 2019-11-02 LAB — MAGNESIUM: Magnesium: 1.6 mg/dL — ABNORMAL LOW (ref 1.7–2.4)

## 2019-11-02 LAB — BASIC METABOLIC PANEL
Anion gap: 9 (ref 5–15)
BUN: 13 mg/dL (ref 6–20)
CO2: 27 mmol/L (ref 22–32)
Calcium: 7.2 mg/dL — ABNORMAL LOW (ref 8.9–10.3)
Chloride: 104 mmol/L (ref 98–111)
Creatinine, Ser: 0.35 mg/dL — ABNORMAL LOW (ref 0.44–1.00)
GFR calc Af Amer: >60 mL/min (ref >60)
GFR calc non Af Amer: >60 mL/min (ref >60)
Glucose, Bld: 96 mg/dL (ref 70–99)
Potassium: 2.7 mmol/L — CL (ref 3.5–5.1)
Sodium: 140 mmol/L (ref 135–145)

## 2019-11-02 LAB — GLUCOSE, CAPILLARY
Glucose-Capillary: 100 mg/dL — ABNORMAL HIGH (ref 70–99)
Glucose-Capillary: 101 mg/dL — ABNORMAL HIGH (ref 70–99)
Glucose-Capillary: 109 mg/dL — ABNORMAL HIGH (ref 70–99)
Glucose-Capillary: 110 mg/dL — ABNORMAL HIGH (ref 70–99)
Glucose-Capillary: 150 mg/dL — ABNORMAL HIGH (ref 70–99)
Glucose-Capillary: 99 mg/dL (ref 70–99)

## 2019-11-02 LAB — HEPARIN LEVEL (UNFRACTIONATED)
Heparin Unfractionated: 0.26 IU/mL — ABNORMAL LOW (ref 0.30–0.70)
Heparin Unfractionated: 0.46 IU/mL (ref 0.30–0.70)

## 2019-11-02 LAB — METHYLMALONIC ACID, SERUM: Methylmalonic Acid, Quantitative: 106 nmol/L (ref 0–378)

## 2019-11-02 LAB — FIBRINOGEN: Fibrinogen: 349 mg/dL (ref 210–475)

## 2019-11-02 LAB — PROTIME-INR
INR: 1.1 (ref 0.8–1.2)
Prothrombin Time: 13.8 seconds (ref 11.4–15.2)

## 2019-11-02 LAB — APTT: aPTT: 65 seconds — ABNORMAL HIGH (ref 24–36)

## 2019-11-02 LAB — LACTATE DEHYDROGENASE: LDH: 380 U/L — ABNORMAL HIGH (ref 98–192)

## 2019-11-02 MED ORDER — DEXMEDETOMIDINE HCL IN NACL 400 MCG/100ML IV SOLN
0.4000 ug/kg/h | INTRAVENOUS | Status: DC
  Administered 2019-11-02 – 2019-11-03 (×2): 0.4 ug/kg/h via INTRAVENOUS
  Administered 2019-11-03: 1 ug/kg/h via INTRAVENOUS
  Administered 2019-11-03: 0.8 ug/kg/h via INTRAVENOUS
  Administered 2019-11-03 – 2019-11-04 (×2): 1 ug/kg/h via INTRAVENOUS
  Administered 2019-11-04: 1.1 ug/kg/h via INTRAVENOUS
  Filled 2019-11-02 (×6): qty 100

## 2019-11-02 MED ORDER — POTASSIUM CHLORIDE 20 MEQ PO PACK
40.0000 meq | PACK | Freq: Once | ORAL | Status: AC
  Administered 2019-11-02: 40 meq via ORAL
  Filled 2019-11-02: qty 2

## 2019-11-02 MED ORDER — POTASSIUM CHLORIDE 10 MEQ/100ML IV SOLN
10.0000 meq | INTRAVENOUS | Status: AC
  Administered 2019-11-02 (×4): 10 meq via INTRAVENOUS
  Filled 2019-11-02 (×4): qty 100

## 2019-11-02 MED ORDER — MIDAZOLAM HCL 2 MG/2ML IJ SOLN
1.0000 mg | INTRAMUSCULAR | Status: DC | PRN
  Administered 2019-11-02: 2 mg via INTRAVENOUS
  Filled 2019-11-02: qty 2

## 2019-11-02 MED ORDER — MAGNESIUM SULFATE 2 GM/50ML IV SOLN
2.0000 g | Freq: Once | INTRAVENOUS | Status: AC
  Administered 2019-11-02: 2 g via INTRAVENOUS
  Filled 2019-11-02: qty 50

## 2019-11-02 MED ORDER — PROPOFOL 1000 MG/100ML IV EMUL
5.0000 ug/kg/min | INTRAVENOUS | Status: DC
  Administered 2019-11-02 – 2019-11-03 (×3): 30 ug/kg/min via INTRAVENOUS
  Administered 2019-11-03: 40 ug/kg/min via INTRAVENOUS
  Filled 2019-11-02 (×4): qty 100

## 2019-11-02 MED ORDER — FENOFIBRATE 160 MG PO TABS
160.0000 mg | ORAL_TABLET | Freq: Every day | ORAL | Status: DC
  Administered 2019-11-02: 160 mg via ORAL
  Filled 2019-11-02 (×3): qty 1

## 2019-11-02 MED ORDER — DEXMEDETOMIDINE HCL IN NACL 400 MCG/100ML IV SOLN
0.4000 ug/kg/h | INTRAVENOUS | Status: DC
  Administered 2019-11-02: 0.4 ug/kg/h via INTRAVENOUS
  Filled 2019-11-02: qty 100

## 2019-11-02 MED ORDER — ATORVASTATIN CALCIUM 40 MG PO TABS
40.0000 mg | ORAL_TABLET | Freq: Every day | ORAL | Status: DC
  Administered 2019-11-02: 40 mg via ORAL
  Filled 2019-11-02: qty 1

## 2019-11-02 NOTE — Progress Notes (Signed)
Patient Demographics:    Eusebia Grulke, is a 34 y.o. female, DOB - 24-Oct-1985, ZOX:[id number]  Admit date - 10/27/2019   Admitting Physician Kataleyah Carducci Denton Brick, MD  Outpatient Primary MD for the patient is Patient, No Pcp Per  LOS - 6   Chief Complaint  Patient presents with   Shortness of Breath        Subjective:    Ajanee Buren remains intubated and sedated  -Triglycerides are rising -Attempted to switch patient from propofol to Precedex--due to rising triglycerides -Patient became very very agitated and tachypneic with heart rate in the high 40s--despite Precedex and Dilaudid -Unable to wean patient off vent -Attempt at weaning and sedation vacation aborted due to significant agitation and tachypnea with physical respiratory distress for patient -Patient back on propofol, will monitor triglycerides and attempt sedation vacation and weaning again on 11/03/2019    Assessment  & Plan :    Principal Problem:   Acute Hypoxic respiratory failure ([place]) Active Problems:   DIC (disseminated intravascular coagulation) ([place])   Acute pulmonary embolism (H[facility])   Asthma   Multifocal pneumonia   History of seizures   PNA (pneumonia)   Thrombocytopenia ([place])  Brief summary 34 year old Caucasian female with past medical history relevant for ongoing heroin abuse, 2 pack-a-day smoker with COPD/emphysema, history of underlying asthma, history of seizures with no recent seizure episodes and 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 admitted on 10/27/2019 with hypoxic respiratory failure secondary to combination of to bilateral nonocclusive PE, bilateral multifocal pneumonia and COPD exacerbation--- hypoxic respiratory failure worsened, patient was confused and not compliant with oxygen therapy, intubated 10/28/2019 -Currently on IV propofol and IV dilaudid drips --- ADAMTs 13 panel is 59.9  %--- which would be inconsistent with TTP, this is more consistent with DIC   A/p 1)Acute hypoxic respiratory failure----secondary to bilateral pneumonia, COPD/emphysema flareup and bilateral nonocclusive PE---- -remains intubated and sedated --Respiratory failure and hypoxia worsened, discussed with EDP Dr. Reather Converse, decision made to intubate on 10/28/19 -completed COVID-19 vaccination [employer]) in  April 2021 -COVID-19 test negative x 3 times  this admission -Please see subjective section above  2) bilateral multifocal pneumonia---- intubated for hypoxic respiratory failure as above #1 --Bronchodilators, -Strep pneumo negative, -Legionella neg PCT 2.08 >>0.81>>4.68---  ---stopped azithromycin and Rocephin on 10/29/2019  --started on meropenem and doxycycline on 10/29/19  3) acute COPD/emphysema exacerbation--- secondary to #2 above, contributing to #1 above --Patient is a 2 pack-a-day smoker -IV Solu-Medrol and bronchodilators as ordered  4) heroin addiction--- patient uses heroin on admission at least $50 worth of heroin on a daily basis, she does not inject apparently she snorts heroin -As needed lorazepam and continuous propofol drip as ordered -Currently on IV dilaudid drip  5)Bilateral nonocclusive pulmonary embolism--- patient received therapeutic dose of Lovenox on 10/27/2019 --Started IV heparin drip on 10/28/2019, discontinued IV heparin on 10/29/2019 due to worsening thrombocytopenia -- given improving fibrinogen, platelet count and improving DIC  profile  -- restarted IV heparin for PE without bolus on 10/31/2019 -Echo with EF of 55 to 60%, with severe right ventricular enlargement and moderate pulmonary hypertension -Lower extremity venous Dopplers negative  6)Social/Ethics---Spoke with Brother --- Bronson Ing at [phone] -- Brother unable to visit as he was  dxed with covid PNA on 10/10/19 and he was just dced from hospital last week  Pt's brother stated that Malyia was not around  him recently (not exposed to him while he had covid) --  Spoke with Mother---[name] [phone] Updated Both brother and mother----  17) history of seizures--no recent seizures, currently intubated and sedated with propofol along with as needed lorazepam  8) acute on chronic anemia and acute thrombocytopenia/DIC--- on admission on 10/27/2019 platelet count was 217, patient received Lovenox 80 mg subcu x1 on 10/27/2019 -Patient was started on IV heparin for PE on 10/28/2019 in a.m. -patient received therapeutic dose of Lovenox on 10/27/2019 --Started IV heparin drip on 10/28/2019 in am , discontinued IV heparin on 10/29/2019 due to worsening thrombocytopenia -- given improving fibrinogen, platelet count and improving DIC  profile  -- restarted IV heparin for PE without bolus on 10/31/2019 Platelets 217>>142>>116>>39>>37>>38>>45>>61>>119>>223 Hgb 10.8 >>> 8.8 >>7.8>>7.5>.7.8 LDH 664>>594>>532>> 523>>421>>380 Fibrinogen  594>>79>>127>>>175>>213>>349 inr 1.5>>>1.4>>1.3>>1.2>>1.1 -- hematology consult from Dr. Delton Coombes appreciated -No obvious bleeding at this time ----HIT and DIC panel results reviewed with Dr. Delton Coombes --DIC is more likely than HIT given the clinical presentation and lab findings -He advised stopping IV heparin, he advised against argatroban -- we gave FFP and cryoprecipitate 10/29/19 to reduce bleeding risk -Vitamin K given on 10/30/2019 --Since DIC may be triggered by antibiotics/medications--we will stop azithromycin and Rocephin which was started on admission and instead use meropenem and doxycycline -Patient has multiple allergies limiting choices of antibiotics -- ADAMTs 13 panel is 59.9 --- which would be inconsistent with TTP, this is more consistent with DIC --- I called [facility] hematology transfer [facility]----[facility] declined to accept patient for transfer due to lack of beds -Overall DIC pathophysiology appears to be improving/resolving at this  time  9)Prophylaxis--Protonix for GI prophylaxis and , IV heparin for PE  10)FEN/hypokalemia/hypomagnesemia and hypophosphatemia--replace potassium, magnesium and phosphorous ---dietitian consult for tube feeding  -Tolerating vital 1.2 tube feeds at 40 mL an hour  CRITICAL CARE Performed by: Roxan Hockey   Total critical care time: 42 minutes  Critical care time was exclusive of separately billable procedures and treating other patients.  -acute hypoxic respiratory failure requiring intubation, sedation, IV antibiotics   Vent settings:- PRVC/35%/5/24/540 -A lot more calm on IV propofol and IV Dilaudid drips  --Triglycerides are rising -Attempted to switch patient from propofol to Precedex--due to rising triglycerides -Patient became very very agitated and tachypneic with heart rate in the high 40s--despite Precedex and Dilaudid -Unable to wean patient off vent -Attempt at weaning and sedation vacation aborted due to significant agitation and tachypnea with physical respiratory distress for patient -Patient back on propofol, will monitor triglycerides and attempt sedation vacation and weaning again on 11/03/2019  Critical care was necessary to treat or prevent imminent or life-threatening deterioration.  Critical care was time spent personally by me on the following activities: development of treatment plan with patient and/or surrogate as well as nursing, discussions with consultants, evaluation of patient's response to treatment, examination of patient, obtaining history from patient or surrogate, ordering and performing treatments and interventions, ordering and review of laboratory studies, ordering and review of radiographic studies, pulse oximetry and re-evaluation of patient's condition.   Disposition/Need for in-Hospital Stay- patient unable to be discharged at this time due to --- -acute hypoxic respiratory failure requiring intubation/mechanical ventilation and, sedation,  IV antibiotics with plan for DIC, and pulmonary embolism requiring IV heparin  Status is: Inpatient  Remains inpatient appropriate because:acute  hypoxic respiratory failure requiring intubation/mechanical ventilation and, sedation, IV antibiotics with plan for DIC, and pulmonary embolism requiring IV heparin   Disposition: The patient is from: Home              Anticipated d/c is to: TBD              Anticipated d/c date is: > 3 days              Patient currently is not medically stable to d/c. Barriers: Not Clinically Stable-  -acute hypoxic respiratory failure requiring intubation/mechanical ventilation and, sedation, IV antibiotics with plan for DIC, and pulmonary embolism requiring IV heparin  Code Status : Full  Family Communication:   -  D/w patient's mother [name]  -and pt Brother Jaelen Gellerman at [phone]  Consults  :  PCCM/hematology  DVT Prophylaxis  : IV heparin  Lab Results  Component Value Date   PLT 223 11/02/2019    Inpatient Medications  Scheduled Meds:  atorvastatin  40 mg Oral Daily   chlorhexidine gluconate (MEDLINE KIT)  15 mL Mouth Rinse BID   Chlorhexidine Gluconate Cloth  6 each Topical Daily   cloNIDine  0.1 mg Per Tube TID   docusate  100 mg Per Tube BID   feeding supplement (PROSource TF)  45 mL Per Tube TID   feeding supplement (VITAL AF 1.2 CAL)  1,000 mL Per Tube Q24H   fenofibrate  160 mg Oral Daily   folic acid  1 mg Oral Daily   ipratropium-albuterol  3 mL Nebulization Q4H   mouth rinse  15 mL Mouth Rinse 10 times per day   methylPREDNISolone (SOLU-MEDROL) injection  40 mg Intravenous Daily   multivitamin with minerals  1 tablet Oral Daily   pantoprazole sodium  40 mg Per Tube Q1200   rocuronium  50 mg Intravenous Once   sodium chloride flush  3 mL Intravenous Q12H   thiamine  100 mg Oral Daily   Or   thiamine  100 mg Intravenous Daily   Continuous Infusions:  sodium chloride     dexmedetomidine  (PRECEDEX) IV infusion 0.4 mcg/kg/hr (11/02/19 1717)   heparin 1,650 Units/hr (11/02/19 1539)   HYDROmorphone 0.5 mg/hr (11/02/19 0004)   meropenem (MERREM) IV 1 g (11/02/19 1717)   propofol (DIPRIVAN) infusion 30 mcg/kg/min (11/02/19 1715)   PRN Meds:.sodium chloride, acetaminophen **OR** acetaminophen, HYDROmorphone (DILAUDID) injection, midazolam, ondansetron **OR** ondansetron (ZOFRAN) IV, sodium chloride flush    Anti-infectives (From admission, onward)   Start     Dose/Rate Route Frequency Ordered Stop   10/29/19 1700  meropenem (MERREM) 1 g in sodium chloride 0.9 % 100 mL IVPB        1 g 200 mL/hr over 30 Minutes Intravenous Every 8 hours 10/29/19 1557     10/29/19 1600  doxycycline (VIBRAMYCIN) 100 mg in sodium chloride 0.9 % 250 mL IVPB  Status:  Discontinued        100 mg 125 mL/hr over 120 Minutes Intravenous Every 12 hours 10/29/19 1536 11/02/19 1322   10/28/19 1800  vancomycin (VANCOCIN) IVPB 1000 mg/200 mL premix  Status:  Discontinued        1,000 mg 200 mL/hr over 60 Minutes Intravenous Every 24 hours 10/27/19 1645 10/27/19 1956   10/28/19 1600  azithromycin (ZITHROMAX) 500 mg in sodium chloride 0.9 % 250 mL IVPB  Status:  Discontinued        500 mg 250 mL/hr over 60 Minutes Intravenous Every 24 hours  10/27/19 1956 10/29/19 1536   10/28/19 1600  cefTRIAXone (ROCEPHIN) 1 g in sodium chloride 0.9 % 100 mL IVPB  Status:  Discontinued        1 g 200 mL/hr over 30 Minutes Intravenous Every 24 hours 10/27/19 1956 10/29/19 1536   10/27/19 1645  cefTRIAXone (ROCEPHIN) 1 g in sodium chloride 0.9 % 100 mL IVPB        1 g 200 mL/hr over 30 Minutes Intravenous  Once 10/27/19 1630 10/27/19 1826   10/27/19 1645  azithromycin (ZITHROMAX) 500 mg in sodium chloride 0.9 % 250 mL IVPB        500 mg 250 mL/hr over 60 Minutes Intravenous  Once 10/27/19 1630 10/27/19 1957   10/27/19 1645  vancomycin (VANCOREADY) IVPB 1250 mg/250 mL        1,250 mg 166.7 mL/hr over 90 Minutes  Intravenous  Once 10/27/19 1638 10/27/19 2015        Objective:   Vitals:   11/02/19 1400 11/02/19 1500 11/02/19 1600 11/02/19 1745  BP: (!) 142/78 (!) 97/33 (!) 85/30   Pulse: 93 85 69   Resp: (!) 30 (!) 24 (!) 24   Temp:    100 F (37.8 C)  TempSrc:    Axillary  SpO2: 95% 92% 96%   Weight:      Height:        Wt Readings from Last 3 Encounters:  11/02/19 68 kg  01/11/19 59 kg  01/09/15 65.8 kg     Intake/Output Summary (Last 24 hours) at 11/02/2019 2013 Last data filed at 11/02/2019 1539 Gross per 24 hour  Intake 6297.51 ml  Output 3300 ml  Net 2997.51 ml    Physical Exam  Gen:-Intubated and sedated  HEENT:-ET tube  Neck-No JVD,.  Lungs--improved air movement, no wheezing CV- S1, S2 normal, regular  Abd-  +ve B.Sounds, Abd Soft, ND Extremity/Skin:- No  edema, pedal pulses present NeuroPsych-Limited exam as patient is intubated and sedated and on both Dilaudid and propofol drips GU --- Foley with clear urine   Data Review:   Micro Results Recent Results (from the past 240 hour(s))  Blood Culture (routine x 2)     Status: None   Collection Time: 10/27/19  1:50 PM   Specimen: BLOOD LEFT HAND  Result Value Ref Range Status   Specimen Description BLOOD LEFT HAND  Final   Special Requests   Final    BOTTLES DRAWN AEROBIC AND ANAEROBIC Blood Culture results may not be optimal due to an inadequate volume of blood received in culture bottles   Culture   Final    NO GROWTH 5 DAYS Performed at [facility], [address] [place], [place] [address]    Report Status 11/01/2019 FINAL  Final  SARS Coronavirus 2 by RT PCR (hospital order, performed in [place] hospital lab) Nasopharyngeal Nasopharyngeal Swab     Status: None   Collection Time: 10/27/19  2:14 PM   Specimen: Nasopharyngeal Swab  Result Value Ref Range Status   SARS Coronavirus 2 NEGATIVE NEGATIVE Final    Comment: (NOTE) SARS-CoV-2 target nucleic acids are NOT DETECTED.  The SARS-CoV-2 RNA is  generally detectable in upper and lower respiratory specimens during the acute phase of infection. The lowest concentration of SARS-CoV-2 viral copies this assay can detect is 250 copies / mL. A negative result does not preclude SARS-CoV-2 infection and should not be used as the sole basis for treatment or other patient management decisions.  A negative result  may occur with improper specimen collection / handling, submission of specimen other than nasopharyngeal swab, presence of viral mutation(s) within the areas targeted by this assay, and inadequate number of viral copies (<250 copies / mL). A negative result must be combined with clinical observations, patient history, and epidemiological information.  Fact Sheet for Patients:   StrictlyIdeas.no  Fact Sheet for Healthcare Providers: BankingDealers.co.za  This test is not yet approved or  cleared by the [place] FDA and has been authorized for detection and/or diagnosis of SARS-CoV-2 by FDA under an Emergency Use Authorization (EUA).  This EUA will remain in effect (meaning this test can be used) for the duration of the COVID-19 declaration under Section 564(b)(1) of the Act, 21 U.S.C. section 360bbb-3(b)(1), unless the authorization is terminated or revoked sooner.  Performed at [facility], [address], [place], [place] [address]   Blood Culture (routine x 2)     Status: None   Collection Time: 10/27/19  2:39 PM   Specimen: BLOOD RIGHT HAND  Result Value Ref Range Status   Specimen Description BLOOD RIGHT HAND  Final   Special Requests   Final    BOTTLES DRAWN AEROBIC AND ANAEROBIC Blood Culture adequate volume   Culture   Final    NO GROWTH 5 DAYS Performed at [facility], [address], [place], [place] [address]    Report Status 11/01/2019 FINAL  Final  MRSA PCR Screening     Status: None   Collection Time: 10/27/19  9:30 PM   Specimen: Nasopharyngeal  Result  Value Ref Range Status   MRSA by PCR NEGATIVE NEGATIVE Final    Comment:        The GeneXpert MRSA Assay (FDA approved for NASAL specimens only), is one component of a comprehensive MRSA colonization surveillance program. It is not intended to diagnose MRSA infection nor to guide or monitor treatment for MRSA infections. Performed at [facility], [address] [place], [place] [address]   SARS Coronavirus 2 by RT PCR (hospital order, performed in [facility] hospital lab) Nasopharyngeal     Status: None   Collection Time: 10/27/19  9:32 PM   Specimen: Nasopharyngeal  Result Value Ref Range Status   SARS Coronavirus 2 NEGATIVE NEGATIVE Final    Comment: (NOTE) SARS-CoV-2 target nucleic acids are NOT DETECTED.  The SARS-CoV-2 RNA is generally detectable in upper and lower respiratory specimens during the acute phase of infection. The lowest concentration of SARS-CoV-2 viral copies this assay can detect is 250 copies / mL. A negative result does not preclude SARS-CoV-2 infection and should not be used as the sole basis for treatment or other patient management decisions.  A negative result may occur with improper specimen collection / handling, submission of specimen other than nasopharyngeal swab, presence of viral mutation(s) within the areas targeted by this assay, and inadequate number of viral copies (<250 copies / mL). A negative result must be combined with clinical observations, patient history, and epidemiological information.  Fact Sheet for Patients:   StrictlyIdeas.no  Fact Sheet for Healthcare Providers: BankingDealers.co.za  This test is not yet approved or  cleared by the [place] FDA and has been authorized for detection and/or diagnosis of SARS-CoV-2 by FDA under an Emergency Use Authorization (EUA).  This EUA will remain in effect (meaning this test can be used) for the duration of the COVID-19 declaration  under Section 564(b)(1) of the Act, 21 U.S.C. section 360bbb-3(b)(1), unless the authorization is terminated or revoked sooner.  Performed at [facility], [address] [place], [place] [address]   Culture, respiratory (non-expectorated)     Status: None   Collection Time: 10/28/19  1:56 PM   Specimen: Tracheal Aspirate; Respiratory  Result Value Ref Range Status   Specimen Description   Final    TRACHEAL ASPIRATE Performed at [facility], [address] [place], [place] [address]    Special Requests   Final    Normal Performed at [facility], [address] [place], [place] [address]    Gram Stain   Final    MODERATE WBC PRESENT, PREDOMINANTLY PMN RARE SQUAMOUS EPITHELIAL CELLS PRESENT ABUNDANT YEAST FEW GRAM POSITIVE COCCI    Culture   Final    RARE Normal respiratory flora-no Staph aureus or Pseudomonas seen Performed at [facility] [address], [place], [place] [address]    Report Status 10/31/2019 FINAL  Final  SARS Coronavirus 2 by RT PCR (hospital order, performed in [facility] hospital lab) Nasopharyngeal Nasopharyngeal Swab     Status: None   Collection Time: 10/29/19  9:29 AM   Specimen: Nasopharyngeal Swab  Result Value Ref Range Status   SARS Coronavirus 2 NEGATIVE NEGATIVE Final    Comment: (NOTE) SARS-CoV-2 target nucleic acids are NOT DETECTED.  The SARS-CoV-2 RNA is generally detectable in upper and lower respiratory specimens during the acute phase of infection. The lowest concentration of SARS-CoV-2 viral copies this assay can detect is 250 copies / mL. A negative result does not preclude SARS-CoV-2 infection and should not be used as the sole basis for treatment or other patient management decisions.  A negative result may occur with improper specimen collection / handling, submission of specimen other than nasopharyngeal swab, presence of viral mutation(s) within the areas targeted by this assay, and inadequate number of viral  copies (<250 copies / mL). A negative result must be combined with clinical observations, patient history, and epidemiological information.  Fact Sheet for Patients:   StrictlyIdeas.no  Fact Sheet for Healthcare Providers: BankingDealers.co.za  This test is not yet approved or  cleared by the [place] FDA and has been authorized for detection and/or diagnosis of SARS-CoV-2 by FDA under an Emergency Use Authorization (EUA).  This EUA will remain in effect (meaning this test can be used) for the duration of the COVID-19 declaration under Section 564(b)(1) of the Act, 21 U.S.C. section 360bbb-3(b)(1), unless the authorization is terminated or revoked sooner.  Performed at [facility], [address] [place],  [address]     Radiology Reports CT Angio Chest PE W and/or Wo Contrast  Result Date: 10/28/2019 CLINICAL DATA:  34 year old female with history of shortness of breath for the past 4 days. Fever. EXAM: CT ANGIOGRAPHY CHEST WITH CONTRAST TECHNIQUE: Multidetector CT imaging of the chest was performed using the standard protocol during bolus administration of intravenous contrast. Multiplanar CT image reconstructions and MIPs were obtained to evaluate the vascular anatomy. CONTRAST:  9m OMNIPAQUE IOHEXOL 350 MG/ML SOLN COMPARISON:  Chest CT 01/09/2015. FINDINGS: Cardiovascular: Tiny nonobstructive filling defects are noted within subsegmental pulmonary artery branches to the right lower lobe (axial image 194 of series 5) and the left upper lobe (axial image 155 of series 5). No other larger central, lobar or segmental sized filling defects are noted. Heart size is normal. There is no significant pericardial fluid, thickening or pericardial calcification. No atherosclerotic calcifications in the thoracic aorta or coronary arteries. Mediastinum/Nodes: No pathologically enlarged mediastinal or hilar lymph nodes. Esophagus is  unremarkable in appearance. No axillary lymphadenopathy. Lungs/Pleura: Widespread areas of severe ground-glass attenuation are noted scattered throughout the lungs bilaterally. No pleural effusions. Mild diffuse bronchial wall thickening with moderate centrilobular and paraseptal emphysema. Upper Abdomen: Unremarkable. Musculoskeletal: There are no aggressive appearing lytic or blastic lesions noted in the visualized portions of the skeleton. Review of the MIP images confirms the above findings. IMPRESSION: 1. Small nonocclusive pulmonary emboli in the lungs bilaterally, as above. 2. The appearance of the lungs is highly concerning for severe multilobar bilateral pneumonia, likely from atypical viral etiologies such as COVID infection. 3. Diffuse bronchial wall thickening with moderate centrilobular and paraseptal emphysema; imaging findings suggestive of underlying COPD. These results will be called to the ordering clinician or representative by the Radiologist Assistant, and communication documented in the PACS or [employer]. Emphysema (ICD10-J43.9). Electronically Signed   By: Vinnie Langton M.D.   On: 10/28/2019 09:37   US Abdomen Limited  Result Date: 10/29/2019 CLINICAL DATA:  Elevated LFTs EXAM: ULTRASOUND ABDOMEN LIMITED RIGHT UPPER QUADRANT COMPARISON:  January 11, 2019. FINDINGS: Gallbladder: Status post cholecystectomy. Common bile duct: Diameter: 2 mm, normal Liver: No focal lesion identified. Mildly heterogeneous liver echotexture. Portal vein is patent on color Doppler imaging with normal direction of blood flow towards the liver. Other: None. IMPRESSION: Mildly heterogeneous liver echotexture without focal lesion identified. Electronically Signed   By: Valentino Saxon MD   On: 10/29/2019 09:36   US Venous Img Lower Bilateral (DVT)  Result Date: 10/28/2019 CLINICAL DATA:  Acute respiratory failure. Small volume bilateral pulmonary embolism. Evaluate for DVT. EXAM: BILATERAL LOWER  EXTREMITY VENOUS DOPPLER ULTRASOUND TECHNIQUE: Gray-scale sonography with graded compression, as well as color Doppler and duplex ultrasound were performed to evaluate the lower extremity deep venous systems from the level of the common femoral vein and including the common femoral, femoral, profunda femoral, popliteal and calf veins including the posterior tibial, peroneal and gastrocnemius veins when visible. The superficial great saphenous vein was also interrogated. Spectral Doppler was utilized to evaluate flow at rest and with distal augmentation maneuvers in the common femoral, femoral and popliteal veins. COMPARISON:  None. FINDINGS: RIGHT LOWER EXTREMITY Common Femoral Vein: No evidence of thrombus. Normal compressibility, respiratory phasicity and response to augmentation. Saphenofemoral Junction: No evidence of thrombus. Normal compressibility and flow on color Doppler imaging. Profunda Femoral Vein: No evidence of thrombus. Normal compressibility and flow on color Doppler imaging. Femoral Vein: No evidence of thrombus. Normal compressibility, respiratory phasicity and response to augmentation. Popliteal Vein: No evidence of thrombus. Normal compressibility, respiratory phasicity and response to augmentation. Calf Veins: No evidence of thrombus. Normal compressibility and flow on color Doppler imaging. Superficial Great Saphenous Vein: No evidence of thrombus. Normal compressibility. Venous Reflux:  None. Other Findings:  None. LEFT LOWER EXTREMITY Common Femoral Vein: No evidence of thrombus. Normal compressibility, respiratory phasicity and response to augmentation. Saphenofemoral Junction: No evidence of thrombus. Normal compressibility and flow on color Doppler imaging. Profunda Femoral Vein: No evidence of thrombus. Normal compressibility and flow on color Doppler imaging. Femoral Vein: No evidence of thrombus. Normal compressibility, respiratory phasicity and response to augmentation. Popliteal  Vein: No evidence of thrombus. Normal compressibility, respiratory phasicity and response to augmentation. Calf Veins: No evidence of thrombus. Normal compressibility and flow on color Doppler imaging. Superficial Great Saphenous Vein: No evidence of thrombus. Normal compressibility. Venous Reflux:  None. Other Findings:  None. IMPRESSION: No evidence of DVT within either lower extremity. Electronically Signed   By: Eldridge Abrahams.D.  On: 10/28/2019 16:50   DG CHEST PORT 1 VIEW  Result Date: 11/02/2019 CLINICAL DATA:  Shortness of breath, productive cough. EXAM: PORTABLE CHEST 1 VIEW COMPARISON:  October 31, 2019. FINDINGS: The heart size and mediastinal contours are within normal limits. Endotracheal and nasogastric tubes are unchanged in position. No pneumothorax pleural effusion is noted. Right-sided PICC line is now noted with distal tip in expected position of cavoatrial junction. Mild reticular densities are noted throughout both lungs concerning for inflammation or possibly edema. The visualized skeletal structures are unremarkable. IMPRESSION: Interval placement of right-sided PICC line with distal tip in expected position of cavoatrial junction. Mild reticular densities are noted throughout both lungs concerning for inflammation or possibly edema. Electronically Signed   By: Marijo Conception M.D.   On: 11/02/2019 12:43   DG CHEST PORT 1 VIEW  Result Date: 10/31/2019 CLINICAL DATA:  Evaluate OG tube placement EXAM: PORTABLE CHEST 1 VIEW COMPARISON:  October 31, 2019 FINDINGS: The OG tube terminates in the left side of the abdomen, within the stomach. Stable cardiomegaly. The hila and mediastinum are unchanged. Bilateral pulmonary opacities in the bases are similar to mildly worsened in the interval. No other changes. IMPRESSION: 1. The OG tube terminates in the stomach. 2. Bibasilar infiltrates remain, stable to mildly worsened. 3. The ET tube is been removed in the interval. Electronically Signed   By:  Dorise Bullion III M.D   On: 10/31/2019 16:10   DG CHEST PORT 1 VIEW  Result Date: 10/31/2019 CLINICAL DATA:  Pt. On a vent.   Acute hypoxic respiratory failure. EXAM: PORTABLE CHEST 1 VIEW COMPARISON:  Chest radiograph 10/29/2019 FINDINGS: Stable support apparatus. Unchanged cardiomediastinal contours. There is increased consolidation at the left lung base. Persistent airspace opacities in the right mid to lower lung. No pneumothorax. No definite effusion. No acute osseous finding. IMPRESSION: 1. Increasing consolidation at the left lung base. 2. Persistent airspace opacities in the right mid to lower lung. Electronically Signed   By: Audie Pinto M.D.   On: 10/31/2019 13:27   DG CHEST PORT 1 VIEW  Result Date: 10/29/2019 CLINICAL DATA:  34 year old female with respiratory failure hypoxia. EXAM: PORTABLE CHEST 1 VIEW COMPARISON:  Chest radiograph dated 10/28/2019 and CT dated 10/28/2019. FINDINGS: Endotracheal tube above the carina and enteric tube extends below the diaphragm with tip the on the inferior margin of the image and side-port in the region of the stomach. Bilateral mid to lower lung field airspace opacities similar to prior radiograph. No large pleural effusion. No pneumothorax. Mild cardiomegaly. No acute osseous pathology. IMPRESSION: 1. No significant interval change in bilateral mid to lower lung field airspace opacities. 2. Stable support apparatus. Electronically Signed   By: Anner Crete M.D.   On: 10/29/2019 03:46   DG Chest Portable 1 View  Result Date: 10/28/2019 CLINICAL DATA:  Status post intubation today. EXAM: PORTABLE CHEST 1 VIEW COMPARISON:  Single-view of the chest 10/27/2019. FINDINGS: New NG tube is in place in good position with the side-port in the stomach. Endotracheal tube is also in good position with the tip just below the clavicular heads. Right worse than left airspace disease has progressed since yesterday's exam. No pneumothorax or pleural fluid. Heart  size normal. IMPRESSION: ETT and NG tube in good position. Worsened airspace disease consistent with progressive pneumonia. Electronically Signed   By: Inge Rise M.D.   On: 10/28/2019 11:42   DG Chest Port 1 View  Result Date: 10/27/2019 CLINICAL DATA:  Shortness of  breath. EXAM: PORTABLE CHEST 1 VIEW COMPARISON:  January 09, 2015. FINDINGS: The heart size and mediastinal contours are within normal limits. No pneumothorax or pleural effusion is noted. Multiple ill-defined airspace opacities are noted bilaterally concerning for multifocal pneumonia. The visualized skeletal structures are unremarkable. IMPRESSION: Bilateral multifocal pneumonia. Electronically Signed   By: Marijo Conception M.D.   On: 10/27/2019 14:27   ECHOCARDIOGRAM COMPLETE  Result Date: 10/28/2019    ECHOCARDIOGRAM REPORT   Patient Name:   HOLLEY KOCUREK Date of Exam: 10/28/2019 Medical Rec #:  [id number]       Height:       64.0 in Accession #:    [id number]      Weight:       120.0 lb Date of Birth:  1985/09/09        BSA:          1.575 m Patient Age:    53 years        BP:           130/94 mmHg Patient Gender: F               HR:           116 bpm. Exam Location:  [facility] Procedure: 2D Echo, Cardiac Doppler and Color Doppler Indications:    Pulmonary Embolus 415.19 / I26.99  History:        Patient has no prior history of Echocardiogram examinations.                 Multifocal pneumonia, Acute respiratory failure with hypoxia.  Sonographer:    Alvino Chapel RCS Referring Phys: [phone] Mayur Duman IMPRESSIONS  1. Left ventricular ejection fraction, by estimation, is 55 to 60%. The left ventricle has normal function. The left ventricle has no regional wall motion abnormalities. Left ventricular diastolic parameters are indeterminate.  2. Right ventricular systolic function is moderately reduced. The right ventricular size is severely enlarged. There is moderately elevated pulmonary artery systolic pressure.  3. The mitral valve  is normal in structure. Mild mitral valve regurgitation. No evidence of mitral stenosis.  4. The aortic valve has an indeterminant number of cusps. Aortic valve regurgitation is not visualized. No aortic stenosis is present. FINDINGS  Left Ventricle: Left ventricular ejection fraction, by estimation, is 55 to 60%. The left ventricle has normal function. The left ventricle has no regional wall motion abnormalities. The left ventricular internal cavity size was normal in size. There is  no left ventricular hypertrophy. Left ventricular diastolic parameters are indeterminate. Right Ventricle: There is septal flattening in systole. The right ventricular size is severely enlarged. No increase in right ventricular wall thickness. Right ventricular systolic function is moderately reduced. There is moderately elevated pulmonary artery systolic pressure. The tricuspid regurgitant velocity is 3.23 m/s, and with an assumed right atrial pressure of 10 mmHg, the estimated right ventricular systolic pressure is 85.2 mmHg. Left Atrium: Left atrial size was normal in size. Right Atrium: Right atrial size was normal in size. Pericardium: There is no evidence of pericardial effusion. Mitral Valve: The mitral valve is normal in structure. Mild mitral valve regurgitation. No evidence of mitral valve stenosis. Tricuspid Valve: The tricuspid valve is normal in structure. Tricuspid valve regurgitation is mild . No evidence of tricuspid stenosis. Aortic Valve: The aortic valve has an indeterminant number of cusps. Aortic valve regurgitation is not visualized. No aortic stenosis is present. Aortic valve mean gradient measures 2.7 mmHg. Aortic valve peak gradient measures  4.9 mmHg. Aortic valve area, by VTI measures 1.50 cm. Pulmonic Valve: The pulmonic valve was not well visualized. Pulmonic valve regurgitation is not visualized. No evidence of pulmonic stenosis. Aorta: The aortic root is normal in size and structure. Venous: IVC  assessment for right atrial pressure unable to be performed due to mechanical ventilation. IAS/Shunts: No atrial level shunt detected by color flow Doppler.  LEFT VENTRICLE PLAX 2D LVIDd:         3.94 cm  Diastology LVIDs:         2.98 cm  LV e' lateral:   19.90 cm/s LV PW:         0.80 cm  LV E/e' lateral: 4.6 LV IVS:        0.83 cm  LV e' medial:    14.40 cm/s LVOT diam:     1.70 cm  LV E/e' medial:  6.3 LV SV:         27 LV SV Index:   17 LVOT Area:     2.27 cm  RIGHT VENTRICLE RV S prime:     15.70 cm/s TAPSE (M-mode): 2.2 cm LEFT ATRIUM           Index       RIGHT ATRIUM           Index LA diam:      2.20 cm 1.40 cm/m  RA Area:     17.40 cm LA Vol (A4C): 34.9 ml 22.16 ml/m RA Volume:   48.50 ml  30.80 ml/m  AORTIC VALVE AV Area (Vmax):    1.78 cm AV Area (Vmean):   1.50 cm AV Area (VTI):     1.50 cm AV Vmax:           110.51 cm/s AV Vmean:          78.791 cm/s AV VTI:            0.178 m AV Peak Grad:      4.9 mmHg AV Mean Grad:      2.7 mmHg LVOT Vmax:         86.50 cm/s LVOT Vmean:        52.200 cm/s LVOT VTI:          0.117 m LVOT/AV VTI ratio: 0.66  AORTA Ao Root diam: 3.10 cm MITRAL VALVE               TRICUSPID VALVE MV Area (PHT): 6.96 cm    TR Peak grad:   41.7 mmHg MV Decel Time: 109 msec    TR Vmax:        323.00 cm/s MV E velocity: 90.90 cm/s                            SHUNTS                            Systemic VTI:  0.12 m                            Systemic Diam: 1.70 cm Carlyle Dolly MD Electronically signed by Carlyle Dolly MD Signature Date/Time: 10/28/2019/3:31:55 PM    Final    [place] EKG SITE RITE  Result Date: 11/01/2019 If Site Rite image not attached, placement could not be confirmed due to current cardiac rhythm.    CBC Recent Labs  Lab 10/27/19  1438 10/28/19 0331 10/30/19 0354 10/30/19 1331 10/31/19 0419 11/01/19 0623 11/02/19 0713  WBC 13.8*   < > 10.4 10.1 7.7 9.4 10.6*  HGB 10.3*   < > 7.8* 7.5* 7.8* 7.8* 8.1*  HCT 31.3*   < > 24.0* 24.1* 25.3* 24.9* 24.7*   PLT 217   < > 38* 45* 62* 119* 223  MCV 83.5   < > 84.5 87.0 87.8 88.6 88.8  MCH 27.5   < > 27.5 27.1 27.1 27.8 29.1  MCHC 32.9   < > 32.5 31.1 30.8 31.3 32.8  RDW 19.7*   < > 20.4* 20.3* 19.9* 20.3* 21.2*  LYMPHSABS 0.7  --   --   --   --   --   --   MONOABS 0.3  --   --   --   --   --   --   EOSABS 0.1  --   --   --   --   --   --   BASOSABS 0.1  --   --   --   --   --   --    < > = values in this interval not displayed.   Chemistries  Recent Labs  Lab 10/27/19 1438 10/27/19 1438 10/28/19 0331 10/28/19 0331 10/28/19 0941 10/28/19 0941 10/29/19 0408 10/29/19 0408 10/30/19 0354 10/30/19 1139 10/30/19 1331 10/31/19 0419 11/01/19 0623 11/02/19 0713  NA 135   < > 138   < > 137   < > 138   < > 140  --  141 141 143 140  K 2.7*   < > 4.0   < > 4.1   < > 3.9   < > 3.4*  --  3.1* 3.8 2.8* 2.7*  CL 106   < > 112*   < > 112*   < > 113*   < > 111  --  112* 109 107 104  CO2 16*   < > 15*   < > 15*   < > 17*   < > 23  --  23 24 25 27   GLUCOSE 96   < > 125*   < > 121*   < > 150*   < > 124*  --  101* 110* 106* 96  BUN 19   < > 18   < > 18   < > 24*   < > 11  --  9 10 13 13   CREATININE 0.89   < > 0.66   < > 0.65   < > 0.71   < > 0.50  --  0.53 0.42* 0.37* 0.35*  CALCIUM 8.0*   < > 7.7*   < > 7.8*   < > 7.7*   < > 7.7*  --  7.5* 7.6* 7.6* 7.2*  MG 2.0  --  2.6*  --   --   --   --   --   --  1.9  --   --   --  1.6*  AST 26   < > 26  --  22  --  123*  --  32  --  34  --   --   --   ALT 12   < > 16  --  15  --  59*  --  43  --  43  --   --   --   ALKPHOS 127*   < > 162*  --  150*  --  130*  --  97  --  92  --   --   --   BILITOT 0.5   < > 0.8  --  0.8  --  0.6  --  0.4  --  0.6  --   --   --    < > = values in this interval not displayed.   ------------------------------------------------------------------------------------------------------------------ Recent Labs    11/01/19 0623 11/02/19 0713  TRIG 171* 926*    No results found for:  HGBA1C ------------------------------------------------------------------------------------------------------------------ No results for input(s): TSH, T4TOTAL, T3FREE, THYROIDAB in the last 72 hours.  Invalid input(s): FREET3 ------------------------------------------------------------------------------------------------------------------ No results for input(s): VITAMINB12, FOLATE, FERRITIN, TIBC, IRON, RETICCTPCT in the last 72 hours.  Coagulation profile Recent Labs  Lab 10/30/19 0354 10/30/19 1331 10/31/19 0419 11/01/19 0623 11/02/19 0713  INR 1.4* 1.4* 1.3* 1.2 1.1    No results for input(s): DDIMER in the last 72 hours.  Cardiac Enzymes No results for input(s): CKMB, TROPONINI, MYOGLOBIN in the last 168 hours.  Invalid input(s): CK ------------------------------------------------------------------------------------------------------------------    Component Value Date/Time   BNP 271.0 (H) 10/27/2019 1438   Roxan Hockey M.D on 11/02/2019 at 8:13 PM  Go to [email or web address] - for contact info  [facility] - Office  [phone]

## 2019-11-02 NOTE — Progress Notes (Signed)
Nutrition Follow up  DOCUMENTATION CODES:   Not applicable  INTERVENTION:  -Vital 1.2 @ 40 ml/hr with 45 ml ProSource TF 3x daily via OG tube  -MVI with minerals daily via tube  This regimen at goal rate 40 ml/hr will provide 1272 kcal (1693 total kcal with propofol at current rate; meets 105% of estimated kcal needs), 105 grams of protein, and 778 ml free water  Free water per MD discretion  NUTRITION DIAGNOSIS:   Inadequate oral intake related to inability to eat as evidenced by NPO status.  GOAL:   Provide needs based on ASPEN/SCCM guidelines   MONITOR:   Labs, I & O's, Vent status, Weight trends, Diet advancement, Skin  REASON FOR ASSESSMENT:   Consult Enteral/tube feeding initiation and management  ASSESSMENT:   34 year old female with history significant for asthma, seizures, ongoing heroin abuse presented with 4 days of worsening SOB and subjective fevers with chills admitted for acute respiratory failure with hypoxia.Severe multilobar bilateral pneumonia. Oncology following- severe thrombocytopenia.   Patient is currently intubated on ventilator support. Discussed pt with her nurse. Tolerating her tube feeds currently though she did pull her OGT out over the weekend and it was replaced. MV: 10.9  L/min Temp (24hrs), Avg:99.6 F (37.6 C), Min:99 F (37.2 C), Max:100 F (37.8 C)  Propofol: 15.96 ml/hr [providing 421 kcal lipids q 24 hr at current rate].  Labs: Triglycerides 926 (H) , LDL- 380 (H) , Hypomagnesemia/hypokalemia (repleteing)  BMP Latest Ref Rng & Units 11/02/2019 11/01/2019 10/31/2019  Glucose 70 - 99 mg/dL 96 401(U) 272(Z)  BUN 6 - 20 mg/dL 13 13 10   Creatinine 0.44 - 1.00 mg/dL ) 3.66(Y) 4.03(K)  Sodium 135 - 145 mmol/L 140 143 141  Potassium 3.5 - 5.1 mmol/L 2.7(LL) 2.8(L) 3.8  Chloride 98 - 111 mmol/L 104 107 109  CO2 22 - 32 mmol/L 27 25 24   Calcium 8.9 - 10.3 mg/dL 7.2(L) 7.6(L) 7.6(L)    Medications reviewed and include: Protonix,  Colace, Folvite, Solumedrol.  IV-D5/0.45 NS @ 150 ml/hr    Intake/Output Summary (Last 24 hours) at 11/02/2019 1335 Last data filed at 11/02/2019 1234 Gross per 24 hour  Intake 6319.85 ml  Output 5300 ml  Net 1019.85 ml  Admit wt. 54.4 kg reported by pt?? Per chart review usual range 61-66 kg   Diet Order:   Diet Order            Diet NPO time specified  Diet effective now                 EDUCATION NEEDS:   No education needs have been identified at this time  Skin:  Skin Assessment: Reviewed RN Assessment  Last BM:  8/29 type 7  Height:   Ht Readings from Last 1 Encounters:  11/02/19 5\' 8"  (1.727 m)    Weight:   Wt Readings from Last 1 Encounters:  11/02/19 68 kg    Ideal Body Weight:  54.5 kg  BMI:  Body mass index is 22.79 kg/m.  Estimated Nutritional Needs:   Kcal:  1609  Protein:  82-93  Fluid:  >/= 1.6 L/day  11/04/19 MS,RD,CSG,LDN Pager: #[phone]

## 2019-11-02 NOTE — Progress Notes (Signed)
**Note De-Identified  Obfuscation** Respiratory note:  Attempted SBT again this afternoon; did not tolerate CPAP/PS of 14/5. Placed back onto full support due to agitation,RR in 40s, and increased WOB, coughing. SAT 94%.  RRT to continue to monitor.

## 2019-11-02 NOTE — Progress Notes (Signed)
ANTICOAGULATION CONSULT NOTE   Pharmacy Consult for Heparin  Indication: pulmonary embolus  Allergies  Allergen Reactions  . Bactrim [Sulfamethoxazole-Trimethoprim] Nausea And Vomiting  . Ciprofloxacin Nausea And Vomiting  . Darvocet [Propoxyphene N-Acetaminophen] Hives  . Other     darvocet  . Penicillins Itching  . Penicillins Hives  . Bactrim [Sulfamethoxazole-Trimethoprim] Rash  . Ciprofloxacin Rash    Patient Measurements: Height: 5\' 8"  (172.7 cm) Weight: 68 kg (149 lb 14.6 oz) IBW/kg (Calculated) : 63.9 Heparin Dosing Weight: HEPARIN DW (KG): 54.4   Vital Signs: Temp: 100 F (37.8 C) (08/30 1745) Temp Source: Axillary (08/30 1745) BP: 85/30 (08/30 1600) Pulse Rate: 69 (08/30 1600)  Labs: Recent Labs    10/31/19 0419 10/31/19 1644 11/01/19 0623 11/01/19 1254 11/01/19 2026 11/02/19 0713 11/02/19 1930  HGB 7.8*  --  7.8*  --   --  8.1*  --   HCT 25.3*  --  24.9*  --   --  24.7*  --   PLT 62*  --  119*  --   --  223  --   APTT  --   --   --   --   --  65*  --   LABPROT 15.4*  --  15.1  --   --  13.8  --   INR 1.3*  --  1.2  --   --  1.1  --   HEPARINUNFRC  --    < >  --    < > 0.34 0.26* 0.46  CREATININE 0.42*  --  0.37*  --   --  0.35*  --    < > = values in this interval not displayed.    Estimated Creatinine Clearance: 100 mL/min (A) (by C-G formula based on SCr of 0.35 mg/dL (L)).  Assessment: Diane Norton a 34 y.o. female requires anticoagulation with a heparin iv infusion for the indication of  pulmonary embolus. Heparin gtt will be started following pharmacy protocol per pharmacy consult.   Heparin level is therapeutic at 0.46 on 1650 units/hr. No bleeding noted.  Goal of Therapy:  Heparin level 0.3-0.7 units/ml Monitor platelets by anticoagulation protocol: Yes   Plan:  Continue heparin infusion at 1650 units/hr Daily heparin level, CBC Continue to monitor H&H and s/s of bleeding  Thank you for involving pharmacy in this patient's  care.  20, PharmD, BCPS Clinical Pharmacist Clinical phone for 11/02/2019 until 10p is 11/04/2019 11/02/2019 8:13 PM  **Pharmacist phone directory can be found on [email or web address] listed under [facility]**

## 2019-11-02 NOTE — Progress Notes (Signed)
ANTICOAGULATION CONSULT NOTE   Pharmacy Consult for Heparin  Indication: pulmonary embolus  Allergies  Allergen Reactions  . Bactrim [Sulfamethoxazole-Trimethoprim] Nausea And Vomiting  . Ciprofloxacin Nausea And Vomiting  . Darvocet [Propoxyphene N-Acetaminophen] Hives  . Other     darvocet  . Penicillins Itching  . Penicillins Hives  . Bactrim [Sulfamethoxazole-Trimethoprim] Rash  . Ciprofloxacin Rash    Patient Measurements: Height: 5\' 8"  (172.7 cm) Weight: 68 kg (149 lb 14.6 oz) IBW/kg (Calculated) : 63.9 Heparin Dosing Weight: HEPARIN DW (KG): 54.4   Vital Signs: Temp: 100 F (37.8 C) (08/30 0740) Temp Source: Axillary (08/30 0740) BP: 145/75 (08/30 1000) Pulse Rate: 90 (08/30 1000)  Labs: Recent Labs    10/31/19 0419 10/31/19 1644 11/01/19 0044 11/01/19 0623 11/01/19 1254 11/01/19 2026 11/02/19 0713  HGB 7.8*  --   --  7.8*  --   --  8.1*  HCT 25.3*  --   --  24.9*  --   --  24.7*  PLT 62*  --   --  119*  --   --  223  APTT  --   --   --   --   --   --  65*  LABPROT 15.4*  --   --  15.1  --   --  13.8  INR 1.3*  --   --  1.2  --   --  1.1  HEPARINUNFRC  --    < >   < >  --  0.48 0.34 0.26*  CREATININE 0.42*  --   --  0.37*  --   --  0.35*   < > = values in this interval not displayed.    Estimated Creatinine Clearance: 100 mL/min (A) (by C-G formula based on SCr of 0.35 mg/dL (L)).  Assessment: Diane Norton a 34 y.o. female requires anticoagulation with a heparin iv infusion for the indication of  pulmonary embolus. Heparin gtt will be started following pharmacy protocol per pharmacy consult.   Improving platelets, fibrinogen, and DIC- restart heparin per Dr. 20.  Heparin level 0.26- subtherapeutic   Goal of Therapy:  Heparin level 0.3-0.7 units/ml Monitor platelets by anticoagulation protocol: Yes   Plan:  Increase heparin infusion at 1650 units/hr Re-check heparin level in 6-8 hours Continue to monitor H&H and s/s of  bleeding.  [name], PharmD Clinical Pharmacist 11/02/2019 10:36 AM

## 2019-11-02 NOTE — Progress Notes (Signed)
At 1413, patient switched from propofol to Precedex per orders. Propofol was tirated down to 10 mcg/kg/min before being cut off and starting Precedex. Repiratory was called to attempt to wean patient as patient was awake. However, patient got extremely agitated and would not follow commands. Also, patient began breathing up to 48 breaths per minute. Precedex gtt was continuously titrated up until it reached 1.2 mcg/kg/hr. 2 mg of versed had also been administered with no change in patient's agitation. Dilaudid gtt had also been tirated up. Pt continued to be agitated and not following commands. Respiratory flipped patient back off of weaning mode. Dr. Mariea Clonts at beside and gave verbal order to restart patient back on propofol gtt. Propofol restarted at 50 mcg/kg/min. Dilaudid 3 mg bolus also administered at this time. Pt currently calm and resting, breathing 24/min. Will continue to monitor and tirate down Propofol and dilaudid back to previous dose before being switched to Precedex.

## 2019-11-02 NOTE — Progress Notes (Signed)
[name], MRN:  [id number], DOB:  09-13-1985, LOS: 6 ADMISSION DATE:  10/27/2019, CONSULTATION DATE:  8/25 REFERRING MD:  Margot Chimes, CHIEF COMPLAINT:  Acute resp failure   Brief History   16 yowf smoker with h/o asthma with 4 days of worse sob PTA assoc dry cough ? Fever/multiple chills presented with sats in 60s improved on 02 initially then req intubation 8/25 and PCCM asked to eval at that point  Patient got Pfizer Covid vaccine 2 doses in March.     Past Medical History  Asthma SZ   Significant Hospital Events   8/26 >> heparin stopped due to drop in plts, restarted 8/28  Consults:  PCCM 8/25  Procedures:  Oral ET  8/25 >>>  Significant Diagnostic Tests:  CTa 8/25 >>> 1. Small nonocclusive pulmonary emboli in the lungs bilaterally, as above. The appearance of the lungs is highly concerning for severe multilobar bilateral pneumonia Echo 8/25 1. Left ventricular ejection fraction, by estimation, is 55 to 60%.  2. Right ventricular systolic function is moderately reduced. The right  ventricular size is severely enlarged. There is moderately elevated  pulmonary artery systolic pressure.   Venous dopplers 8/25 neg    Micro Data:  BC x 2  8/24 >> ng MRSA PCR  8/24  Covid 19 PCR  8/24  And 8/26  Neg x 3  PCT   8/24    2.08  Urine legionella   8/25  Neg  Urine Strep  8/25  Neg  resp 8/25  >> nml flora   Antimicrobials:  Vanc 8/24  Rocephin 8/24 >>> 8/27 Zmax 8/24 >>> 8/27  Doxy 8/27 >>>   Meropenem 8/27 >>>    Scheduled Meds: . chlorhexidine gluconate (MEDLINE KIT)  15 mL Mouth Rinse BID  . Chlorhexidine Gluconate Cloth  6 each Topical Daily  . cloNIDine  0.1 mg Per Tube TID  . docusate  100 mg Per Tube BID  . feeding supplement (PROSource TF)  45 mL Per Tube TID  . feeding supplement (VITAL AF 1.2 CAL)  1,000 mL Per Tube Q24H  . folic acid  1 mg Oral Daily  . ipratropium-albuterol  3 mL Nebulization Q4H  . mouth rinse  15 mL Mouth  Rinse 10 times per day  . methylPREDNISolone (SOLU-MEDROL) injection  40 mg Intravenous Daily  . multivitamin with minerals  1 tablet Oral Daily  . pantoprazole sodium  40 mg Per Tube Q1200  . potassium chloride  40 mEq Oral Once  . rocuronium  50 mg Intravenous Once  . sodium chloride flush  3 mL Intravenous Q12H  . thiamine  100 mg Oral Daily   Or  . thiamine  100 mg Intravenous Daily   Continuous Infusions: . sodium chloride    . dextrose 5 % and 0.45% NaCl 150 mL/hr at 11/02/19 1100  . doxycycline (VIBRAMYCIN) IV Stopped (11/02/19 0531)  . heparin 1,650 Units/hr (11/02/19 1100)  . HYDROmorphone 0.5 mg/hr (11/02/19 0004)  . meropenem (MERREM) IV Stopped (11/02/19 0853)  . potassium chloride 10 mEq (11/02/19 1234)  . propofol (DIPRIVAN) infusion 40 mcg/kg/min (11/02/19 1100)   PRN Meds:.sodium chloride, acetaminophen **OR** acetaminophen, HYDROmorphone (DILAUDID) injection, LORazepam, ondansetron **OR** ondansetron (ZOFRAN) IV, sodium chloride flush     Interim history/subjective:   Critically ill, intubated Agitated on WUA & given ativan, now sedate Good UO Low gr febrile   Objective   Blood pressure 134/70, pulse 81, temperature 100 F (37.8 C), temperature source Axillary,  resp. rate (!) 24, height 5' 8"  (1.727 m), weight 68 kg, last menstrual period 10/26/2019, SpO2 97 %.    Vent Mode: PRVC FiO2 (%):  [35 %-45 %] 40 % Set Rate:  [24 bmp] 24 bmp Vt Set:  [540 mL] 540 mL PEEP:  [5 cmH20] 5 cmH20 Plateau Pressure:  [19 cmH20-28 cmH20] 19 cmH20   Intake/Output Summary (Last 24 hours) at 11/02/2019 1317 Last data filed at 11/02/2019 1234 Gross per 24 hour  Intake 6319.85 ml  Output 5300 ml  Net 1019.85 ml   Filed Weights   10/31/19 0436 11/01/19 0300 11/02/19 0331  Weight: 70.4 kg 66.5 kg 68 kg    Examination: Gen:      Young woman, no distress  HEENT:  EOMI, sclera anicteric, mild pallor , oral ETT Neck:     No JVD; no thyromegaly Lungs:    BL ventilated  BS CV:         Regular rate and rhythm; no murmurs Abd:      + bowel sounds; soft, non-tender; no palpable masses, no distension Ext:    No edema; adequate peripheral perfusion Skin:      Warm and dry; no rash Neuro: sedate, RASS-3     CXR 8/30 mild interstitial infiltrates BL, improved from prior  Labs show hypokalemia, hypomag, noleucocysotsis, stable anemia, improved plts    Resolved Hospital Problem list    Thromocytopenia   Assessment & Plan:  1)  Acute CAP in pt with Asthma/ active wheezing  DD includes heroin induced non cardiogenic edema - ct meropenem , Broadened since PCT trend in wrong direction as of 8/27 -dc doxy   2) Acute vent dep resp failure secondary to cap/asthma >>> airtrapping when breath over 25 so set at 24  -Start SBTs with goal extubation   3) Multiple PE not typically assoc with ards pattern x in pts with collagen vasc dz eg lupus / Antiphospholipid Syndrome (APS)  -  venous dopplers  Neg 8/25 - Echo suggests PE may not be  acute but definitely affecting RH function  - resumed heparin , now that plts recovered    4) Acute encephalopathy - ? Heroin withdrawal contributing -using dilaudid gtt -dc propofol, TGs rising, change to precedex, goal RASS -1 - Versed prn for breakthrough, dc ativan   5) Anemia   Lab Results  Component Value Date   HGB 8.1 (L) 11/02/2019   HGB 7.8 (L) 11/01/2019   HGB 7.8 (L) 10/31/2019    C/w critical ilness ? Some alveolar hemmorrhage as well as no other obvious bleeding source Rec > tx for  hgb < 7 unless hemodynamically unstable   Hypomagnesemia/hypokalemia - replete  Best practice:  Diet:npo Pain/Anxiety/Delirium protocol (if indicated): precedex / dilaudid VAP protocol (if indicated):  DVT prophylaxis: hep drip stopped 8/26 due to ? DIC GI prophylaxis: ppi changed to per FT 8/27  Glucose control:  Mobility: bed rest Code Status: full code  Family Communication: per [facility]  Disposition: ICU  Labs    CBC: Recent Labs  Lab 10/27/19 1438 10/28/19 0331 10/30/19 0354 10/30/19 1331 10/31/19 0419 11/01/19 0623 11/02/19 0713  WBC 13.8*   < > 10.4 10.1 7.7 9.4 10.6*  NEUTROABS 12.7*  --   --   --   --   --   --   HGB 10.3*   < > 7.8* 7.5* 7.8* 7.8* 8.1*  HCT 31.3*   < > 24.0* 24.1* 25.3* 24.9* 24.7*  MCV 83.5   < >  84.5 87.0 87.8 88.6 88.8  PLT 217   < > 38* 45* 62* 119* 223   < > = values in this interval not displayed.    Basic Metabolic Panel: Recent Labs  Lab 10/27/19 1438 10/27/19 1438 10/28/19 0331 10/28/19 0941 10/30/19 0354 10/30/19 1139 10/30/19 1331 10/30/19 1638 10/31/19 0419 10/31/19 1644 11/01/19 0623 11/02/19 0713  NA 135   < > 138   < > 140  --  141  --  141  --  143 140  K 2.7*   < > 4.0   < > 3.4*  --  3.1*  --  3.8  --  2.8* 2.7*  CL 106   < > 112*   < > 111  --  112*  --  109  --  107 104  CO2 16*   < > 15*   < > 23  --  23  --  24  --  25 27  GLUCOSE 96   < > 125*   < > 124*  --  101*  --  110*  --  106* 96  BUN 19   < > 18   < > 11  --  9  --  10  --  13 13  CREATININE 0.89   < > 0.66   < > 0.50  --  0.53  --  0.42*  --  0.37* 0.35*  CALCIUM 8.0*   < > 7.7*   < > 7.7*  --  7.5*  --  7.6*  --  7.6* 7.2*  MG 2.0  --  2.6*  --   --  1.9  --   --   --   --   --  1.6*  PHOS  --   --  2.4*  --   --  1.2*  --  1.7* 2.7 2.5  --   --    < > = values in this interval not displayed.   GFR: Estimated Creatinine Clearance: 100 mL/min (A) (by C-G formula based on SCr of 0.35 mg/dL (L)). Recent Labs  Lab 10/27/19 1438 10/28/19 0331 10/29/19 1157 10/29/19 1337 10/30/19 0354 10/30/19 0354 10/30/19 1331 10/31/19 0419 11/01/19 0623 11/02/19 0713  PROCALCITON 2.08  --   --  0.81 4.68  --   --  0.21  --   --   WBC 13.8*   < >   < >  --  10.4   < > 10.1 7.7 9.4 10.6*  LATICACIDVEN 1.3  --   --   --   --   --   --   --   --   --    < > = values in this interval not displayed.    Liver Function Tests: Recent Labs  Lab 10/28/19 0331 10/28/19 0941  10/29/19 0408 10/30/19 0354 10/30/19 1331  AST 26 22 123* 32 34  ALT 16 15 59* 43 43  ALKPHOS 162* 150* 130* 97 92  BILITOT 0.8 0.8 0.6 0.4 0.6  PROT 6.2* 5.7* 5.3* 5.1* 4.7*  ALBUMIN 2.9* 2.7* 2.6* 2.6* 2.4*   No results for input(s): LIPASE, AMYLASE in the last 168 hours. No results for input(s): AMMONIA in the last 168 hours.  ABG    Component Value Date/Time   PHART 7.471 (H) 10/31/2019 0400   PCO2ART 37.5 10/31/2019 0400   PO2ART 142 (H) 10/31/2019 0400   HCO3 27.7 10/31/2019 0400   ACIDBASEDEF 6.2 (H) 10/29/2019 0340   O2SAT  98.6 10/31/2019 0400     Coagulation Profile: Recent Labs  Lab 10/30/19 0354 10/30/19 1331 10/31/19 0419 11/01/19 0623 11/02/19 0713  INR 1.4* 1.4* 1.3* 1.2 1.1    Cardiac Enzymes: No results for input(s): CKTOTAL, CKMB, CKMBINDEX, TROPONINI in the last 168 hours.  HbA1C: No results found for: HGBA1C  CBG: Recent Labs  Lab 11/01/19 2004 11/01/19 2359 11/02/19 0350 11/02/19 0725 11/02/19 1120  GLUCAP 112* 109* 99 100* 150*     The patient is critically ill with multiple organ systems failure and requires high complexity decision making for assessment and support, frequent evaluation and titration of therapies, application of advanced monitoring technologies and extensive interpretation of multiple databases. Critical Care Time devoted to patient care services described in this note independent of APP/resident  time is 32 minutes.   Kara Mead MD. Shade Flood. [facility]  If no response to pager , please call [phone]  After 7:00 pm call Elink  [phone]   11/02/2019

## 2019-11-02 NOTE — Progress Notes (Signed)
**Note De-Identified  Obfuscation** Respiratory Note:  Attempted SBT this AM; tolerated moderately well for 30 min. Placed back onto full support due to  agitation, increased WOB, coughing; moderate thick yellow bloody secretions.  SAT 93%.  RRT to continue to monitor.

## 2019-11-03 LAB — CBC
HCT: 25.7 % — ABNORMAL LOW (ref 36.0–46.0)
Hemoglobin: 7.9 g/dL — ABNORMAL LOW (ref 12.0–15.0)
MCH: 27.2 pg (ref 26.0–34.0)
MCHC: 30.7 g/dL (ref 30.0–36.0)
MCV: 88.6 fL (ref 80.0–100.0)
Platelets: 258 10*3/uL (ref 150–400)
RBC: 2.9 MIL/uL — ABNORMAL LOW (ref 3.87–5.11)
RDW: 20.6 % — ABNORMAL HIGH (ref 11.5–15.5)
WBC: 11.1 10*3/uL — ABNORMAL HIGH (ref 4.0–10.5)
nRBC: 0.3 % — ABNORMAL HIGH (ref 0.0–0.2)

## 2019-11-03 LAB — BASIC METABOLIC PANEL
Anion gap: 10 (ref 5–15)
BUN: 14 mg/dL (ref 6–20)
CO2: 28 mmol/L (ref 22–32)
Calcium: 7.7 mg/dL — ABNORMAL LOW (ref 8.9–10.3)
Chloride: 103 mmol/L (ref 98–111)
Creatinine, Ser: 0.35 mg/dL — ABNORMAL LOW (ref 0.44–1.00)
GFR calc Af Amer: >60 mL/min (ref >60)
GFR calc non Af Amer: >60 mL/min (ref >60)
Glucose, Bld: 104 mg/dL — ABNORMAL HIGH (ref 70–99)
Potassium: 3.1 mmol/L — ABNORMAL LOW (ref 3.5–5.1)
Sodium: 141 mmol/L (ref 135–145)

## 2019-11-03 LAB — GLUCOSE, CAPILLARY
Glucose-Capillary: 107 mg/dL — ABNORMAL HIGH (ref 70–99)
Glucose-Capillary: 110 mg/dL — ABNORMAL HIGH (ref 70–99)
Glucose-Capillary: 110 mg/dL — ABNORMAL HIGH (ref 70–99)
Glucose-Capillary: 110 mg/dL — ABNORMAL HIGH (ref 70–99)
Glucose-Capillary: 117 mg/dL — ABNORMAL HIGH (ref 70–99)
Glucose-Capillary: 123 mg/dL — ABNORMAL HIGH (ref 70–99)

## 2019-11-03 LAB — HEPARIN LEVEL (UNFRACTIONATED)
Heparin Unfractionated: 0.45 IU/mL (ref 0.30–0.70)
Heparin Unfractionated: 0.35 IU/mL (ref 0.30–0.70)

## 2019-11-03 LAB — TRIGLYCERIDES: Triglycerides: 222 mg/dL — ABNORMAL HIGH (ref <150)

## 2019-11-03 MED ORDER — HYDROMORPHONE HCL 1 MG/ML IJ SOLN: 1.0000 mg | INTRAMUSCULAR | Status: DC | PRN

## 2019-11-03 MED ORDER — POTASSIUM CHLORIDE 20 MEQ PO PACK
40.0000 meq | PACK | ORAL | Status: AC
  Administered 2019-11-03 (×2): 40 meq via ORAL
  Filled 2019-11-03 (×2): qty 2

## 2019-11-03 MED ORDER — POTASSIUM CHLORIDE 10 MEQ/100ML IV SOLN
10.0000 meq | INTRAVENOUS | Status: AC
  Administered 2019-11-03 (×4): 10 meq via INTRAVENOUS
  Filled 2019-11-03 (×4): qty 100

## 2019-11-03 MED ORDER — ALBUTEROL SULFATE (2.5 MG/3ML) 0.083% IN NEBU: 2.5000 mg | INHALATION_SOLUTION | RESPIRATORY_TRACT | Status: DC | PRN

## 2019-11-03 MED ORDER — IPRATROPIUM-ALBUTEROL 0.5-2.5 (3) MG/3ML IN SOLN
3.0000 mL | Freq: Three times a day (TID) | RESPIRATORY_TRACT | Status: DC
  Administered 2019-11-03 – 2019-11-05 (×5): 3 mL via RESPIRATORY_TRACT
  Filled 2019-11-03 (×5): qty 3

## 2019-11-03 MED ORDER — MAGNESIUM SULFATE 2 GM/50ML IV SOLN
2.0000 g | Freq: Once | INTRAVENOUS | Status: AC
  Administered 2019-11-03: 2 g via INTRAVENOUS
  Filled 2019-11-03: qty 50

## 2019-11-03 MED ORDER — TRAZODONE HCL 50 MG PO TABS
150.0000 mg | ORAL_TABLET | Freq: Every day | ORAL | Status: DC
  Administered 2019-11-03 – 2019-11-04 (×2): 150 mg via ORAL
  Filled 2019-11-03 (×2): qty 3

## 2019-11-03 MED ORDER — PREDNISONE 20 MG PO TABS
20.0000 mg | ORAL_TABLET | Freq: Every day | ORAL | Status: AC
  Administered 2019-11-04 – 2019-11-05 (×2): 20 mg via ORAL
  Filled 2019-11-03 (×2): qty 1

## 2019-11-03 MED ORDER — LORAZEPAM 2 MG/ML IJ SOLN
1.0000 mg | Freq: Four times a day (QID) | INTRAMUSCULAR | Status: DC | PRN
  Administered 2019-11-03 – 2019-11-05 (×2): 1 mg via INTRAVENOUS
  Filled 2019-11-03 (×2): qty 1

## 2019-11-03 MED ORDER — PANTOPRAZOLE SODIUM 40 MG PO TBEC
40.0000 mg | DELAYED_RELEASE_TABLET | Freq: Every day | ORAL | Status: DC
  Administered 2019-11-04 – 2019-11-05 (×2): 40 mg via ORAL
  Filled 2019-11-03 (×2): qty 1

## 2019-11-03 MED ORDER — OXYCODONE HCL 5 MG PO TABS
5.0000 mg | ORAL_TABLET | ORAL | Status: DC | PRN
  Administered 2019-11-03 – 2019-11-04 (×3): 5 mg via ORAL
  Filled 2019-11-03 (×3): qty 1

## 2019-11-03 NOTE — Progress Notes (Addendum)
[name], MRN:  [id number], DOB:  25-Jan-1986, LOS: 7 ADMISSION DATE:  10/27/2019, CONSULTATION DATE:  8/25 REFERRING MD:  Margot Chimes, CHIEF COMPLAINT:  Acute resp failure   Brief History   72 yowf smoker with h/o asthma with 4 days of worse sob PTA assoc dry cough ? Fever/multiple chills presented with sats in 60s improved on 02 initially then req intubation 8/25 and PCCM asked to eval at that point  Patient got Pfizer Covid vaccine 2 doses in March.     Past Medical History  Asthma SZ   Significant Hospital Events   8/26 >> heparin stopped due to drop in plts, restarted 8/28  Consults:  PCCM 8/25  Procedures:  Oral ET  8/25 >>> 8/31  Significant Diagnostic Tests:  CTa 8/25 >>> 1. Small nonocclusive pulmonary emboli in the lungs bilaterally, as above. The appearance of the lungs is highly concerning for severe multilobar bilateral pneumonia Echo 8/25 1. Left ventricular ejection fraction, by estimation, is 55 to 60%.  2. Right ventricular systolic function is moderately reduced. The right  ventricular size is severely enlarged. There is moderately elevated  pulmonary artery systolic pressure.   Venous dopplers 8/25 neg    Micro Data:  BC x 2  8/24 >> ng MRSA PCR  8/24  Covid 19 PCR  8/24  And 8/26  Neg x 3  PCT   8/24    2.08  Urine legionella   8/25  Neg  Urine Strep  8/25  Neg  resp 8/25  >> nml flora   Antimicrobials:  Vanc 8/24  Rocephin 8/24 >>> 8/27 Zmax 8/24 >>> 8/27  Doxy 8/27 >>> 8/30  Meropenem 8/27 >>>    Scheduled Meds: . chlorhexidine gluconate (MEDLINE KIT)  15 mL Mouth Rinse BID  . Chlorhexidine Gluconate Cloth  6 each Topical Daily  . cloNIDine  0.1 mg Per Tube TID  . docusate  100 mg Per Tube BID  . feeding supplement (PROSource TF)  45 mL Per Tube TID  . feeding supplement (VITAL AF 1.2 CAL)  1,000 mL Per Tube Q24H  . folic acid  1 mg Oral Daily  . ipratropium-albuterol  3 mL Nebulization Q4H  . mouth rinse  15 mL  Mouth Rinse 10 times per day  . methylPREDNISolone (SOLU-MEDROL) injection  40 mg Intravenous Daily  . multivitamin with minerals  1 tablet Oral Daily  . pantoprazole sodium  40 mg Per Tube Q1200  . rocuronium  50 mg Intravenous Once  . sodium chloride flush  3 mL Intravenous Q12H  . thiamine  100 mg Oral Daily   Or  . thiamine  100 mg Intravenous Daily   Continuous Infusions: . sodium chloride    . dexmedetomidine (PRECEDEX) IV infusion 1 mcg/kg/hr (11/03/19 1315)  . heparin 1,650 Units/hr (11/03/19 0610)  . meropenem (MERREM) IV 1 g (11/03/19 0930)   PRN Meds:.sodium chloride, acetaminophen **OR** acetaminophen, HYDROmorphone (DILAUDID) injection, midazolam, ondansetron **OR** ondansetron (ZOFRAN) IV, sodium chloride flush     Interim history/subjective:   Remains critically ill, intubated Agitation persists in spite of adding Precedex to propofol and Dilaudid drips Afebrile Good urine output   Objective   Blood pressure (!) 121/47, pulse 75, temperature 99.6 F (37.6 C), temperature source Axillary, resp. rate (!) 37, height 5' 8"  (1.727 m), weight 68 kg, last menstrual period 10/26/2019, SpO2 100 %.    Vent Mode: PSV FiO2 (%):  [40 %] 40 % Set Rate:  [24 bmp]  24 bmp Vt Set:  [540 mL] 540 mL PEEP:  [5 cmH20] 5 cmH20 Pressure Support:  [10 cmH20-14 cmH20] 10 cmH20 Plateau Pressure:  [20 cmH20] 20 cmH20   Intake/Output Summary (Last 24 hours) at 11/03/2019 1346 Last data filed at 11/03/2019 1218 Gross per 24 hour  Intake 2393.73 ml  Output 3125 ml  Net -731.27 ml   Filed Weights   10/31/19 0436 11/01/19 0300 11/02/19 0331  Weight: 70.4 kg 66.5 kg 68 kg    Examination: Gen:      Young woman, intubated, no distress  HEENT:  EOMI, sclera anicteric, mild pallor , oral ETT Neck:     No JVD; no thyromegaly Lungs:    BL ventilated BS CV:         Regular rate and rhythm; no murmurs Abd:      + bowel sounds; soft, non-tender; no palpable masses, no distension Ext:    No  edema; adequate peripheral perfusion Skin:      Warm and dry; no rash Neuro: Awake, able to follow commands, RASS +2 to 0     CXR 8/30 personally reviewed, improvement  Labs show persistent hypokalemia, mild leukocytosis, stable anemia    Resolved Hospital Problem list    Thromocytopenia   Assessment & Plan:  1)  Acute CAP in pt with Asthma/ active wheezing  DD includes heroin induced non-cardiogenic edema - ct meropenem for total of 10 days of antibiotics -dc doxy   2) Acute vent dep resp failure secondary to cap/asthma -Patient was tolerating pressure support 10/5 with tidal volumes in the 400 range, sedation was quickly turned off and she was extubated, seems to be tolerating well -Can DC Solu-Medrol once bronchospasm subsides   3) Multiple PE not typically assoc with ards pattern x in pts with collagen vasc dz eg lupus / Antiphospholipid Syndrome (APS)  -  venous dopplers  Neg 8/25 - Echo suggests PE may not be  acute but definitely affecting RH function  - ct heparin , eventually transition to [place] for 6 months    4) Acute encephalopathy - ? Heroin withdrawal contributing -DC Dilaudid and propofol drips, high triglycerides related to propofol and does not need treatment once this was stopped -Taper Precedex to off  -Watch for opiate withdrawal    5) Anemia   Lab Results  Component Value Date   HGB 7.9 (L) 11/03/2019   HGB 8.1 (L) 11/02/2019   HGB 7.8 (L) 11/01/2019    C/w critical ilness ? Some alveolar hemmorrhage as well as no other obvious bleeding source Rec > tx for  hgb < 7 unless hemodynamically unstable   Hypomagnesemia/hypokalemia -replete again today  Best practice:  Diet:npo Pain/Anxiety/Delirium protocol (if indicated): precedex  VAP protocol (if indicated):  DVT prophylaxis: hep drip  GI prophylaxis: ppi Glucose control:  Mobility: bed rest Code Status: full code  Family Communication: Mother at bedside Disposition: ICU  Labs    CBC: Recent Labs  Lab 10/27/19 1438 10/28/19 0331 10/30/19 1331 10/31/19 0419 11/01/19 0623 11/02/19 0713 11/03/19 0219  WBC 13.8*   < > 10.1 7.7 9.4 10.6* 11.1*  NEUTROABS 12.7*  --   --   --   --   --   --   HGB 10.3*   < > 7.5* 7.8* 7.8* 8.1* 7.9*  HCT 31.3*   < > 24.1* 25.3* 24.9* 24.7* 25.7*  MCV 83.5   < > 87.0 87.8 88.6 88.8 88.6  PLT 217   < > 45*  62* 119* 223 258   < > = values in this interval not displayed.    Basic Metabolic Panel: Recent Labs  Lab 10/27/19 1438 10/27/19 1438 10/28/19 0331 10/28/19 0941 10/30/19 0354 10/30/19 1139 10/30/19 1331 10/30/19 1638 10/31/19 0419 10/31/19 1644 11/01/19 0623 11/02/19 0713 11/03/19 0219  NA 135   < > 138   < >   < >  --  141  --  141  --  143 140 141  K 2.7*   < > 4.0   < >   < >  --  3.1*  --  3.8  --  2.8* 2.7* 3.1*  CL 106   < > 112*   < >   < >  --  112*  --  109  --  107 104 103  CO2 16*   < > 15*   < >   < >  --  23  --  24  --  25 27 28   GLUCOSE 96   < > 125*   < >   < >  --  101*  --  110*  --  106* 96 104*  BUN 19   < > 18   < >   < >  --  9  --  10  --  13 13 14   CREATININE 0.89   < > 0.66   < >   < >  --  0.53  --  0.42*  --  0.37* 0.35* 0.35*  CALCIUM 8.0*   < > 7.7*   < >   < >  --  7.5*  --  7.6*  --  7.6* 7.2* 7.7*  MG 2.0  --  2.6*  --   --  1.9  --   --   --   --   --  1.6*  --   PHOS  --   --  2.4*  --   --  1.2*  --  1.7* 2.7 2.5  --   --   --    < > = values in this interval not displayed.   GFR: Estimated Creatinine Clearance: 100 mL/min (A) (by C-G formula based on SCr of 0.35 mg/dL (L)). Recent Labs  Lab 10/27/19 1438 10/28/19 0331 10/29/19 1157 10/29/19 1337 10/30/19 0354 10/30/19 1331 10/31/19 0419 11/01/19 0623 11/02/19 0713 11/03/19 0219  PROCALCITON 2.08  --   --  0.81 4.68  --  0.21  --   --   --   WBC 13.8*   < >   < >  --  10.4   < > 7.7 9.4 10.6* 11.1*  LATICACIDVEN 1.3  --   --   --   --   --   --   --   --   --    < > = values in this interval not displayed.     Liver Function Tests: Recent Labs  Lab 10/28/19 0331 10/28/19 0941 10/29/19 0408 10/30/19 0354 10/30/19 1331  AST 26 22 123* 32 34  ALT 16 15 59* 43 43  ALKPHOS 162* 150* 130* 97 92  BILITOT 0.8 0.8 0.6 0.4 0.6  PROT 6.2* 5.7* 5.3* 5.1* 4.7*  ALBUMIN 2.9* 2.7* 2.6* 2.6* 2.4*   No results for input(s): LIPASE, AMYLASE in the last 168 hours. No results for input(s): AMMONIA in the last 168 hours.  ABG    Component Value Date/Time   PHART 7.471 (H) 10/31/2019 0400  PCO2ART 37.5 10/31/2019 0400   PO2ART 142 (H) 10/31/2019 0400   HCO3 27.7 10/31/2019 0400   ACIDBASEDEF 6.2 (H) 10/29/2019 0340   O2SAT 98.6 10/31/2019 0400     Coagulation Profile: Recent Labs  Lab 10/30/19 0354 10/30/19 1331 10/31/19 0419 11/01/19 0623 11/02/19 0713  INR 1.4* 1.4* 1.3* 1.2 1.1    Cardiac Enzymes: No results for input(s): CKTOTAL, CKMB, CKMBINDEX, TROPONINI in the last 168 hours.  HbA1C: No results found for: HGBA1C  CBG: Recent Labs  Lab 11/02/19 2018 11/03/19 0026 11/03/19 0510 11/03/19 0754 11/03/19 1158  GLUCAP 110* 107* 117* 110* 123*    The patient is critically ill with multiple organ systems failure and requires high complexity decision making for assessment and support, frequent evaluation and titration of therapies, application of advanced monitoring technologies and extensive interpretation of multiple databases. Critical Care Time devoted to patient care services described in this note independent of APP/resident  time is 32 minutes.    Kara Mead MD. Shade Flood. [facility]  If no response to pager , please call [phone]  After 7:00 pm call Elink  [phone]   11/03/2019

## 2019-11-03 NOTE — Plan of Care (Addendum)
  Problem: Acute Rehab PT Goals(only PT should resolve) Goal: Pt Will Go Supine/Side To Sit Outcome: Progressing Flowsheets (Taken 11/03/2019 1528) Pt will go Supine/Side to Sit: Independently Goal: Patient Will Transfer Sit To/From Stand Outcome: Progressing Flowsheets (Taken 11/03/2019 1528) Patient will transfer sit to/from stand: with min guard assist Goal: Pt Will Transfer Bed To Chair/Chair To Bed Outcome: Progressing Flowsheets (Taken 11/03/2019 1528) Pt will Transfer Bed to Chair/Chair to Bed: with min assist Goal: Pt Will Ambulate Outcome: Progressing Flowsheets (Taken 11/03/2019 1528) Pt will Ambulate: . 15 feet . with minimal assist . with rolling walker   3:29 PM , 11/03/19 Lorin Picket, SPT Physical Therapy with [place]  [facility] [phone] office   During this treatment session, the therapist was present, participating in and directing the treatment.  3:31 PM, 11/03/19 Ocie Bob, MPT Physical Therapist with [facility] [phone] office [phone] mobile phone

## 2019-11-03 NOTE — Procedures (Signed)
Extubation Procedure Note  Patient Details:   Name: Diane Norton DOB: Aug 01, 1985 MRN: [id number]   Airway Documentation:  Airway 7.5 mm (Active)  Secured at (cm) 23 cm 11/03/19 0955  Measured From Lips 11/03/19 0955  Secured Location Left 11/03/19 0955  Secured By [employer] 11/03/19 0955  Tube Holder Repositioned Yes 11/03/19 0955  Cuff Pressure (cm H2O) 28 cm H2O 11/03/19 0955  Site Condition Dry 11/02/19 0900   Pt extubated at 1350 on 8/31.  Evaluation  O2 sats: 92-94% Complications: none noted Patient did tolerate procedure well. BBS noted to be clear    Pt able to speak coarsely. No noted issues.   Lucia Gaskins 11/03/2019, 5:10 PM

## 2019-11-03 NOTE — Evaluation (Addendum)
Physical Therapy Evaluation Patient Details Name: Diane Norton MRN: [id number] DOB: Feb 27, 1986 Today's Date: 11/03/2019   History of Present Illness  Diane Norton is a 34 y.o. female with medical history significant for asthma, seizures, tobacco abuse.  Patient was brought to the ED via EMS for reports of increasing difficulty breathing over the past 4 days.  She also reports a dry cough.  Also reported possible fevers and chills.  She smokes 2 pack of cigarettes daily.She denies chest pain, no leg swelling, no recent trips.  Patient got Pfizer Covid vaccine 2 doses in March.  No known sick contacts.  No vomiting.  She reports some mild wheezing.  She has never required intubation for asthma before, or been this ill before.On EMS arrival, patient's O2 sats was 65%, she did not appear to be in any distress.    Clinical Impression  The patient's O2 sat on 2L [place] was 96% at rest today, and 100% on RA at rest. During ambulation the patient's O2 saturation decreased to 84% on RA, during which time 2L O2 [place] was put back on. O2 dropped to 75% and then promptly rose back up to 92% once back in supine. The patient was able to perform supine to sit transfer min guard, but required min assist in order to maintain sitting balance EOB. She required assistance to carry out task of putting on hospital socks and had extensive VC's to attempt to open her eyes fully. The patient completed sit to stand transfer min assist, but required mod assist to maintain standing balance and was only able to take a few side steps before needing to sit back down due to O2 desaturation and LOB. The patient was left in bed with bed alarm set after therapy- RN notified. PLAN: The patient will continue to benefit from skilled physical therapy services in hospital at recommended venue below in order to improve balance, gait, and ADL's to promote independence in functional activities.     Follow Up Recommendations  SNF;Supervision/Assistance - 24 hour    Equipment Recommendations  Rolling walker with 5" wheels    Recommendations for Other Services       Precautions / Restrictions Precautions Precautions: Fall Restrictions Weight Bearing Restrictions: No      Mobility  Bed Mobility Overal bed mobility: Needs Assistance Bed Mobility: Supine to Sit     Supine to sit: Min guard     General bed mobility comments: able to perform transfer but balance deficit when attempting to maintain position  Transfers Overall transfer level: Needs assistance Equipment used: Rolling walker (2 wheeled) Transfers: Sit to/from Stand Sit to Stand: Min assist         General transfer comment: unsteady on feet, limited to a few side steps, increased sway upon standing  Ambulation/Gait Ambulation/Gait assistance: Min assist Gait Distance (Feet): 4 Feet Assistive device: Rolling walker (2 wheeled) Gait Pattern/deviations: Step-to pattern;Decreased step length - right;Decreased step length - left     General Gait Details: LOB in all directions during ambulation  Stairs            Wheelchair Mobility    Modified Rankin (Stroke Patients Only)       Balance Overall balance assessment: Needs assistance Sitting-balance support: Bilateral upper extremity supported;Feet supported Sitting balance-Leahy Scale: Poor Sitting balance - Comments: frequent TC's in order to maintain upright position, pt unable to complete task of putting on socks sitting EOB   Standing balance support: Bilateral upper extremity supported;During functional activity  Standing balance-Leahy Scale: Poor Standing balance comment: min/mod assist in order to correct LOB in all directions, pt did not fully open eyes                             Pertinent Vitals/Pain Pain Assessment: Faces Faces Pain Scale: Hurts a little bit Pain Location: PICC line Pain Descriptors / Indicators: Discomfort Pain  Intervention(s): Monitored during session    Home Living Family/patient expects to be discharged to:: Private residence Living Arrangements: Parent;Other (Comment) (mother and mother's boyfriend) Available Help at Discharge: Family;Available PRN/intermittently Type of Home: Mobile home Home Access: Stairs to enter Entrance Stairs-Rails: Can reach both;Right;Left Entrance Stairs-Number of Steps: 3 Home Layout: One level Home Equipment: None Additional Comments: community ambulator    Prior Function Level of Independence: Independent         Comments: independent with driving, shopping, all ADL's     Hand Dominance   Dominant Hand: Right    Extremity/Trunk Assessment   Upper Extremity Assessment Upper Extremity Assessment: Overall WFL for tasks assessed    Lower Extremity Assessment Lower Extremity Assessment: Overall WFL for tasks assessed    Cervical / Trunk Assessment Cervical / Trunk Assessment: Normal  Communication   Communication: No difficulties  Cognition Arousal/Alertness: Lethargic (possibly due to history of drug use) Behavior During Therapy: Impulsive;WFL for tasks assessed/performed Overall Cognitive Status: Within Functional Limits for tasks assessed                                        General Comments      Exercises     Assessment/Plan    PT Assessment Patient needs continued PT services  PT Problem List Decreased strength;Decreased mobility;Decreased range of motion;Decreased coordination;Decreased activity tolerance;Cardiopulmonary status limiting activity;Decreased knowledge of use of DME;Decreased balance       PT Treatment Interventions DME instruction;Therapeutic exercise;Gait training;Balance training;Functional mobility training;Therapeutic activities;Patient/family education    PT Goals (Current goals can be found in the Care Plan section)  Acute Rehab PT Goals Patient Stated Goal: go home PT Goal Formulation:  With patient Time For Goal Achievement: 11/17/19 Potential to Achieve Goals: Fair    Frequency Min 3X/week   Barriers to discharge        Co-evaluation               AM-PAC PT "6 Clicks" Mobility  Outcome Measure   Help needed moving from lying on your back to sitting on the side of a flat bed without using bedrails?: A Little Help needed moving to and from a bed to a chair (including a wheelchair)?: A Lot Help needed standing up from a chair using your arms (e.g., wheelchair or bedside chair)?: A Little Help needed to walk in hospital room?: A Lot Help needed climbing 3-5 steps with a railing? : A Lot 6 Click Score: 12    End of Session Equipment Utilized During Treatment: Gait belt;Oxygen Activity Tolerance: Patient limited by fatigue;Patient limited by lethargy Patient left: in bed;with call bell/phone within reach;with bed alarm set Nurse Communication: Mobility status PT Visit Diagnosis: Unsteadiness on feet (R26.81);Other abnormalities of gait and mobility (R26.89);Muscle weakness (generalized) (M62.81);Apraxia (R48.2)    Time: 8828-0034 PT Time Calculation (min) (ACUTE ONLY): 26 min   Charges:   PT Evaluation $PT Eval Moderate Complexity: 1 Mod PT Treatments $Therapeutic Activity: 23-37 mins  3:30 PM , 11/03/19 Lorin Picket, SPT Physical Therapy with [place]  [facility] [phone] office   During this treatment session, the therapist was present, participating in and directing the treatment.  3:30 PM, 11/03/19 Ocie Bob, MPT Physical Therapist with [facility] [phone] office [phone] mobile phone

## 2019-11-03 NOTE — Progress Notes (Addendum)
Patient Demographics:    Diane Norton, is a 34 y.o. female, DOB - Aug 24, 1985, QDI:[id number]  Admit date - 10/27/2019   Admitting Physician Jamillah Camilo Denton Brick, MD  Outpatient Primary MD for the patient is Patient, No Pcp Per  LOS - 7   Chief Complaint  Patient presents with  . Shortness of Breath        Subjective:    Diane Norton   --Precedex drip and propofol drip discontinued Did well with sedation vacation, did well with weaning trial, -Extubated 11/03/2019  -Physical therapy evaluation appreciated   Assessment  & Plan :    Principal Problem:   Acute Hypoxic respiratory failure ([place]) Active Problems:   DIC (disseminated intravascular coagulation) ([place])   Acute pulmonary embolism (H[facility])   Asthma   Multifocal pneumonia   History of seizures   PNA (pneumonia)   Thrombocytopenia ([place])  Brief summary 34 year old Caucasian female with past medical history relevant for ongoing heroin abuse, 2 pack-a-day smoker with COPD/emphysema, history of underlying asthma, history of seizures with no recent seizure episodes and 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, 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 admitted on 10/27/2019 with hypoxic respiratory failure secondary to combination of to bilateral nonocclusive PE, bilateral multifocal pneumonia and COPD exacerbation--- hypoxic respiratory failure worsened, patient was confused and not compliant with oxygen therapy, intubated 10/28/2019 -Currently on IV propofol and IV dilaudid drips --- ADAMTs 13 panel is 59.9 %--- which would be inconsistent with TTP, this is more consistent with DIC   A/p 1)Acute hypoxic respiratory failure----secondary to bilateral pneumonia, COPD/emphysema flareup and bilateral nonocclusive PE---- -Extubated 11/03/2019 --Respiratory failure and hypoxia worsened, discussed with EDP Dr. Reather Converse, decision made to intubate on 10/28/19 -completed COVID-19  vaccination [employer]) in  April 2021 -COVID-19 test negative x 3 times  this admission -Stop IV Solu-Medrol okay to use p.o. prednisone -P chest x-ray 11/04/2019  2) bilateral multifocal pneumonia---- intubated for hypoxic respiratory failure as above #1 -Extubated 11/03/2019 --Bronchodilators, -Strep pneumo negative, -Legionella neg PCT 2.08 >>0.81>>4.68---  ---stopped azithromycin and Rocephin on 10/29/2019  --started on meropenem and doxycycline on 10/29/19  3) acute COPD/emphysema exacerbation--- secondary to #2 above, contributing to #1 above --Patient is a 2 pack-a-day smoker -Improved on IV Solu-Medrol and bronchodilators as ordered -Stop IV Solu-Medrol use p.o. prednisone for additional couple days  4) heroin addiction--- patient uses heroin on admission at least $50 worth of heroin on a daily basis, she does not inject apparently she snorts heroin -As needed lorazepam and as needed Dilaudid -Precedex drip and propofol drip discontinued  5)Bilateral nonocclusive pulmonary embolism--- patient received therapeutic dose of Lovenox on 10/27/2019 --Started IV heparin drip on 10/28/2019, discontinued IV heparin on 10/29/2019 due to worsening thrombocytopenia -- given improving fibrinogen, platelet count and improving DIC  profile  -- restarted IV heparin for PE without bolus on 10/31/2019 -Echo with EF of 55 to 60%, with severe right ventricular enlargement and moderate pulmonary hypertension -Lower extremity venous Dopplers negative  6)Social/Ethics---Spoke with Brother --- Bronson Ing at [phone] -- Brother unable to visit as he was dxed with covid PNA on 10/10/19 and he was just dced from hospital last week  Pt's brother stated that Diane Norton was not around him recently (not exposed to him while he had covid) --  Spoke with Mother---Diane Norton [phone] Updated Both brother and mother----  7) history of seizures--no recent seizures, currently intubated and sedated with propofol along  with as needed lorazepam  8) acute on chronic anemia and acute thrombocytopenia/DIC--- on admission on 10/27/2019 platelet count was 217, patient received Lovenox 80 mg subcu x1 on 10/27/2019 -Patient was started on IV heparin for PE on 10/28/2019 in a.m. -patient received therapeutic dose of Lovenox on 10/27/2019 --Started IV heparin drip on 10/28/2019 in am , discontinued IV heparin on 10/29/2019 due to worsening thrombocytopenia -- given improving fibrinogen, platelet count and improving DIC  profile  -- restarted IV heparin for PE without bolus on 10/31/2019 Platelets 217>>142>>116>>39>>37>>38>>45>>61>>119>>223 Hgb 10.8 >>> 8.8 >>7.8>>7.5>.7.8 LDH 664>>594>>532>> 523>>421>>380 Fibrinogen  594>>79>>127>>>175>>213>>349 inr 1.5>>>1.4>>1.3>>1.2>>1.1 -- hematology consult from Dr. Delton Coombes appreciated -No obvious bleeding at this time ----HIT and DIC panel results reviewed with Dr. Delton Coombes --DIC is more likely than HIT given the clinical presentation and lab findings -He advised stopping IV heparin, he advised against argatroban -- we gave FFP and cryoprecipitate 10/29/19 to reduce bleeding risk -Vitamin K given on 10/30/2019 --Since DIC may be triggered by antibiotics/medications--we will stop azithromycin and Rocephin which was started on admission and instead use meropenem and doxycycline -Patient has multiple allergies limiting choices of antibiotics -- ADAMTs 13 panel is 59.9 --- which would be inconsistent with TTP, this is more consistent with DIC --- I called [facility] hematology transfer line----[facility] declined to accept patient for transfer due to lack of beds -Overall DIC pathophysiology appears to be improving/resolving at this time  9)Prophylaxis--Protonix for GI prophylaxis and , IV heparin for PE  10)FEN/hypokalemia/hypomagnesemia and hypophosphatemia--replace potassium, magnesium and phosphorous ---dietitian consult for tube feeding  -Tolerating vital 1.2 tube feeds at  40 mL an hour -Stop tube feeding, okay to give full liquid diet orally  11) generalized weakness and deconditioning--- physical therapist recommend SNF rehab, case management/discharge planner notified  CRITICAL CARE Performed by: Roxan Hockey   Total critical care time: 41 minutes  Critical care time was exclusive of separately billable procedures and treating other patients.  --Extubated 11/03/2019  Critical care was necessary to treat or prevent imminent or life-threatening deterioration.  Critical care was time spent personally by me on the following activities: development of treatment plan with patient and/or surrogate as well as nursing, discussions with consultants, evaluation of patient's response to treatment, examination of patient, obtaining history from patient or surrogate, ordering and performing treatments and interventions, ordering and review of laboratory studies, ordering and review of radiographic studies, pulse oximetry and re-evaluation of patient's condition.   Disposition/Need for in-Hospital Stay- patient unable to be discharged at this time due to --- -pulmonary embolism requiring IV heparin, generalized weakness and deconditioning requiring SNF placement for rehab  Status is: Inpatient  Remains inpatient appropriate because:pulmonary embolism requiring IV heparin, generalized weakness and deconditioning requiring SNF placement for rehab   Disposition: The patient is from: Home              Anticipated d/c is to: TBD              Anticipated d/c date is: > 3 days              Patient currently is not medically stable to d/c. Barriers: Not Clinically Stable-  -pulmonary embolism requiring IV heparin, generalized weakness and deconditioning requiring SNF placement for rehab  Code Status : Full  Family Communication:   -  D/w patient's mother Diane  Evelene Croon  -and pt Brother Diane Norton at [phone]  Consults  :  PCCM/hematology  DVT Prophylaxis  :  IV heparin  Lab Results  Component Value Date   PLT 258 11/03/2019    Inpatient Medications  Scheduled Meds: . chlorhexidine gluconate (MEDLINE KIT)  15 mL Mouth Rinse BID  . Chlorhexidine Gluconate Cloth  6 each Topical Daily  . folic acid  1 mg Oral Daily  . ipratropium-albuterol  3 mL Nebulization TID  . mouth rinse  15 mL Mouth Rinse 10 times per day  . multivitamin with minerals  1 tablet Oral Daily  . [START ON 11/04/2019] pantoprazole  40 mg Oral Daily  . [START ON 11/04/2019] predniSONE  20 mg Oral Q breakfast  . sodium chloride flush  3 mL Intravenous Q12H  . thiamine  100 mg Oral Daily   Or  . thiamine  100 mg Intravenous Daily  . traZODone  150 mg Oral QHS   Continuous Infusions: . sodium chloride    . dexmedetomidine (PRECEDEX) IV infusion 1 mcg/kg/hr (11/03/19 1315)  . heparin 1,650 Units/hr (11/03/19 0610)  . meropenem (MERREM) IV 1 g (11/03/19 0930)   PRN Meds:.sodium chloride, acetaminophen **OR** acetaminophen, albuterol, HYDROmorphone (DILAUDID) injection, LORazepam, ondansetron **OR** ondansetron (ZOFRAN) IV, oxyCODONE, sodium chloride flush    Anti-infectives (From admission, onward)   Start     Dose/Rate Route Frequency Ordered Stop   10/29/19 1700  meropenem (MERREM) 1 g in sodium chloride 0.9 % 100 mL IVPB        1 g 200 mL/hr over 30 Minutes Intravenous Every 8 hours 10/29/19 1557     10/29/19 1600  doxycycline (VIBRAMYCIN) 100 mg in sodium chloride 0.9 % 250 mL IVPB  Status:  Discontinued        100 mg 125 mL/hr over 120 Minutes Intravenous Every 12 hours 10/29/19 1536 11/02/19 1322   10/28/19 1800  vancomycin (VANCOCIN) IVPB 1000 mg/200 mL premix  Status:  Discontinued        1,000 mg 200 mL/hr over 60 Minutes Intravenous Every 24 hours 10/27/19 1645 10/27/19 1956   10/28/19 1600  azithromycin (ZITHROMAX) 500 mg in sodium chloride 0.9 % 250 mL IVPB  Status:  Discontinued        500 mg 250 mL/hr over 60 Minutes Intravenous Every 24 hours 10/27/19  1956 10/29/19 1536   10/28/19 1600  cefTRIAXone (ROCEPHIN) 1 g in sodium chloride 0.9 % 100 mL IVPB  Status:  Discontinued        1 g 200 mL/hr over 30 Minutes Intravenous Every 24 hours 10/27/19 1956 10/29/19 1536   10/27/19 1645  cefTRIAXone (ROCEPHIN) 1 g in sodium chloride 0.9 % 100 mL IVPB        1 g 200 mL/hr over 30 Minutes Intravenous  Once 10/27/19 1630 10/27/19 1826   10/27/19 1645  azithromycin (ZITHROMAX) 500 mg in sodium chloride 0.9 % 250 mL IVPB        500 mg 250 mL/hr over 60 Minutes Intravenous  Once 10/27/19 1630 10/27/19 1957   10/27/19 1645  vancomycin (VANCOREADY) IVPB 1250 mg/250 mL        1,250 mg 166.7 mL/hr over 90 Minutes Intravenous  Once 10/27/19 1638 10/27/19 2015        Objective:   Vitals:   11/03/19 0600 11/03/19 0700 11/03/19 0800 11/03/19 1300  BP: (!) 115/53 (!) 112/58 (!) 103/43 (!) 121/47  Pulse: 66 64 75   Resp: (!) 25 (!) 22 (!)  25 (!) 37  Temp:   99.6 F (37.6 C)   TempSrc:   Axillary   SpO2: 100% 100% 100%   Weight:      Height:        Wt Readings from Last 3 Encounters:  11/02/19 68 kg  01/11/19 59 kg  01/09/15 65.8 kg     Intake/Output Summary (Last 24 hours) at 11/03/2019 1624 Last data filed at 11/03/2019 1218 [place] per 24 hour  Intake 1556.94 ml  Output 3125 ml  Net -1568.06 ml    Physical Exam  Gen:-extubated, in no acute distress HEENT:-Sclera nonicteric Neck-supple, no JVD,.  Lungs--improved air movement, no wheezing CV- S1, S2 normal, regular  Abd-  +ve B.Sounds, Abd Soft, ND Extremity/Skin:- No  edema, pedal pulses present NeuroPsych--restless at times but redirectable  GU --- Foley with clear urine   Data Review:   Micro Results Recent Results (from the past 240 hour(s))  Blood Culture (routine x 2)     Status: None   Collection Time: 10/27/19  1:50 PM   Specimen: BLOOD LEFT HAND  Result Value Ref Range Status   Specimen Description BLOOD LEFT HAND  Final   Special Requests   Final    BOTTLES DRAWN  AEROBIC AND ANAEROBIC Blood Culture results may not be optimal due to an inadequate volume of blood received in culture bottles   Culture   Final    NO GROWTH 5 DAYS Performed at [facility], [address] [place], [place] [address]    Report Status 11/01/2019 FINAL  Final  SARS Coronavirus 2 by RT PCR (hospital order, performed in [place] hospital lab) Nasopharyngeal Nasopharyngeal Swab     Status: None   Collection Time: 10/27/19  2:14 PM   Specimen: Nasopharyngeal Swab  Result Value Ref Range Status   SARS Coronavirus 2 NEGATIVE NEGATIVE Final    Comment: (NOTE) SARS-CoV-2 target nucleic acids are NOT DETECTED.  The SARS-CoV-2 RNA is generally detectable in upper and lower respiratory specimens during the acute phase of infection. The lowest concentration of SARS-CoV-2 viral copies this assay can detect is 250 copies / mL. A negative result does not preclude SARS-CoV-2 infection and should not be used as the sole basis for treatment or other patient management decisions.  A negative result may occur with improper specimen collection / handling, submission of specimen other than nasopharyngeal swab, presence of viral mutation(s) within the areas targeted by this assay, and inadequate number of viral copies (<250 copies / mL). A negative result must be combined with clinical observations, patient history, and epidemiological information.  Fact Sheet for Patients:   StrictlyIdeas.no  Fact Sheet for Healthcare Providers: BankingDealers.co.za  This test is not yet approved or  cleared by the [place] FDA and has been authorized for detection and/or diagnosis of SARS-CoV-2 by FDA under an Emergency Use Authorization (EUA).  This EUA will remain in effect (meaning this test can be used) for the duration of the COVID-19 declaration under Section 564(b)(1) of the Act, 21 U.S.C. section 360bbb-3(b)(1), unless the authorization is  terminated or revoked sooner.  Performed at [facility], [address], [place], [place] [address]   Blood Culture (routine x 2)     Status: None   Collection Time: 10/27/19  2:39 PM   Specimen: BLOOD RIGHT HAND  Result Value Ref Range Status   Specimen Description BLOOD RIGHT HAND  Final   Special Requests   Final    BOTTLES DRAWN AEROBIC AND ANAEROBIC  Blood Culture adequate volume   Culture   Final    NO GROWTH 5 DAYS Performed at [facility], [address], [place], [place] [address]    Report Status 11/01/2019 FINAL  Final  MRSA PCR Screening     Status: None   Collection Time: 10/27/19  9:30 PM   Specimen: Nasopharyngeal  Result Value Ref Range Status   MRSA by PCR NEGATIVE NEGATIVE Final    Comment:        The GeneXpert MRSA Assay (FDA approved for NASAL specimens only), is one component of a comprehensive MRSA colonization surveillance program. It is not intended to diagnose MRSA infection nor to guide or monitor treatment for MRSA infections. Performed at [facility], [address], [place], [place] [address]   SARS Coronavirus 2 by RT PCR (hospital order, performed in [facility] hospital lab) Nasopharyngeal     Status: None   Collection Time: 10/27/19  9:32 PM   Specimen: Nasopharyngeal  Result Value Ref Range Status   SARS Coronavirus 2 NEGATIVE NEGATIVE Final    Comment: (NOTE) SARS-CoV-2 target nucleic acids are NOT DETECTED.  The SARS-CoV-2 RNA is generally detectable in upper and lower respiratory specimens during the acute phase of infection. The lowest concentration of SARS-CoV-2 viral copies this assay can detect is 250 copies / mL. A negative result does not preclude SARS-CoV-2 infection and should not be used as the sole basis for treatment or other patient management decisions.  A negative result may occur with improper specimen collection / handling, submission of specimen other than nasopharyngeal swab, presence of viral mutation(s) within  the areas targeted by this assay, and inadequate number of viral copies (<250 copies / mL). A negative result must be combined with clinical observations, patient history, and epidemiological information.  Fact Sheet for Patients:   StrictlyIdeas.no  Fact Sheet for Healthcare Providers: BankingDealers.co.za  This test is not yet approved or  cleared by the [place] FDA and has been authorized for detection and/or diagnosis of SARS-CoV-2 by FDA under an Emergency Use Authorization (EUA).  This EUA will remain in effect (meaning this test can be used) for the duration of the COVID-19 declaration under Section 564(b)(1) of the Act, 21 U.S.C. section 360bbb-3(b)(1), unless the authorization is terminated or revoked sooner.  Performed at [facility], [address] [place], [place] [address]   Culture, respiratory (non-expectorated)     Status: None   Collection Time: 10/28/19  1:56 PM   Specimen: Tracheal Aspirate; Respiratory  Result Value Ref Range Status   Specimen Description   Final    TRACHEAL ASPIRATE Performed at [facility], [address] [place], [place] [address]    Special Requests   Final    Normal Performed at [facility], [address], [place], [place] [address]    Gram Stain   Final    MODERATE WBC PRESENT, PREDOMINANTLY PMN RARE SQUAMOUS EPITHELIAL CELLS PRESENT ABUNDANT YEAST FEW GRAM POSITIVE COCCI    Culture   Final    RARE Normal respiratory flora-no Staph aureus or Pseudomonas seen Performed at [facility] [address], [place], [place] [address]    Report Status 10/31/2019 FINAL  Final  SARS Coronavirus 2 by RT PCR (hospital order, performed in [facility] hospital lab) Nasopharyngeal Nasopharyngeal Swab     Status: None   Collection Time: 10/29/19  9:29 AM   Specimen: Nasopharyngeal Swab  Result Value Ref Range Status   SARS Coronavirus 2 NEGATIVE NEGATIVE  Final    Comment:  (NOTE) SARS-CoV-2 target nucleic acids are NOT DETECTED.  The SARS-CoV-2 RNA is generally detectable in upper and lower respiratory specimens during the acute phase of infection. The lowest concentration of SARS-CoV-2 viral copies this assay can detect is 250 copies / mL. A negative result does not preclude SARS-CoV-2 infection and should not be used as the sole basis for treatment or other patient management decisions.  A negative result may occur with improper specimen collection / handling, submission of specimen other than nasopharyngeal swab, presence of viral mutation(s) within the areas targeted by this assay, and inadequate number of viral copies (<250 copies / mL). A negative result must be combined with clinical observations, patient history, and epidemiological information.  Fact Sheet for Patients:   StrictlyIdeas.no  Fact Sheet for Healthcare Providers: BankingDealers.co.za  This test is not yet approved or  cleared by the [place] FDA and has been authorized for detection and/or diagnosis of SARS-CoV-2 by FDA under an Emergency Use Authorization (EUA).  This EUA will remain in effect (meaning this test can be used) for the duration of the COVID-19 declaration under Section 564(b)(1) of the Act, 21 U.S.C. section 360bbb-3(b)(1), unless the authorization is terminated or revoked sooner.  Performed at [facility], [address], [place], [place] [address]     Radiology Reports CT Angio Chest PE W and/or Wo Contrast  Result Date: 10/28/2019 CLINICAL DATA:  34 year old female with history of shortness of breath for the past 4 days. Fever. EXAM: CT ANGIOGRAPHY CHEST WITH CONTRAST TECHNIQUE: Multidetector CT imaging of the chest was performed using the standard protocol during bolus administration of intravenous contrast. Multiplanar CT image reconstructions and MIPs were obtained to evaluate the vascular anatomy.  CONTRAST:  41m OMNIPAQUE IOHEXOL 350 MG/ML SOLN COMPARISON:  Chest CT 01/09/2015. FINDINGS: Cardiovascular: Tiny nonobstructive filling defects are noted within subsegmental pulmonary artery branches to the right lower lobe (axial image 194 of series 5) and the left upper lobe (axial image 155 of series 5). No other larger central, lobar or segmental sized filling defects are noted. Heart size is normal. There is no significant pericardial fluid, thickening or pericardial calcification. No atherosclerotic calcifications in the thoracic aorta or coronary arteries. Mediastinum/Nodes: No pathologically enlarged mediastinal or hilar lymph nodes. Esophagus is unremarkable in appearance. No axillary lymphadenopathy. Lungs/Pleura: Widespread areas of severe ground-glass attenuation are noted scattered throughout the lungs bilaterally. No pleural effusions. Mild diffuse bronchial wall thickening with moderate centrilobular and paraseptal emphysema. Upper Abdomen: Unremarkable. Musculoskeletal: There are no aggressive appearing lytic or blastic lesions noted in the visualized portions of the skeleton. Review of the MIP images confirms the above findings. IMPRESSION: 1. Small nonocclusive pulmonary emboli in the lungs bilaterally, as above. 2. The appearance of the lungs is highly concerning for severe multilobar bilateral pneumonia, likely from atypical viral etiologies such as COVID infection. 3. Diffuse bronchial wall thickening with moderate centrilobular and paraseptal emphysema; imaging findings suggestive of underlying COPD. These results will be called to the ordering clinician or representative by the Radiologist Assistant, and communication documented in the PACS or [employer] Emphysema (ICD10-J43.9). Electronically Signed   By: DVinnie LangtonM.D.   On: 10/28/2019 09:37   [place]domen Limited  Result Date: 10/29/2019 CLINICAL DATA:  Elevated LFTs EXAM: ULTRASOUND ABDOMEN LIMITED RIGHT UPPER QUADRANT  COMPARISON:  January 11, 2019. FINDINGS: Gallbladder: Status post cholecystectomy. Common bile duct: Diameter: 2 mm, normal Liver: No focal lesion identified. Mildly heterogeneous liver echotexture. Portal vein  is patent on color Doppler imaging with normal direction of blood flow towards the liver. Other: None. IMPRESSION: Mildly heterogeneous liver echotexture without focal lesion identified. Electronically Signed   By: Valentino Saxon MD   On: 10/29/2019 09:36   US Venous Img Lower Bilateral (DVT)  Result Date: 10/28/2019 CLINICAL DATA:  Acute respiratory failure. Small volume bilateral pulmonary embolism. Evaluate for DVT. EXAM: BILATERAL LOWER EXTREMITY VENOUS DOPPLER ULTRASOUND TECHNIQUE: Gray-scale sonography with graded compression, as well as color Doppler and duplex ultrasound were performed to evaluate the lower extremity deep venous systems from the level of the common femoral vein and including the common femoral, femoral, profunda femoral, popliteal and calf veins including the posterior tibial, peroneal and gastrocnemius veins when visible. The superficial great saphenous vein was also interrogated. Spectral Doppler was utilized to evaluate flow at rest and with distal augmentation maneuvers in the common femoral, femoral and popliteal veins. COMPARISON:  None. FINDINGS: RIGHT LOWER EXTREMITY Common Femoral Vein: No evidence of thrombus. Normal compressibility, respiratory phasicity and response to augmentation. Saphenofemoral Junction: No evidence of thrombus. Normal compressibility and flow on color Doppler imaging. Profunda Femoral Vein: No evidence of thrombus. Normal compressibility and flow on color Doppler imaging. Femoral Vein: No evidence of thrombus. Normal compressibility, respiratory phasicity and response to augmentation. Popliteal Vein: No evidence of thrombus. Normal compressibility, respiratory phasicity and response to augmentation. Calf Veins: No evidence of thrombus. Normal  compressibility and flow on color Doppler imaging. Superficial Great Saphenous Vein: No evidence of thrombus. Normal compressibility. Venous Reflux:  None. Other Findings:  None. LEFT LOWER EXTREMITY Common Femoral Vein: No evidence of thrombus. Normal compressibility, respiratory phasicity and response to augmentation. Saphenofemoral Junction: No evidence of thrombus. Normal compressibility and flow on color Doppler imaging. Profunda Femoral Vein: No evidence of thrombus. Normal compressibility and flow on color Doppler imaging. Femoral Vein: No evidence of thrombus. Normal compressibility, respiratory phasicity and response to augmentation. Popliteal Vein: No evidence of thrombus. Normal compressibility, respiratory phasicity and response to augmentation. Calf Veins: No evidence of thrombus. Normal compressibility and flow on color Doppler imaging. Superficial Great Saphenous Vein: No evidence of thrombus. Normal compressibility. Venous Reflux:  None. Other Findings:  None. IMPRESSION: No evidence of DVT within either lower extremity. Electronically Signed   By: Sandi Mariscal M.D.   On: 10/28/2019 16:50   DG CHEST PORT 1 VIEW  Result Date: 11/02/2019 CLINICAL DATA:  Shortness of breath, productive cough. EXAM: PORTABLE CHEST 1 VIEW COMPARISON:  October 31, 2019. FINDINGS: The heart size and mediastinal contours are within normal limits. Endotracheal and nasogastric tubes are unchanged in position. No pneumothorax pleural effusion is noted. Right-sided PICC line is now noted with distal tip in expected position of cavoatrial junction. Mild reticular densities are noted throughout both lungs concerning for inflammation or possibly edema. The visualized skeletal structures are unremarkable. IMPRESSION: Interval placement of right-sided PICC line with distal tip in expected position of cavoatrial junction. Mild reticular densities are noted throughout both lungs concerning for inflammation or possibly edema.  Electronically Signed   By: Marijo Conception M.D.   On: 11/02/2019 12:43   DG CHEST PORT 1 VIEW  Result Date: 10/31/2019 CLINICAL DATA:  Evaluate OG tube placement EXAM: PORTABLE CHEST 1 VIEW COMPARISON:  October 31, 2019 FINDINGS: The OG tube terminates in the left side of the abdomen, within the stomach. Stable cardiomegaly. The hila and mediastinum are unchanged. Bilateral pulmonary opacities in the bases are similar to mildly worsened in the interval. No  other changes. IMPRESSION: 1. The OG tube terminates in the stomach. 2. Bibasilar infiltrates remain, stable to mildly worsened. 3. The ET tube is been removed in the interval. Electronically Signed   By: Dorise Bullion III M.D   On: 10/31/2019 16:10   DG CHEST PORT 1 VIEW  Result Date: 10/31/2019 CLINICAL DATA:  Pt. On a vent.   Acute hypoxic respiratory failure. EXAM: PORTABLE CHEST 1 VIEW COMPARISON:  Chest radiograph 10/29/2019 FINDINGS: Stable support apparatus. Unchanged cardiomediastinal contours. There is increased consolidation at the left lung base. Persistent airspace opacities in the right mid to lower lung. No pneumothorax. No definite effusion. No acute osseous finding. IMPRESSION: 1. Increasing consolidation at the left lung base. 2. Persistent airspace opacities in the right mid to lower lung. Electronically Signed   By: Audie Pinto M.D.   On: 10/31/2019 13:27   DG CHEST PORT 1 VIEW  Result Date: 10/29/2019 CLINICAL DATA:  34 year old female with respiratory failure hypoxia. EXAM: PORTABLE CHEST 1 VIEW COMPARISON:  Chest radiograph dated 10/28/2019 and CT dated 10/28/2019. FINDINGS: Endotracheal tube above the carina and enteric tube extends below the diaphragm with tip the on the inferior margin of the image and side-port in the region of the stomach. Bilateral mid to lower lung field airspace opacities similar to prior radiograph. No large pleural effusion. No pneumothorax. Mild cardiomegaly. No acute osseous pathology.  IMPRESSION: 1. No significant interval change in bilateral mid to lower lung field airspace opacities. 2. Stable support apparatus. Electronically Signed   By: Anner Crete M.D.   On: 10/29/2019 03:46   DG Chest Portable 1 View  Result Date: 10/28/2019 CLINICAL DATA:  Status post intubation today. EXAM: PORTABLE CHEST 1 VIEW COMPARISON:  Single-view of the chest 10/27/2019. FINDINGS: New NG tube is in place in good position with the side-port in the stomach. Endotracheal tube is also in good position with the tip just below the clavicular heads. Right worse than left airspace disease has progressed since yesterday's exam. No pneumothorax or pleural fluid. Heart size normal. IMPRESSION: ETT and NG tube in good position. Worsened airspace disease consistent with progressive pneumonia. Electronically Signed   By: Inge Rise M.D.   On: 10/28/2019 11:42   DG Chest Port 1 View  Result Date: 10/27/2019 CLINICAL DATA:  Shortness of breath. EXAM: PORTABLE CHEST 1 VIEW COMPARISON:  January 09, 2015. FINDINGS: The heart size and mediastinal contours are within normal limits. No pneumothorax or pleural effusion is noted. Multiple ill-defined airspace opacities are noted bilaterally concerning for multifocal pneumonia. The visualized skeletal structures are unremarkable. IMPRESSION: Bilateral multifocal pneumonia. Electronically Signed   By: Marijo Conception M.D.   On: 10/27/2019 14:27   ECHOCARDIOGRAM COMPLETE  Result Date: 10/28/2019    ECHOCARDIOGRAM REPORT   Patient Name:   LIAHNA BRICKNER Date of Exam: 10/28/2019 Medical Rec #:  [id number]       Height:       64.0 in Accession #:    [id number]      Weight:       120.0 lb Date of Birth:  September 15, 1985        BSA:          1.575 m Patient Age:    34 years        BP:           130/94 mmHg Patient Gender: F               HR:  116 bpm. Exam Location:  [facility] Procedure: 2D Echo, Cardiac Doppler and Color Doppler Indications:    Pulmonary Embolus 415.19  / I26.99  History:        Patient has no prior history of Echocardiogram examinations.                 Multifocal pneumonia, Acute respiratory failure with hypoxia.  Sonographer:    Alvino Chapel RCS Referring Phys: [phone] Nakyiah Kuck IMPRESSIONS  1. Left ventricular ejection fraction, by estimation, is 55 to 60%. The left ventricle has normal function. The left ventricle has no regional wall motion abnormalities. Left ventricular diastolic parameters are indeterminate.  2. Right ventricular systolic function is moderately reduced. The right ventricular size is severely enlarged. There is moderately elevated pulmonary artery systolic pressure.  3. The mitral valve is normal in structure. Mild mitral valve regurgitation. No evidence of mitral stenosis.  4. The aortic valve has an indeterminant number of cusps. Aortic valve regurgitation is not visualized. No aortic stenosis is present. FINDINGS  Left Ventricle: Left ventricular ejection fraction, by estimation, is 55 to 60%. The left ventricle has normal function. The left ventricle has no regional wall motion abnormalities. The left ventricular internal cavity size was normal in size. There is  no left ventricular hypertrophy. Left ventricular diastolic parameters are indeterminate. Right Ventricle: There is septal flattening in systole. The right ventricular size is severely enlarged. No increase in right ventricular wall thickness. Right ventricular systolic function is moderately reduced. There is moderately elevated pulmonary artery systolic pressure. The tricuspid regurgitant velocity is 3.23 m/s, and with an assumed right atrial pressure of 10 mmHg, the estimated right ventricular systolic pressure is 25.0 mmHg. Left Atrium: Left atrial size was normal in size. Right Atrium: Right atrial size was normal in size. Pericardium: There is no evidence of pericardial effusion. Mitral Valve: The mitral valve is normal in structure. Mild mitral valve regurgitation.  No evidence of mitral valve stenosis. Tricuspid Valve: The tricuspid valve is normal in structure. Tricuspid valve regurgitation is mild . No evidence of tricuspid stenosis. Aortic Valve: The aortic valve has an indeterminant number of cusps. Aortic valve regurgitation is not visualized. No aortic stenosis is present. Aortic valve mean gradient measures 2.7 mmHg. Aortic valve peak gradient measures 4.9 mmHg. Aortic valve area, by VTI measures 1.50 cm. Pulmonic Valve: The pulmonic valve was not well visualized. Pulmonic valve regurgitation is not visualized. No evidence of pulmonic stenosis. Aorta: The aortic root is normal in size and structure. Venous: IVC assessment for right atrial pressure unable to be performed due to mechanical ventilation. IAS/Shunts: No atrial level shunt detected by color flow Doppler.  LEFT VENTRICLE PLAX 2D LVIDd:         3.94 cm  Diastology LVIDs:         2.98 cm  LV e' lateral:   19.90 cm/s LV PW:         0.80 cm  LV E/e' lateral: 4.6 LV IVS:        0.83 cm  LV e' medial:    14.40 cm/s LVOT diam:     1.70 cm  LV E/e' medial:  6.3 LV SV:         27 LV SV Index:   17 LVOT Area:     2.27 cm  RIGHT VENTRICLE RV S prime:     15.70 cm/s TAPSE (M-mode): 2.2 cm LEFT ATRIUM           Index  RIGHT ATRIUM           Index LA diam:      2.20 cm 1.40 cm/m  RA Area:     17.40 cm LA Vol (A4C): 34.9 ml 22.16 ml/m RA Volume:   48.50 ml  30.80 ml/m  AORTIC VALVE AV Area (Vmax):    1.78 cm AV Area (Vmean):   1.50 cm AV Area (VTI):     1.50 cm AV Vmax:           110.51 cm/s AV Vmean:          78.791 cm/s AV VTI:            0.178 m AV Peak Grad:      4.9 mmHg AV Mean Grad:      2.7 mmHg LVOT Vmax:         86.50 cm/s LVOT Vmean:        52.200 cm/s LVOT VTI:          0.117 m LVOT/AV VTI ratio: 0.66  AORTA Ao Root diam: 3.10 cm MITRAL VALVE               TRICUSPID VALVE MV Area (PHT): 6.96 cm    TR Peak grad:   41.7 mmHg MV Decel Time: 109 msec    TR Vmax:        323.00 cm/s MV E velocity: 90.90  cm/s                            SHUNTS                            Systemic VTI:  0.12 m                            Systemic Diam: 1.70 cm Carlyle Dolly MD Electronically signed by Carlyle Dolly MD Signature Date/Time: 10/28/2019/3:31:55 PM    Final    [place] EKG SITE RITE  Result Date: 11/01/2019 If Site Rite image not attached, placement could not be confirmed due to current cardiac rhythm.    CBC Recent Labs  Lab 10/30/19 1331 10/31/19 0419 11/01/19 0623 11/02/19 0713 11/03/19 0219  WBC 10.1 7.7 9.4 10.6* 11.1*  HGB 7.5* 7.8* 7.8* 8.1* 7.9*  HCT 24.1* 25.3* 24.9* 24.7* 25.7*  PLT 45* 62* 119* 223 258  MCV 87.0 87.8 88.6 88.8 88.6  MCH 27.1 27.1 27.8 29.1 27.2  MCHC 31.1 30.8 31.3 32.8 30.7  RDW 20.3* 19.9* 20.3* 21.2* 20.6*   Chemistries  Recent Labs  Lab 10/28/19 0331 10/28/19 0331 10/28/19 0941 10/28/19 0941 10/29/19 0408 10/29/19 0408 10/30/19 0354 10/30/19 0354 10/30/19 1139 10/30/19 1331 10/31/19 0419 11/01/19 0623 11/02/19 0713 11/03/19 0219  NA 138   < > 137   < > 138   < > 140   < >  --  141 141 143 140 141  K 4.0   < > 4.1   < > 3.9   < > 3.4*   < >  --  3.1* 3.8 2.8* 2.7* 3.1*  CL 112*   < > 112*   < > 113*   < > 111   < >  --  112* 109 107 104 103  CO2 15*   < > 15*   < > 17*   < > 23   < >  --  23 24 25 27 28   GLUCOSE 125*   < > 121*   < > 150*   < > 124*   < >  --  101* 110* 106* 96 104*  BUN 18   < > 18   < > 24*   < > 11   < >  --  9 10 13 13 14   CREATININE 0.66   < > 0.65   < > 0.71   < > 0.50   < >  --  0.53 0.42* 0.37* 0.35* 0.35*  CALCIUM 7.7*   < > 7.8*   < > 7.7*   < > 7.7*   < >  --  7.5* 7.6* 7.6* 7.2* 7.7*  MG 2.6*  --   --   --   --   --   --   --  1.9  --   --   --  1.6*  --   AST 26  --  22  --  123*  --  32  --   --  34  --   --   --   --   ALT 16  --  15  --  59*  --  43  --   --  43  --   --   --   --   ALKPHOS 162*  --  150*  --  130*  --  97  --   --  92  --   --   --   --   BILITOT 0.8  --  0.8  --  0.6  --  0.4  --   --  0.6  --    --   --   --    < > = values in this interval not displayed.   ------------------------------------------------------------------------------------------------------------------ Recent Labs    11/02/19 0713 11/03/19 0219  TRIG 926* 222*    No results found for: HGBA1C ------------------------------------------------------------------------------------------------------------------ No results for input(s): TSH, T4TOTAL, T3FREE, THYROIDAB in the last 72 hours.  Invalid input(s): FREET3 ------------------------------------------------------------------------------------------------------------------ No results for input(s): VITAMINB12, FOLATE, FERRITIN, TIBC, IRON, RETICCTPCT in the last 72 hours.  Coagulation profile Recent Labs  Lab 10/30/19 0354 10/30/19 1331 10/31/19 0419 11/01/19 0623 11/02/19 0713  INR 1.4* 1.4* 1.3* 1.2 1.1    No results for input(s): DDIMER in the last 72 hours.  Cardiac Enzymes No results for input(s): CKMB, TROPONINI, MYOGLOBIN in the last 168 hours.  Invalid input(s): CK ------------------------------------------------------------------------------------------------------------------    Component Value Date/Time   BNP 271.0 (H) 10/27/2019 1438   Roxan Hockey M.D on 11/03/2019 at 4:24 PM  Go to [email or web address] - for contact info  [facility] - Office  [phone]

## 2019-11-03 NOTE — Progress Notes (Signed)
ANTICOAGULATION CONSULT NOTE   Pharmacy Consult for Heparin  Indication: pulmonary embolus  Allergies  Allergen Reactions  . Bactrim [Sulfamethoxazole-Trimethoprim] Nausea And Vomiting  . Ciprofloxacin Nausea And Vomiting  . Darvocet [Propoxyphene N-Acetaminophen] Hives  . Other     darvocet  . Penicillins Itching  . Penicillins Hives  . Bactrim [Sulfamethoxazole-Trimethoprim] Rash  . Ciprofloxacin Rash    Patient Measurements: Height: 5\' 8"  (172.7 cm) Weight: 68 kg (149 lb 14.6 oz) IBW/kg (Calculated) : 63.9 HEPARIN DW (KG): 54.4  Vital Signs: Temp: 98.3 F (36.8 C) (08/31 0425) Temp Source: Axillary (08/31 0425) BP: 111/51 (08/31 0000) Pulse Rate: 66 (08/31 0000)  Labs: Recent Labs    11/01/19 0623 11/01/19 1254 11/02/19 0713 11/02/19 1930 11/03/19 0219  HGB 7.8*  --  8.1*  --  7.9*  HCT 24.9*  --  24.7*  --  25.7*  PLT 119*  --  223  --  258  APTT  --   --  65*  --   --   LABPROT 15.1  --  13.8  --   --   INR 1.2  --  1.1  --   --   HEPARINUNFRC  --    < > 0.26* 0.46 0.45  CREATININE 0.37*  --  0.35*  --  0.35*   < > = values in this interval not displayed.    Estimated Creatinine Clearance: 100 mL/min (A) (by C-G formula based on SCr of 0.35 mg/dL (L)).  Assessment: Diane Norton a 34 y.o. female requires anticoagulation with a heparin iv infusion for the indication of  pulmonary embolus. Heparin gtt will be started following pharmacy protocol per pharmacy consult.   Heparin level remains therapeutic at 0.45 this AM. No bleeding noted.  Goal of Therapy:  Heparin level 0.3-0.7 units/ml Monitor platelets by anticoagulation protocol: Yes   Plan:  Continue heparin infusion at 1650 units/hr Daily heparin level, CBC Continue to monitor H&H and s/s of bleeding  Thank you for involving pharmacy in this patient's care.  20, BS Elder Cyphers, Loura Back Clinical Pharmacist Pager [phone] 11/03/2019 9:46 AM

## 2019-11-03 NOTE — Progress Notes (Signed)
This RN has attempted to reduce sedation periodically throughout the shift. The patient does not tolerate it at all. Any small reduction in sedation, the patient is gagging, coughing and her respiratory rate increases.

## 2019-11-03 NOTE — Progress Notes (Signed)
Pharmacy Antibiotic Note  Diane Norton is a 34 y.o. female admitted on 10/27/2019 with pneumonia.  Pharmacy has been consulted for meropenem dosing.  Patient has multiple allergies to different antibiotic classes. Patient improving, attempt to extubate today.Acute hypoxic respiratory failure----secondary to bilateral pneumonia.  Plan: Continue meropenem 1g IV q8h Pharmacy to monitor renal function, cultures and patient progress.  Height: 5\' 8"  (172.7 cm) Weight: 68 kg (149 lb 14.6 oz) IBW/kg (Calculated) : 63.9  Temp (24hrs), Avg:98.8 F (37.1 C), Min:98.3 F (36.8 C), Max:100 F (37.8 C)  Recent Labs  Lab 10/27/19 1438 10/28/19 0331 10/30/19 1331 10/31/19 0419 11/01/19 0623 11/02/19 0713 11/03/19 0219  WBC 13.8*   < > 10.1 7.7 9.4 10.6* 11.1*  CREATININE 0.89   < > 0.53 0.42* 0.37* 0.35* 0.35*  LATICACIDVEN 1.3  --   --   --   --   --   --    < > = values in this interval not displayed.    Estimated Creatinine Clearance: 100 mL/min (A) (by C-G formula based on SCr of 0.35 mg/dL (L)).    Allergies  Allergen Reactions  . Bactrim [Sulfamethoxazole-Trimethoprim] Nausea And Vomiting  . Ciprofloxacin Nausea And Vomiting  . Darvocet [Propoxyphene N-Acetaminophen] Hives  . Other     darvocet  . Penicillins Itching  . Penicillins Hives  . Bactrim [Sulfamethoxazole-Trimethoprim] Rash  . Ciprofloxacin Rash    Antimicrobials this admission: doxycycline 8/26 >>  8/30 meropenem 8/26>> Azith 8/24 >>8/26 CTX 8/24 >>8/26   Microbiology results:  8/24 BC x2:  NG X2 days 8/24 MRSA PCR: neg 8/25 Resp Cx: few GPC on GS 8/25 Strep Pneum UA: neg 8/25: legionella: neg  Thank you for allowing pharmacy to be a part of this patient's care.  9/25, BS Pharm D, Elder Cyphers Clinical Pharmacist Pager [phone] 11/03/2019 10:15 AM

## 2019-11-04 ENCOUNTER — Inpatient Hospital Stay (HOSPITAL_COMMUNITY): Payer: Medicaid Other

## 2019-11-04 LAB — GLUCOSE, CAPILLARY
Glucose-Capillary: 114 mg/dL — ABNORMAL HIGH (ref 70–99)
Glucose-Capillary: 128 mg/dL — ABNORMAL HIGH (ref 70–99)

## 2019-11-04 LAB — CBC
HCT: 27.2 % — ABNORMAL LOW (ref 36.0–46.0)
Hemoglobin: 8.2 g/dL — ABNORMAL LOW (ref 12.0–15.0)
MCH: 26.8 pg (ref 26.0–34.0)
MCHC: 30.1 g/dL (ref 30.0–36.0)
MCV: 88.9 fL (ref 80.0–100.0)
Platelets: 338 10*3/uL (ref 150–400)
RBC: 3.06 MIL/uL — ABNORMAL LOW (ref 3.87–5.11)
RDW: 19.9 % — ABNORMAL HIGH (ref 11.5–15.5)
WBC: 10 10*3/uL (ref 4.0–10.5)
nRBC: 0 % (ref 0.0–0.2)

## 2019-11-04 LAB — BASIC METABOLIC PANEL
Anion gap: 12 (ref 5–15)
BUN: 14 mg/dL (ref 6–20)
CO2: 26 mmol/L (ref 22–32)
Calcium: 8.4 mg/dL — ABNORMAL LOW (ref 8.9–10.3)
Chloride: 103 mmol/L (ref 98–111)
Creatinine, Ser: 0.46 mg/dL (ref 0.44–1.00)
GFR calc Af Amer: >60 mL/min (ref >60)
GFR calc non Af Amer: >60 mL/min (ref >60)
Glucose, Bld: 117 mg/dL — ABNORMAL HIGH (ref 70–99)
Potassium: 3.6 mmol/L (ref 3.5–5.1)
Sodium: 141 mmol/L (ref 135–145)

## 2019-11-04 LAB — HEPARIN LEVEL (UNFRACTIONATED): Heparin Unfractionated: 0.29 IU/mL — ABNORMAL LOW (ref 0.30–0.70)

## 2019-11-04 MED ORDER — HYDROMORPHONE HCL 1 MG/ML IJ SOLN: 1.0000 mg | Freq: Four times a day (QID) | INTRAMUSCULAR | Status: DC | PRN

## 2019-11-04 MED ORDER — APIXABAN 5 MG PO TABS: 5.0000 mg | ORAL_TABLET | Freq: Two times a day (BID) | ORAL | Status: DC

## 2019-11-04 MED ORDER — OXYCODONE HCL 5 MG PO TABS
5.0000 mg | ORAL_TABLET | Freq: Four times a day (QID) | ORAL | Status: DC | PRN
  Administered 2019-11-04 – 2019-11-05 (×4): 5 mg via ORAL
  Filled 2019-11-04 (×4): qty 1

## 2019-11-04 MED ORDER — POTASSIUM CHLORIDE CRYS ER 20 MEQ PO TBCR
40.0000 meq | EXTENDED_RELEASE_TABLET | Freq: Once | ORAL | Status: AC
  Administered 2019-11-04: 40 meq via ORAL
  Filled 2019-11-04: qty 2

## 2019-11-04 MED ORDER — APIXABAN 5 MG PO TABS
10.0000 mg | ORAL_TABLET | Freq: Two times a day (BID) | ORAL | Status: DC
  Administered 2019-11-04 – 2019-11-05 (×3): 10 mg via ORAL
  Filled 2019-11-04 (×3): qty 2

## 2019-11-04 MED ORDER — GUAIFENESIN ER 600 MG PO TB12
600.0000 mg | ORAL_TABLET | Freq: Two times a day (BID) | ORAL | Status: DC
  Administered 2019-11-04 (×2): 600 mg via ORAL
  Filled 2019-11-04 (×3): qty 1

## 2019-11-04 NOTE — Progress Notes (Signed)
Patient Demographics:    Diane Norton, is a 34 y.o. female, DOB - [birth year], ZOX:[id number]  Admit date - 10/27/2019   Admitting Physician Aalyssa Elderkin Denton Brick, MD  Outpatient Primary MD for the patient is Patient, No Pcp Per  LOS - 8   Chief Complaint  Patient presents with  . Shortness of Breath        Subjective:    Diane Norton     -Extubated 11/03/2019  --Cooperative, eating and drinking okay No fever  Or chills  -Dyspnea and exertion and hypoxia persist   Assessment  & Plan :    Principal Problem:   Acute Hypoxic respiratory failure (HCC) Active Problems:   DIC (disseminated intravascular coagulation) ([place])   Acute pulmonary embolism (HCC)   Asthma   Multifocal pneumonia   History of seizures   PNA (pneumonia)   Thrombocytopenia (HCC)  Brief summary 34 year old Caucasian female with past medical history relevant for ongoing heroin abuse, 2 pack-a-day smoker with COPD/emphysema, history of underlying asthma, history of seizures with no recent seizure episodes and 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 admitted on 10/27/2019 with hypoxic respiratory failure secondary to combination of to bilateral nonocclusive PE, bilateral multifocal pneumonia and COPD exacerbation--- hypoxic respiratory failure worsened, patient was confused and not compliant with oxygen therapy, intubated 10/28/2019 -Currently on IV propofol and IV dilaudid drips --- ADAMTs 13 panel is 59.9 %--- which would be inconsistent with TTP, this is more consistent with DIC   A/p 1)Acute hypoxic respiratory failure----secondary to bilateral pneumonia, COPD/emphysema flareup and bilateral nonocclusive PE---- -intubated 10/28/2019 -Extubated 11/03/2019 -completed COVID-19 vaccination [employer]) in  April 2021 -COVID-19 test negative x 3 times  this admission -Stop IV Solu-Medrol okay to use p.o.  prednisone, last dose 11/05/2019 - chest x-ray 11/04/2019--with improved findings ---Clinically and radiologically improving  2)Bilateral multifocal pneumonia---- intubated for hypoxic respiratory failure as above #1 -Extubated 11/03/2019 --Bronchodilators, -Strep pneumo negative, -Legionella neg PCT 2.08 >>0.81>>4.68---  ---stopped azithromycin and Rocephin on 10/29/2019  --started on meropenem and doxycycline on 10/29/19-- --last dose of antibiotic 11/04/19 --Clinically and radiologically improving  3) acute COPD/emphysema exacerbation--- secondary to #2 above, contributing to #1 above --Patient is a 2 pack-a-day smoker -Steroids and antibiotics as above #1 #2  4) heroin addiction--- patient uses heroin on admission at least $50 worth of heroin on a daily basis, she does not inject apparently she snorts heroin -As needed lorazepam and as needed Dilaudid -Precedex drip and propofol drip discontinued  5)Bilateral nonocclusive pulmonary embolism--- patient received therapeutic dose of Lovenox on 10/27/2019 --Started IV heparin drip on 10/28/2019, discontinued IV heparin on 10/29/2019 due to worsening thrombocytopenia -- given improving fibrinogen, platelet count and improving DIC  profile  -- restarted IV heparin for PE without bolus on 10/31/2019 -Echo with EF of 55 to 60%, with severe right ventricular enlargement and moderate pulmonary hypertension -Lower extremity venous Dopplers negative -Off IV heparin and switch to p.o. Eliquis on 11/04/2019  6)Social/Ethics---Spoke with Brother --- Bronson Ing at [phone] -- Brother unable to visit as he was dxed with covid PNA on 10/10/19 and he was just dced from hospital last week  Pt's brother stated that Rue was not around him recently (not exposed to him while he had covid) --  Spoke with Mother---[name] [phone] Updated Both brother and mother----  7) history of seizures--no recent seizures, currently intubated and sedated with propofol  along with as needed lorazepam  8) acute on chronic anemia and acute thrombocytopenia/DIC--- on admission on 10/27/2019 platelet count was 217, patient received Lovenox 80 mg subcu x1 on 10/27/2019 -Patient was started on IV heparin for PE on 10/28/2019 in a.m. -patient received therapeutic dose of Lovenox on 10/27/2019 --Started IV heparin drip on 10/28/2019 in am , discontinued IV heparin on 10/29/2019 due to worsening thrombocytopenia -- given improving fibrinogen, platelet count and improving DIC  profile  -- restarted IV heparin for PE without bolus on 10/31/2019 Platelets 217>>142>>116>>39>>37>>38>>45>>61>>119>>223 Hgb 10.8 >>> 8.8 >>7.8>>7.5>.7.8 LDH 664>>594>>532>> 523>>421>>380 Fibrinogen  594>>79>>127>>>175>>213>>349 inr 1.5>>>1.4>>1.3>>1.2>>1.1 -- hematology consult from Dr. Delton Coombes appreciated -No obvious bleeding at this time ----HIT and DIC panel results reviewed with Dr. Delton Coombes --DIC is more likely than HIT given the clinical presentation and lab findings -He advised stopping IV heparin, he advised against argatroban -- we gave FFP and cryoprecipitate 10/29/19 to reduce bleeding risk -Vitamin K given on 10/30/2019 --Since DIC may be triggered by antibiotics/medications--we will stop azithromycin and Rocephin which was started on admission and instead use meropenem and doxycycline -Patient has multiple allergies limiting choices of antibiotics -- ADAMTs 13 panel is 59.9 --- which would be inconsistent with TTP, this is more consistent with DIC --- I called [facility] hematology transfer line----[facility] declined to accept patient for transfer due to lack of beds -Overall DIC pathophysiology has resolved at this time  9)Prophylaxis--Protonix for GI prophylaxis  10)FEN/hypokalemia/hypomagnesemia and hypophosphatemia--replace potassium, magnesium and phosphorous --- --GI soft diet  11) generalized weakness and deconditioning--- physical therapist recommend SNF rehab, case  management/discharge planner notified --Physical therapy to reevaluate patient on 11/05/2019   Disposition/Need for in-Hospital Stay- patient unable to be discharged at this time due to --- -  generalized weakness and deconditioning requiring SNF placement for rehab, acute hypoxic respiratory failure requiring supplemental oxygen -Possible discharge on 11/05/2019 May need O2 at discharge for COPD/pneumonia  Status is: Inpatient  Remains inpatient appropriate because:eneralized weakness and deconditioning requiring SNF placement for rehab, acute hypoxic respiratory failure requiring supplemental oxygen   Disposition: The patient is from: Home              Anticipated d/c is to: Home with HH Vs SNF              Anticipated d/c date is: 1 day              Patient currently is not medically stable to d/c. Barriers: Not Clinically Stable-  -eneralized weakness and deconditioning requiring SNF placement for rehab, acute hypoxic respiratory failure requiring supplemental oxygen -Possible discharge on 11/05/2019 May need O2 at discharge for COPD/pneumonia   Code Status : Full  Family Communication:   -  D/w patient's mother [name]  -and pt Brother Juniper Cobey at [phone]  Consults  :  PCCM/hematology  DVT Prophylaxis  : Eliquis  Lab Results  Component Value Date   PLT 338 11/04/2019    Inpatient Medications  Scheduled Meds: . apixaban  10 mg Oral BID   Followed by  . [START ON 11/11/2019] apixaban  5 mg Oral BID  . chlorhexidine gluconate (MEDLINE KIT)  15 mL Mouth Rinse BID  . Chlorhexidine Gluconate Cloth  6 each Topical Daily  . folic acid  1 mg Oral Daily  . guaiFENesin  600 mg Oral  BID  . ipratropium-albuterol  3 mL Nebulization TID  . multivitamin with minerals  1 tablet Oral Daily  . pantoprazole  40 mg Oral Daily  . potassium chloride  40 mEq Oral Once  . predniSONE  20 mg Oral Q breakfast  . sodium chloride flush  3 mL Intravenous Q12H  . thiamine  100 mg Oral  Daily   Or  . thiamine  100 mg Intravenous Daily  . traZODone  150 mg Oral QHS   Continuous Infusions: . sodium chloride    . meropenem (MERREM) IV 1 g (11/04/19 0812)   PRN Meds:.sodium chloride, acetaminophen **OR** acetaminophen, albuterol, HYDROmorphone (DILAUDID) injection, LORazepam, ondansetron **OR** ondansetron (ZOFRAN) IV, oxyCODONE, sodium chloride flush    Anti-infectives (From admission, onward)   Start     Dose/Rate Route Frequency Ordered Stop   10/29/19 1700  meropenem (MERREM) 1 g in sodium chloride 0.9 % 100 mL IVPB        1 g 200 mL/hr over 30 Minutes Intravenous Every 8 hours 10/29/19 1557 11/04/19 2359   10/29/19 1600  doxycycline (VIBRAMYCIN) 100 mg in sodium chloride 0.9 % 250 mL IVPB  Status:  Discontinued        100 mg 125 mL/hr over 120 Minutes Intravenous Every 12 hours 10/29/19 1536 11/02/19 1322   10/28/19 1800  vancomycin (VANCOCIN) IVPB 1000 mg/200 mL premix  Status:  Discontinued        1,000 mg 200 mL/hr over 60 Minutes Intravenous Every 24 hours 10/27/19 1645 10/27/19 1956   10/28/19 1600  azithromycin (ZITHROMAX) 500 mg in sodium chloride 0.9 % 250 mL IVPB  Status:  Discontinued        500 mg 250 mL/hr over 60 Minutes Intravenous Every 24 hours 10/27/19 1956 10/29/19 1536   10/28/19 1600  cefTRIAXone (ROCEPHIN) 1 g in sodium chloride 0.9 % 100 mL IVPB  Status:  Discontinued        1 g 200 mL/hr over 30 Minutes Intravenous Every 24 hours 10/27/19 1956 10/29/19 1536   10/27/19 1645  cefTRIAXone (ROCEPHIN) 1 g in sodium chloride 0.9 % 100 mL IVPB        1 g 200 mL/hr over 30 Minutes Intravenous  Once 10/27/19 1630 10/27/19 1826   10/27/19 1645  azithromycin (ZITHROMAX) 500 mg in sodium chloride 0.9 % 250 mL IVPB        500 mg 250 mL/hr over 60 Minutes Intravenous  Once 10/27/19 1630 10/27/19 1957   10/27/19 1645  vancomycin (VANCOREADY) IVPB 1250 mg/250 mL        1,250 mg 166.7 mL/hr over 90 Minutes Intravenous  Once 10/27/19 1638 10/27/19 2015         Objective:   Vitals:   11/04/19 0600 11/04/19 0700 11/04/19 0900 11/04/19 0940  BP: 119/63 119/60 120/69   Pulse: 66 66 (!) 57   Resp: (!) 31 (!) 27 (!) 24   Temp:      TempSrc:      SpO2: 94% 93% 93% 93%  Weight:      Height:        Wt Readings from Last 3 Encounters:  11/04/19 62.7 kg  01/11/19 59 kg  01/09/15 65.8 kg     Intake/Output Summary (Last 24 hours) at 11/04/2019 1201 Last data filed at 11/04/2019 1100 Gross per 24 hour  Intake 1559.41 ml  Output 7150 ml  Net -5590.59 ml    Physical Exam  Gen:-extubated, in no acute distress HEENT:-Sclera nonicteric Nose- [place]  2L/min Neck-supple, no JVD,.  Lungs--improved air movement, no wheezing CV- S1, S2 normal, regular  Abd-  +ve B.Sounds, Abd Soft, ND Extremity/Skin:- No  edema, pedal pulses present NeuroPsych--alert and oriented x3 generalized weakness but no new focal deficits  GU --- Foley removed on 11/04/2019   Data Review:   Micro Results Recent Results (from the past 240 hour(s))  Blood Culture (routine x 2)     Status: None   Collection Time: 10/27/19  1:50 PM   Specimen: BLOOD LEFT HAND  Result Value Ref Range Status   Specimen Description BLOOD LEFT HAND  Final   Special Requests   Final    BOTTLES DRAWN AEROBIC AND ANAEROBIC Blood Culture results may not be optimal due to an inadequate volume of blood received in culture bottles   Culture   Final    NO GROWTH 5 DAYS Performed at [facility], [address] [place], [place] [address]    Report Status 11/01/2019 FINAL  Final  SARS Coronavirus 2 by RT PCR (hospital order, performed in [place] hospital lab) Nasopharyngeal Nasopharyngeal Swab     Status: None   Collection Time: 10/27/19  2:14 PM   Specimen: Nasopharyngeal Swab  Result Value Ref Range Status   SARS Coronavirus 2 NEGATIVE NEGATIVE Final    Comment: (NOTE) SARS-CoV-2 target nucleic acids are NOT DETECTED.  The SARS-CoV-2 RNA is generally detectable in upper and  lower respiratory specimens during the acute phase of infection. The lowest concentration of SARS-CoV-2 viral copies this assay can detect is 250 copies / mL. A negative result does not preclude SARS-CoV-2 infection and should not be used as the sole basis for treatment or other patient management decisions.  A negative result may occur with improper specimen collection / handling, submission of specimen other than nasopharyngeal swab, presence of viral mutation(s) within the areas targeted by this assay, and inadequate number of viral copies (<250 copies / mL). A negative result must be combined with clinical observations, patient history, and epidemiological information.  Fact Sheet for Patients:   StrictlyIdeas.no  Fact Sheet for Healthcare Providers: BankingDealers.co.za  This test is not yet approved or  cleared by the [place] FDA and has been authorized for detection and/or diagnosis of SARS-CoV-2 by FDA under an Emergency Use Authorization (EUA).  This EUA will remain in effect (meaning this test can be used) for the duration of the COVID-19 declaration under Section 564(b)(1) of the Act, 21 U.S.C. section 360bbb-3(b)(1), unless the authorization is terminated or revoked sooner.  Performed at [facility], [address], [place], [place] [address]   Blood Culture (routine x 2)     Status: None   Collection Time: 10/27/19  2:39 PM   Specimen: BLOOD RIGHT HAND  Result Value Ref Range Status   Specimen Description BLOOD RIGHT HAND  Final   Special Requests   Final    BOTTLES DRAWN AEROBIC AND ANAEROBIC Blood Culture adequate volume   Culture   Final    NO GROWTH 5 DAYS Performed at [facility], [address] [place], [place] [address]    Report Status 11/01/2019 FINAL  Final  MRSA PCR Screening     Status: None   Collection Time: 10/27/19  9:30 PM   Specimen: Nasopharyngeal  Result Value Ref Range Status   MRSA by  PCR NEGATIVE NEGATIVE Final    Comment:        The GeneXpert MRSA Assay (FDA approved for NASAL specimens only), is one component  of a comprehensive MRSA colonization surveillance program. It is not intended to diagnose MRSA infection nor to guide or monitor treatment for MRSA infections. Performed at [facility], [address] [place], [place] [address]   SARS Coronavirus 2 by RT PCR (hospital order, performed in [facility] hospital lab) Nasopharyngeal     Status: None   Collection Time: 10/27/19  9:32 PM   Specimen: Nasopharyngeal  Result Value Ref Range Status   SARS Coronavirus 2 NEGATIVE NEGATIVE Final    Comment: (NOTE) SARS-CoV-2 target nucleic acids are NOT DETECTED.  The SARS-CoV-2 RNA is generally detectable in upper and lower respiratory specimens during the acute phase of infection. The lowest concentration of SARS-CoV-2 viral copies this assay can detect is 250 copies / mL. A negative result does not preclude SARS-CoV-2 infection and should not be used as the sole basis for treatment or other patient management decisions.  A negative result may occur with improper specimen collection / handling, submission of specimen other than nasopharyngeal swab, presence of viral mutation(s) within the areas targeted by this assay, and inadequate number of viral copies (<250 copies / mL). A negative result must be combined with clinical observations, patient history, and epidemiological information.  Fact Sheet for Patients:   StrictlyIdeas.no  Fact Sheet for Healthcare Providers: BankingDealers.co.za  This test is not yet approved or  cleared by the [place] FDA and has been authorized for detection and/or diagnosis of SARS-CoV-2 by FDA under an Emergency Use Authorization (EUA).  This EUA will remain in effect (meaning this test can be used) for the duration of the COVID-19 declaration under Section 564(b)(1) of the  Act, 21 U.S.C. section 360bbb-3(b)(1), unless the authorization is terminated or revoked sooner.  Performed at [facility], [address], [place], [place] [address]   Culture, respiratory (non-expectorated)     Status: None   Collection Time: 10/28/19  1:56 PM   Specimen: Tracheal Aspirate; Respiratory  Result Value Ref Range Status   Specimen Description   Final    TRACHEAL ASPIRATE Performed at [facility], [address], [place], [place] [address]    Special Requests   Final    Normal Performed at [facility], [address] [place], [place] [address]    Gram Stain   Final    MODERATE WBC PRESENT, PREDOMINANTLY PMN RARE SQUAMOUS EPITHELIAL CELLS PRESENT ABUNDANT YEAST FEW GRAM POSITIVE COCCI    Culture   Final    RARE Normal respiratory flora-no Staph aureus or Pseudomonas seen Performed at [facility] [address], [place], [place] [address]    Report Status 10/31/2019 FINAL  Final  SARS Coronavirus 2 by RT PCR (hospital order, performed in [facility] hospital lab) Nasopharyngeal Nasopharyngeal Swab     Status: None   Collection Time: 10/29/19  9:29 AM   Specimen: Nasopharyngeal Swab  Result Value Ref Range Status   SARS Coronavirus 2 NEGATIVE NEGATIVE Final    Comment: (NOTE) SARS-CoV-2 target nucleic acids are NOT DETECTED.  The SARS-CoV-2 RNA is generally detectable in upper and lower respiratory specimens during the acute phase of infection. The lowest concentration of SARS-CoV-2 viral copies this assay can detect is 250 copies / mL. A negative result does not preclude SARS-CoV-2 infection and should not be used as the sole basis for treatment or other patient management decisions.  A negative result may occur with improper specimen collection / handling, submission of specimen other than nasopharyngeal swab, presence of viral mutation(s) within the  areas targeted by this assay, and inadequate number of viral copies (<250 copies / mL). A negative  result must be combined with clinical observations, patient history, and epidemiological information.  Fact Sheet for Patients:   StrictlyIdeas.no  Fact Sheet for Healthcare Providers: BankingDealers.co.za  This test is not yet approved or  cleared by the [place] FDA and has been authorized for detection and/or diagnosis of SARS-CoV-2 by FDA under an Emergency Use Authorization (EUA).  This EUA will remain in effect (meaning this test can be used) for the duration of the COVID-19 declaration under Section 564(b)(1) of the Act, 21 U.S.C. section 360bbb-3(b)(1), unless the authorization is terminated or revoked sooner.  Performed at [facility], [address], [place], [place] [address]    Radiology Reports DG Chest 2 View  Result Date: 11/04/2019 CLINICAL DATA:  Recent extubation. Patient states condition improving. Covid negative 8/26. Hx asthma and smoker. EXAM: CHEST - 2 VIEW COMPARISON:  Chest radiograph 11/02/2019 FINDINGS: Interval extubation and removal of a nasogastric tube. A right upper extremity PICC remains in place. Improved lung volumes. There are persistent coarse bilateral opacities. Mild heterogeneous opacities at the left base likely atelectasis or residual infection. No pneumothorax or large pleural effusion. No acute finding in the visualized skeleton. IMPRESSION: Improved lung volumes with persistent coarse bilateral opacities and probable left basilar atelectasis/residual infection. Electronically Signed   By: Audie Pinto M.D.   On: 11/04/2019 11:44   CT Angio Chest PE W and/or Wo Contrast  Result Date: 10/28/2019 CLINICAL DATA:  34 year old female with history of shortness of breath for the past 4 days. Fever. EXAM: CT ANGIOGRAPHY CHEST WITH CONTRAST TECHNIQUE: Multidetector CT imaging of the chest was performed using the standard protocol during bolus administration of intravenous contrast. Multiplanar CT  image reconstructions and MIPs were obtained to evaluate the vascular anatomy. CONTRAST:  36m OMNIPAQUE IOHEXOL 350 MG/ML SOLN COMPARISON:  Chest CT 01/09/2015. FINDINGS: Cardiovascular: Tiny nonobstructive filling defects are noted within subsegmental pulmonary artery branches to the right lower lobe (axial image 194 of series 5) and the left upper lobe (axial image 155 of series 5). No other larger central, lobar or segmental sized filling defects are noted. Heart size is normal. There is no significant pericardial fluid, thickening or pericardial calcification. No atherosclerotic calcifications in the thoracic aorta or coronary arteries. Mediastinum/Nodes: No pathologically enlarged mediastinal or hilar lymph nodes. Esophagus is unremarkable in appearance. No axillary lymphadenopathy. Lungs/Pleura: Widespread areas of severe ground-glass attenuation are noted scattered throughout the lungs bilaterally. No pleural effusions. Mild diffuse bronchial wall thickening with moderate centrilobular and paraseptal emphysema. Upper Abdomen: Unremarkable. Musculoskeletal: There are no aggressive appearing lytic or blastic lesions noted in the visualized portions of the skeleton. Review of the MIP images confirms the above findings. IMPRESSION: 1. Small nonocclusive pulmonary emboli in the lungs bilaterally, as above. 2. The appearance of the lungs is highly concerning for severe multilobar bilateral pneumonia, likely from atypical viral etiologies such as COVID infection. 3. Diffuse bronchial wall thickening with moderate centrilobular and paraseptal emphysema; imaging findings suggestive of underlying COPD. These results will be called to the ordering clinician or representative by the Radiologist Assistant, and communication documented in the PACS or [employer] Emphysema (ICD10-J43.9). Electronically Signed   By: DVinnie LangtonM.D.   On: 10/28/2019 09:37   [place]Abdomen Limited  Result Date: 10/29/2019 CLINICAL  DATA:  Elevated LFTs EXAM: ULTRASOUND ABDOMEN LIMITED RIGHT UPPER QUADRANT COMPARISON:  January 11, 2019. FINDINGS: Gallbladder: Status post cholecystectomy.  Common bile duct: Diameter: 2 mm, normal Liver: No focal lesion identified. Mildly heterogeneous liver echotexture. Portal vein is patent on color Doppler imaging with normal direction of blood flow towards the liver. Other: None. IMPRESSION: Mildly heterogeneous liver echotexture without focal lesion identified. Electronically Signed   By: Valentino Saxon MD   On: 10/29/2019 09:36   US Venous Img Lower Bilateral (DVT)  Result Date: 10/28/2019 CLINICAL DATA:  Acute respiratory failure. Small volume bilateral pulmonary embolism. Evaluate for DVT. EXAM: BILATERAL LOWER EXTREMITY VENOUS DOPPLER ULTRASOUND TECHNIQUE: Gray-scale sonography with graded compression, as well as color Doppler and duplex ultrasound were performed to evaluate the lower extremity deep venous systems from the level of the common femoral vein and including the common femoral, femoral, profunda femoral, popliteal and calf veins including the posterior tibial, peroneal and gastrocnemius veins when visible. The superficial great saphenous vein was also interrogated. Spectral Doppler was utilized to evaluate flow at rest and with distal augmentation maneuvers in the common femoral, femoral and popliteal veins. COMPARISON:  None. FINDINGS: RIGHT LOWER EXTREMITY Common Femoral Vein: No evidence of thrombus. Normal compressibility, respiratory phasicity and response to augmentation. Saphenofemoral Junction: No evidence of thrombus. Normal compressibility and flow on color Doppler imaging. Profunda Femoral Vein: No evidence of thrombus. Normal compressibility and flow on color Doppler imaging. Femoral Vein: No evidence of thrombus. Normal compressibility, respiratory phasicity and response to augmentation. Popliteal Vein: No evidence of thrombus. Normal compressibility, respiratory phasicity  and response to augmentation. Calf Veins: No evidence of thrombus. Normal compressibility and flow on color Doppler imaging. Superficial Great Saphenous Vein: No evidence of thrombus. Normal compressibility. Venous Reflux:  None. Other Findings:  None. LEFT LOWER EXTREMITY Common Femoral Vein: No evidence of thrombus. Normal compressibility, respiratory phasicity and response to augmentation. Saphenofemoral Junction: No evidence of thrombus. Normal compressibility and flow on color Doppler imaging. Profunda Femoral Vein: No evidence of thrombus. Normal compressibility and flow on color Doppler imaging. Femoral Vein: No evidence of thrombus. Normal compressibility, respiratory phasicity and response to augmentation. Popliteal Vein: No evidence of thrombus. Normal compressibility, respiratory phasicity and response to augmentation. Calf Veins: No evidence of thrombus. Normal compressibility and flow on color Doppler imaging. Superficial Great Saphenous Vein: No evidence of thrombus. Normal compressibility. Venous Reflux:  None. Other Findings:  None. IMPRESSION: No evidence of DVT within either lower extremity. Electronically Signed   By: Sandi Mariscal M.D.   On: 10/28/2019 16:50   DG CHEST PORT 1 VIEW  Result Date: 11/02/2019 CLINICAL DATA:  Shortness of breath, productive cough. EXAM: PORTABLE CHEST 1 VIEW COMPARISON:  October 31, 2019. FINDINGS: The heart size and mediastinal contours are within normal limits. Endotracheal and nasogastric tubes are unchanged in position. No pneumothorax pleural effusion is noted. Right-sided PICC line is now noted with distal tip in expected position of cavoatrial junction. Mild reticular densities are noted throughout both lungs concerning for inflammation or possibly edema. The visualized skeletal structures are unremarkable. IMPRESSION: Interval placement of right-sided PICC line with distal tip in expected position of cavoatrial junction. Mild reticular densities are noted  throughout both lungs concerning for inflammation or possibly edema. Electronically Signed   By: Marijo Conception M.D.   On: 11/02/2019 12:43   DG CHEST PORT 1 VIEW  Result Date: 10/31/2019 CLINICAL DATA:  Evaluate OG tube placement EXAM: PORTABLE CHEST 1 VIEW COMPARISON:  October 31, 2019 FINDINGS: The OG tube terminates in the left side of the abdomen, within the stomach. Stable cardiomegaly. The hila and  mediastinum are unchanged. Bilateral pulmonary opacities in the bases are similar to mildly worsened in the interval. No other changes. IMPRESSION: 1. The OG tube terminates in the stomach. 2. Bibasilar infiltrates remain, stable to mildly worsened. 3. The ET tube is been removed in the interval. Electronically Signed   By: Dorise Bullion III M.D   On: 10/31/2019 16:10   DG CHEST PORT 1 VIEW  Result Date: 10/31/2019 CLINICAL DATA:  Pt. On a vent.   Acute hypoxic respiratory failure. EXAM: PORTABLE CHEST 1 VIEW COMPARISON:  Chest radiograph 10/29/2019 FINDINGS: Stable support apparatus. Unchanged cardiomediastinal contours. There is increased consolidation at the left lung base. Persistent airspace opacities in the right mid to lower lung. No pneumothorax. No definite effusion. No acute osseous finding. IMPRESSION: 1. Increasing consolidation at the left lung base. 2. Persistent airspace opacities in the right mid to lower lung. Electronically Signed   By: Audie Pinto M.D.   On: 10/31/2019 13:27   DG CHEST PORT 1 VIEW  Result Date: 10/29/2019 CLINICAL DATA:  34 year old female with respiratory failure hypoxia. EXAM: PORTABLE CHEST 1 VIEW COMPARISON:  Chest radiograph dated 10/28/2019 and CT dated 10/28/2019. FINDINGS: Endotracheal tube above the carina and enteric tube extends below the diaphragm with tip the on the inferior margin of the image and side-port in the region of the stomach. Bilateral mid to lower lung field airspace opacities similar to prior radiograph. No large pleural effusion. No  pneumothorax. Mild cardiomegaly. No acute osseous pathology. IMPRESSION: 1. No significant interval change in bilateral mid to lower lung field airspace opacities. 2. Stable support apparatus. Electronically Signed   By: Anner Crete M.D.   On: 10/29/2019 03:46   DG Chest Portable 1 View  Result Date: 10/28/2019 CLINICAL DATA:  Status post intubation today. EXAM: PORTABLE CHEST 1 VIEW COMPARISON:  Single-view of the chest 10/27/2019. FINDINGS: New NG tube is in place in good position with the side-port in the stomach. Endotracheal tube is also in good position with the tip just below the clavicular heads. Right worse than left airspace disease has progressed since yesterday's exam. No pneumothorax or pleural fluid. Heart size normal. IMPRESSION: ETT and NG tube in good position. Worsened airspace disease consistent with progressive pneumonia. Electronically Signed   By: Inge Rise M.D.   On: 10/28/2019 11:42   DG Chest Port 1 View  Result Date: 10/27/2019 CLINICAL DATA:  Shortness of breath. EXAM: PORTABLE CHEST 1 VIEW COMPARISON:  January 09, 2015. FINDINGS: The heart size and mediastinal contours are within normal limits. No pneumothorax or pleural effusion is noted. Multiple ill-defined airspace opacities are noted bilaterally concerning for multifocal pneumonia. The visualized skeletal structures are unremarkable. IMPRESSION: Bilateral multifocal pneumonia. Electronically Signed   By: Marijo Conception M.D.   On: 10/27/2019 14:27   ECHOCARDIOGRAM COMPLETE  Result Date: 10/28/2019    ECHOCARDIOGRAM REPORT   Patient Name:   HALI BALGOBIN Date of Exam: 10/28/2019 Medical Rec #:  [id number]       Height:       64.0 in Accession #:    [id number]      Weight:       120.0 lb Date of Birth:  07-14-1985        BSA:          1.575 m Patient Age:    34 years        BP:           130/94 mmHg Patient Gender: F  HR:           116 bpm. Exam Location:  [facility] Procedure: 2D Echo, Cardiac  Doppler and Color Doppler Indications:    Pulmonary Embolus 415.19 / I26.99  History:        Patient has no prior history of Echocardiogram examinations.                 Multifocal pneumonia, Acute respiratory failure with hypoxia.  Sonographer:    Alvino Chapel RCS Referring Phys: [phone] Xzaria Teo IMPRESSIONS  1. Left ventricular ejection fraction, by estimation, is 55 to 60%. The left ventricle has normal function. The left ventricle has no regional wall motion abnormalities. Left ventricular diastolic parameters are indeterminate.  2. Right ventricular systolic function is moderately reduced. The right ventricular size is severely enlarged. There is moderately elevated pulmonary artery systolic pressure.  3. The mitral valve is normal in structure. Mild mitral valve regurgitation. No evidence of mitral stenosis.  4. The aortic valve has an indeterminant number of cusps. Aortic valve regurgitation is not visualized. No aortic stenosis is present. FINDINGS  Left Ventricle: Left ventricular ejection fraction, by estimation, is 55 to 60%. The left ventricle has normal function. The left ventricle has no regional wall motion abnormalities. The left ventricular internal cavity size was normal in size. There is  no left ventricular hypertrophy. Left ventricular diastolic parameters are indeterminate. Right Ventricle: There is septal flattening in systole. The right ventricular size is severely enlarged. No increase in right ventricular wall thickness. Right ventricular systolic function is moderately reduced. There is moderately elevated pulmonary artery systolic pressure. The tricuspid regurgitant velocity is 3.23 m/s, and with an assumed right atrial pressure of 10 mmHg, the estimated right ventricular systolic pressure is 75.1 mmHg. Left Atrium: Left atrial size was normal in size. Right Atrium: Right atrial size was normal in size. Pericardium: There is no evidence of pericardial effusion. Mitral Valve: The  mitral valve is normal in structure. Mild mitral valve regurgitation. No evidence of mitral valve stenosis. Tricuspid Valve: The tricuspid valve is normal in structure. Tricuspid valve regurgitation is mild . No evidence of tricuspid stenosis. Aortic Valve: The aortic valve has an indeterminant number of cusps. Aortic valve regurgitation is not visualized. No aortic stenosis is present. Aortic valve mean gradient measures 2.7 mmHg. Aortic valve peak gradient measures 4.9 mmHg. Aortic valve area, by VTI measures 1.50 cm. Pulmonic Valve: The pulmonic valve was not well visualized. Pulmonic valve regurgitation is not visualized. No evidence of pulmonic stenosis. Aorta: The aortic root is normal in size and structure. Venous: IVC assessment for right atrial pressure unable to be performed due to mechanical ventilation. IAS/Shunts: No atrial level shunt detected by color flow Doppler.  LEFT VENTRICLE PLAX 2D LVIDd:         3.94 cm  Diastology LVIDs:         2.98 cm  LV e' lateral:   19.90 cm/s LV PW:         0.80 cm  LV E/e' lateral: 4.6 LV IVS:        0.83 cm  LV e' medial:    14.40 cm/s LVOT diam:     1.70 cm  LV E/e' medial:  6.3 LV SV:         27 LV SV Index:   17 LVOT Area:     2.27 cm  RIGHT VENTRICLE RV S prime:     15.70 cm/s TAPSE (M-mode): 2.2 cm LEFT ATRIUM  Index       RIGHT ATRIUM           Index LA diam:      2.20 cm 1.40 cm/m  RA Area:     17.40 cm LA Vol (A4C): 34.9 ml 22.16 ml/m RA Volume:   48.50 ml  30.80 ml/m  AORTIC VALVE AV Area (Vmax):    1.78 cm AV Area (Vmean):   1.50 cm AV Area (VTI):     1.50 cm AV Vmax:           110.51 cm/s AV Vmean:          78.791 cm/s AV VTI:            0.178 m AV Peak Grad:      4.9 mmHg AV Mean Grad:      2.7 mmHg LVOT Vmax:         86.50 cm/s LVOT Vmean:        52.200 cm/s LVOT VTI:          0.117 m LVOT/AV VTI ratio: 0.66  AORTA Ao Root diam: 3.10 cm MITRAL VALVE               TRICUSPID VALVE MV Area (PHT): 6.96 cm    TR Peak grad:   41.7 mmHg MV  Decel Time: 109 msec    TR Vmax:        323.00 cm/s MV E velocity: 90.90 cm/s                            SHUNTS                            Systemic VTI:  0.12 m                            Systemic Diam: 1.70 cm Carlyle Dolly MD Electronically signed by Carlyle Dolly MD Signature Date/Time: 10/28/2019/3:31:55 PM    Final    [place] EKG SITE RITE  Result Date: 11/01/2019 If Site Rite image not attached, placement could not be confirmed due to current cardiac rhythm.   CBC Recent Labs  Lab 10/31/19 0419 11/01/19 0623 11/02/19 0713 11/03/19 0219 11/04/19 0454  WBC 7.7 9.4 10.6* 11.1* 10.0  HGB 7.8* 7.8* 8.1* 7.9* 8.2*  HCT 25.3* 24.9* 24.7* 25.7* 27.2*  PLT 62* 119* 223 258 338  MCV 87.8 88.6 88.8 88.6 88.9  MCH 27.1 27.8 29.1 27.2 26.8  MCHC 30.8 31.3 32.8 30.7 30.1  RDW 19.9* 20.3* 21.2* 20.6* 19.9*   Chemistries  Recent Labs  Lab 10/29/19 0408 10/29/19 0408 10/30/19 0354 10/30/19 0354 10/30/19 1139 10/30/19 1331 10/30/19 1331 10/31/19 0419 11/01/19 0623 11/02/19 0713 11/03/19 0219 11/04/19 0454  NA 138   < > 140   < >  --  141   < > 141 143 140 141 141  K 3.9   < > 3.4*   < >  --  3.1*   < > 3.8 2.8* 2.7* 3.1* 3.6  CL 113*   < > 111   < >  --  112*   < > 109 107 104 103 103  CO2 17*   < > 23   < >  --  23   < > _0 GLUCOSE 150*   < > 124*   < >  --  101*   < > 110* 106* 96 104* 117*  BUN 24*   < > 11   < >  --  9   < > _0 CREATININE 0.71   < > 0.50   < >  --  0.53   < > 0.42* 0.37* 0.35* 0.35* 0.46  CALCIUM 7.7*   < > 7.7*   < >  --  7.5*   < > 7.6* 7.6* 7.2* 7.7* 8.4*  MG  --   --   --   --  1.9  --   --   --   --  1.6*  --   --   AST 123*  --  32  --   --  34  --   --   --   --   --   --   ALT 59*  --  43  --   --  43  --   --   --   --   --   --   ALKPHOS 130*  --  97  --   --  92  --   --   --   --   --   --   BILITOT 0.6  --  0.4  --   --  0.6  --   --   --   --   --   --    < > = values in this interval not displayed.    ------------------------------------------------------------------------------------------------------------------ Recent Labs    11/02/19 0713 11/03/19 0219  TRIG 926* 222*    No results found for: HGBA1C ------------------------------------------------------------------------------------------------------------------ No results for input(s): TSH, T4TOTAL, T3FREE, THYROIDAB in the last 72 hours.  Invalid input(s): FREET3 ------------------------------------------------------------------------------------------------------------------ No results for input(s): VITAMINB12, FOLATE, FERRITIN, TIBC, IRON, RETICCTPCT in the last 72 hours.  Coagulation profile Recent Labs  Lab 10/30/19 0354 10/30/19 1331 10/31/19 0419 11/01/19 0623 11/02/19 0713  INR 1.4* 1.4* 1.3* 1.2 1.1    No results for input(s): DDIMER in the last 72 hours.  Cardiac Enzymes No results for input(s): CKMB, TROPONINI, MYOGLOBIN in the last 168 hours.  Invalid input(s): CK ------------------------------------------------------------------------------------------------------------------    Component Value Date/Time   BNP 271.0 (H) 10/27/2019 1438   Roxan Hockey M.D on 11/04/2019 at 12:01 PM  Go to [email or web address] - for contact info  [facility] - Office  [phone]

## 2019-11-04 NOTE — Progress Notes (Addendum)
Physical Therapy Treatment Patient Details Name: Diane Norton MRN: [id number] DOB: 07-02-1985 Today's Date: 11/04/2019    History of Present Illness Diane Norton is a 34 y.o. female with medical history significant for asthma, seizures, tobacco abuse.  Patient was brought to the ED via EMS for reports of increasing difficulty breathing over the past 4 days.  She also reports a dry cough.  Also reported possible fevers and chills.  She smokes 2 pack of cigarettes daily.She denies chest pain, no leg swelling, no recent trips.  Patient got Pfizer Covid vaccine 2 doses in March.  No known sick contacts.  No vomiting.  She reports some mild wheezing.  She has never required intubation for asthma before, or been this ill before.On EMS arrival, patient's O2 sats was 65%, she did not appear to be in any distress.    PT Comments    The patient's O2 sat on RA was 93% at rest and 88% after ambulation, which quickly rose to 94% with rest at EOS. The patient was able to perform supine to sit with modified independence. She had dizziness upon sitting EOB without LOB which resolved within 3 minutes. The patient performed sit to stand transfer, but required min/mod assist in order to regain balance- the patient appeared unaware of these deficits. The patient ambulated 120 feet RW min assist, demonstrating ataxic gait with increased stride length and irregular foot placement. Patient tolerated sitting up in chair with friend present at bedside after therapy. PLAN: The patient will continue to benefit from skilled physical therapy services in hospital at recommended venue below in order to improve balance, gait, and ADL's to promote independence in functional activities.     Follow Up Recommendations  SNF;Supervision/Assistance - 24 hour     Equipment Recommendations  Rolling walker with 5" wheels    Recommendations for Other Services       Precautions / Restrictions Precautions Precautions:  Fall Restrictions Weight Bearing Restrictions: No    Mobility  Bed Mobility Overal bed mobility: Modified Independent Bed Mobility: Supine to Sit     Supine to sit: Modified independent (Device/Increase time)     General bed mobility comments: able to complete supine to sit on own but requires increased time  Transfers Overall transfer level: Needs assistance Equipment used: Rolling walker (2 wheeled) Transfers: Sit to/from Stand Sit to Stand: Min assist         General transfer comment: unsteady on feet, increased sway upon standing, near LOB, min assist in order to re-establish standing balance  Ambulation/Gait Ambulation/Gait assistance: Min assist Gait Distance (Feet): 120 Feet Assistive device: Rolling walker (2 wheeled) Gait Pattern/deviations: Step-through pattern;Wide base of support;Ataxic (decreased coordination w RW, ataxic foot placement)     General Gait Details: increase in step length bilaterally, mild ataxia   Stairs             Wheelchair Mobility    Modified Rankin (Stroke Patients Only)       Balance Overall balance assessment: Needs assistance Sitting-balance support: Bilateral upper extremity supported;Feet supported Sitting balance-Leahy Scale: Fair Sitting balance - Comments: dizziness upon sitting, resolved within 3 mintues   Standing balance support: Bilateral upper extremity supported;During functional activity Standing balance-Leahy Scale: Fair Standing balance comment: min assist to correct deviations made during gait and correct for any LOB                            Cognition Arousal/Alertness: Awake/alert  Behavior During Therapy: WFL for tasks assessed/performed Overall Cognitive Status: Within Functional Limits for tasks assessed                                        Exercises      General Comments        Pertinent Vitals/Pain Pain Assessment: No/denies pain    Home Living                       Prior Function            PT Goals (current goals can now be found in the care plan section) Acute Rehab PT Goals Patient Stated Goal: go home PT Goal Formulation: With patient Time For Goal Achievement: 11/17/19 Potential to Achieve Goals: Good Progress towards PT goals: Progressing toward goals    Frequency    Min 3X/week      PT Plan Current plan remains appropriate    Co-evaluation              AM-PAC PT "6 Clicks" Mobility   Outcome Measure  Help needed turning from your back to your side while in a flat bed without using bedrails?: None Help needed moving from lying on your back to sitting on the side of a flat bed without using bedrails?: None Help needed moving to and from a bed to a chair (including a wheelchair)?: A Little Help needed standing up from a chair using your arms (e.g., wheelchair or bedside chair)?: A Little Help needed to walk in hospital room?: A Little Help needed climbing 3-5 steps with a railing? : A Lot 6 Click Score: 19    End of Session Equipment Utilized During Treatment: Gait belt Activity Tolerance: Patient tolerated treatment well Patient left: in chair;with call bell/phone within reach;with chair alarm set;with family/visitor present Nurse Communication: Mobility status PT Visit Diagnosis: Unsteadiness on feet (R26.81);Other abnormalities of gait and mobility (R26.89);Muscle weakness (generalized) (M62.81);Ataxic gait (R26.0)     Time: 5573-2202 PT Time Calculation (min) (ACUTE ONLY): 23 min  Charges:  $Gait Training: 8-22 mins $Therapeutic Activity: 8-22 mins                     3:57 PM , 11/04/19 Lorin Picket, SPT Physical Therapy with [place]  [facility] [phone] office  During this treatment session, the therapist was present, participating in and directing the treatment.  3:57 PM, 11/04/19 Ocie Bob, MPT Physical Therapist with [facility] [address] [phone] office [phone] mobile phone

## 2019-11-04 NOTE — Progress Notes (Signed)
ANTICOAGULATION CONSULT NOTE   Pharmacy Consult for Heparin --> eliquis Indication: pulmonary embolus  Allergies  Allergen Reactions  . Bactrim [Sulfamethoxazole-Trimethoprim] Nausea And Vomiting  . Ciprofloxacin Nausea And Vomiting  . Darvocet [Propoxyphene N-Acetaminophen] Hives  . Other     darvocet  . Penicillins Itching  . Penicillins Hives  . Bactrim [Sulfamethoxazole-Trimethoprim] Rash  . Ciprofloxacin Rash    Patient Measurements: Height: 5\' 8"  (172.7 cm) Weight: 62.7 kg (138 lb 3.7 oz) IBW/kg (Calculated) : 63.9 HEPARIN DW (KG): 54.4  Vital Signs: Temp: 99.8 F (37.7 C) (09/01 0412) Temp Source: Oral (09/01 0412) BP: 119/60 (09/01 0700) Pulse Rate: 66 (09/01 0700)  Labs: Recent Labs    11/02/19 0713 11/02/19 1930 11/03/19 0219 11/03/19 0944 11/04/19 0454  HGB 8.1*  --  7.9*  --  8.2*  HCT 24.7*  --  25.7*  --  27.2*  PLT 223  --  258  --  338  APTT 65*  --   --   --   --   LABPROT 13.8  --   --   --   --   INR 1.1  --   --   --   --   HEPARINUNFRC 0.26*   < > 0.45 0.35 0.29*  CREATININE 0.35*  --  0.35*  --  0.46   < > = values in this interval not displayed.    Estimated Creatinine Clearance: 98.1 mL/min (by C-G formula based on SCr of 0.46 mg/dL).  Assessment: Diane Norton a 34 y.o. female requires anticoagulation with a heparin iv infusion for the indication of  pulmonary embolus. Heparin gtt will be started following pharmacy protocol per pharmacy consult.  Extubated and tolerating orals, transition heparin to eliquis for PE  Goal of Therapy:  Heparin level 0.3-0.7 units/ml Monitor platelets by anticoagulation protocol: Yes   Plan:  Eliquis 10mg  po bid x 7 days, then 5mg  po BID Educate on eliquis Continue to monitor H&H and s/s of bleeding  Thank you for involving pharmacy in this patient's care.  20, BS Pharm D, Clinical Pharmacist Pager [phone] 11/04/2019 9:48 AM

## 2019-11-04 NOTE — Progress Notes (Signed)
[name], MRN:  [id number], DOB:  [birth year], LOS: 8 ADMISSION DATE:  10/27/2019, CONSULTATION DATE:  8/25 REFERRING MD:  Sherrilyn Rist, CHIEF COMPLAINT:  Acute resp failure   Brief History   34 yowf smoker with h/o asthma with 4 days of worse sob PTA assoc dry cough ? Fever/multiple chills presented with sats in 60s improved on 02 initially then req intubation 8/25 and PCCM asked to eval at that point  Patient got Pfizer Covid vaccine 2 doses in March.     Past Medical History  Asthma SZ   Significant Hospital Events   8/26 >> heparin stopped due to drop in plts, restarted 8/28  Consults:  PCCM 8/25  Procedures:  Oral ET  8/25 >>> 8/31  Significant Diagnostic Tests:  CTa 8/25 >>> 1. Small nonocclusive pulmonary emboli in the lungs bilaterally, as above. The appearance of the lungs is highly concerning for severe multilobar bilateral pneumonia Echo 8/25 1. Left ventricular ejection fraction, by estimation, is 55 to 60%.  2. Right ventricular systolic function is moderately reduced. The right  ventricular size is severely enlarged. There is moderately elevated  pulmonary artery systolic pressure.   Venous dopplers 8/25 neg    Micro Data:  BC x 2  8/24 >> ng MRSA PCR  8/24  Covid 19 PCR  8/24  And 8/26  Neg x 3  PCT   8/24    2.08  Urine legionella   8/25  Neg  Urine Strep  8/25  Neg  resp 8/25  >> nml flora   Antimicrobials:  Vanc 8/24  Rocephin 8/24 >>> 8/27 Zmax 8/24 >>> 8/27  Doxy 8/27 >>> 8/30  Meropenem 8/27 >>>     Interim history/subjective:   Recovering from critical illness She was extubated yesterday and has tolerated well Remains on Precedex drip, calm and interactive Afebrile   Objective   Blood pressure 120/69, pulse (!) 57, temperature 99 F (37.2 C), temperature source Oral, resp. rate (!) 24, height 5\' 8"  (1.727 m), weight 62.7 kg, last menstrual period 10/26/2019, SpO2 93 %.        Intake/Output Summary (Last 24  hours) at 11/04/2019 1423 Last data filed at 11/04/2019 1100 Gross per 24 hour  Intake 1240.89 ml  Output 3300 ml  Net -2059.11 ml   Filed Weights   11/01/19 0300 11/02/19 0331 11/04/19 0500  Weight: 66.5 kg 68 kg 62.7 kg    Examination: Gen:      Dingeman, no distress  HEENT:  EOMI, sclera anicteric, mild pallor Neck:     No JVD; no thyromegaly Lungs: Clear bilateral, no accessory muscle use CV:         Regular rate and rhythm; no murmurs Abd:      + bowel sounds; soft, non-tender; no palpable masses, no distension Ext:    No edema; adequate peripheral perfusion Skin:      Warm and dry; no rash Neuro: alert and oriented x 3 , calm on Precedex, nonfocal      CXR 9/1 independently reviewed, persistent bilateral coarse opacities, improved from prior  Labs show improved hypokalemia, no leukocytosis, stable anemia    Resolved Hospital Problem list    Thromocytopenia   Assessment & Plan:  1)  Acute CAP in pt with Asthma/ active wheezing  DD includes heroin induced non-cardiogenic edema -Can discontinue meropenem since clinically improved   2) Acute resp failure -extubated , dialed down oxygen    3) Multiple PE not typically  assoc with ards pattern x in pts with collagen vasc dz eg lupus / Antiphospholipid Syndrome (APS)  -  venous dopplers  Neg 8/25 - Echo suggests PE may not be  acute but definitely affecting RH function  - Can transition from heparin to DOAC for 6 months    4) Acute encephalopathy - ? Heroin withdrawal contributing - high triglycerides related to propofol and does not need treatment once this was stopped -Taper Precedex to off  -Watch for opiate withdrawal    5) Anemia   Lab Results  Component Value Date   HGB 8.2 (L) 11/04/2019   HGB 7.9 (L) 11/03/2019   HGB 8.1 (L) 11/02/2019    C/w critical ilness ? Some alveolar hemmorrhage as well as no other obvious bleeding source Rec > tx for  hgb < 7 unless hemodynamically unstable    Hypomagnesemia/hypokalemia -repleted  Hopefully she will make progress with PT and not require oxygen on discharge. I discussed that her pulmonary issue may have been related to heroin use PCCM will be available as needed  Labs   CBC: Recent Labs  Lab 10/31/19 0419 11/01/19 0623 11/02/19 0713 11/03/19 0219 11/04/19 0454  WBC 7.7 9.4 10.6* 11.1* 10.0  HGB 7.8* 7.8* 8.1* 7.9* 8.2*  HCT 25.3* 24.9* 24.7* 25.7* 27.2*  MCV 87.8 88.6 88.8 88.6 88.9  PLT 62* 119* 223 258 338    Basic Metabolic Panel: Recent Labs  Lab 10/30/19 1139 10/30/19 1331 10/30/19 1638 10/31/19 0419 10/31/19 1644 11/01/19 0623 11/02/19 0713 11/03/19 0219 11/04/19 0454  NA  --    < >  --  141  --  143 140 141 141  K  --    < >  --  3.8  --  2.8* 2.7* 3.1* 3.6  CL  --    < >  --  109  --  107 104 103 103  CO2  --    < >  --  24  --  25 27 28 26   GLUCOSE  --    < >  --  110*  --  106* 96 104* 117*  BUN  --    < >  --  10  --  13 13 14 14   CREATININE  --    < >  --  0.42*  --  0.37* 0.35* 0.35* 0.46  CALCIUM  --    < >  --  7.6*  --  7.6* 7.2* 7.7* 8.4*  MG 1.9  --   --   --   --   --  1.6*  --   --   PHOS 1.2*  --  1.7* 2.7 2.5  --   --   --   --    < > = values in this interval not displayed.   GFR: Estimated Creatinine Clearance: 98.1 mL/min (by C-G formula based on SCr of 0.46 mg/dL). Recent Labs  Lab 10/29/19 1157 10/29/19 1337 10/30/19 0354 10/30/19 1331 10/31/19 0419 10/31/19 0419 11/01/19 0623 11/02/19 0713 11/03/19 0219 11/04/19 0454  PROCALCITON  --  0.81 4.68  --  0.21  --   --   --   --   --   WBC   < >  --  10.4   < > 7.7   < > 9.4 10.6* 11.1* 10.0   < > = values in this interval not displayed.    Liver Function Tests: Recent Labs  Lab 10/29/19 0408 10/30/19 0354 10/30/19 1331  AST 123* 32 34  ALT 59* 43 43  ALKPHOS 130* 97 92  BILITOT 0.6 0.4 0.6  PROT 5.3* 5.1* 4.7*  ALBUMIN 2.6* 2.6* 2.4*   No results for input(s): LIPASE, AMYLASE in the last 168 hours. No  results for input(s): AMMONIA in the last 168 hours.  ABG    Component Value Date/Time   PHART 7.471 (H) 10/31/2019 0400   PCO2ART 37.5 10/31/2019 0400   PO2ART 142 (H) 10/31/2019 0400   HCO3 27.7 10/31/2019 0400   ACIDBASEDEF 6.2 (H) 10/29/2019 0340   O2SAT 98.6 10/31/2019 0400     Coagulation Profile: Recent Labs  Lab 10/30/19 0354 10/30/19 1331 10/31/19 0419 11/01/19 0623 11/02/19 0713  INR 1.4* 1.4* 1.3* 1.2 1.1    Cardiac Enzymes: No results for input(s): CKTOTAL, CKMB, CKMBINDEX, TROPONINI in the last 168 hours.  HbA1C: No results found for: HGBA1C  CBG: Recent Labs  Lab 11/03/19 0754 11/03/19 1158 11/03/19 1639 11/03/19 2014 11/04/19 1209  GLUCAP 110* 123* 110* 110* 114*       Cyril Mourning MD. FCCP. [facility]  If no response to pager , please call [phone]  After 7:00 pm call Elink  [phone]   11/04/2019

## 2019-11-04 NOTE — Progress Notes (Addendum)
ANTICOAGULATION CONSULT NOTE   Pharmacy Consult for Heparin  Indication: pulmonary embolus  Allergies  Allergen Reactions  . Bactrim [Sulfamethoxazole-Trimethoprim] Nausea And Vomiting  . Ciprofloxacin Nausea And Vomiting  . Darvocet [Propoxyphene N-Acetaminophen] Hives  . Other     darvocet  . Penicillins Itching  . Penicillins Hives  . Bactrim [Sulfamethoxazole-Trimethoprim] Rash  . Ciprofloxacin Rash    Patient Measurements: Height: 5\' 8"  (172.7 cm) Weight: 62.7 kg (138 lb 3.7 oz) IBW/kg (Calculated) : 63.9 HEPARIN DW (KG): 54.4  Vital Signs: Temp: 99.8 F (37.7 C) (09/01 0412) Temp Source: Oral (09/01 0412) BP: 119/63 (09/01 0600) Pulse Rate: 66 (09/01 0600)  Labs: Recent Labs    11/02/19 0713 11/02/19 1930 11/03/19 0219 11/03/19 0944 11/04/19 0454  HGB 8.1*  --  7.9*  --  8.2*  HCT 24.7*  --  25.7*  --  27.2*  PLT 223  --  258  --  338  APTT 65*  --   --   --   --   LABPROT 13.8  --   --   --   --   INR 1.1  --   --   --   --   HEPARINUNFRC 0.26*   < > 0.45 0.35 0.29*  CREATININE 0.35*  --  0.35*  --  0.46   < > = values in this interval not displayed.    Estimated Creatinine Clearance: 98.1 mL/min (by C-G formula based on SCr of 0.46 mg/dL).  Assessment: Diane Norton a 34 y.o. female requires anticoagulation with a heparin iv infusion for the indication of  pulmonary embolus. Heparin gtt will be started following pharmacy protocol per pharmacy consult.   Heparin level is slightly subtherapeutic at 0.29 this AM. No bleeding noted. No issues with infusion per RN  Goal of Therapy:  Heparin level 0.3-0.7 units/ml Monitor platelets by anticoagulation protocol: Yes   Plan:  Increase heparin infusion at 1750 units/hr Heparin level in ~6 hours and Daily heparin level, CBC Continue to monitor H&H and s/s of bleeding  Thank you for involving pharmacy in this patient's care.  20, BS Pharm D, BCPS Clinical Pharmacist Pager  [phone] 11/04/2019 8:02 AM

## 2019-11-05 MED ORDER — ADULT MULTIVITAMIN W/MINERALS CH: 1.0000 | ORAL_TABLET | Freq: Every day | ORAL | 2 refills | Status: DC

## 2019-11-05 MED ORDER — TRAZODONE HCL 150 MG PO TABS
150.0000 mg | ORAL_TABLET | Freq: Every day | ORAL | 3 refills | Status: DC
Start: 2019-11-05 — End: 2023-08-15

## 2019-11-05 MED ORDER — OMEPRAZOLE 20 MG PO CPDR: 20.0000 mg | DELAYED_RELEASE_CAPSULE | Freq: Every day | ORAL | 1 refills | Status: DC

## 2019-11-05 MED ORDER — ALBUTEROL SULFATE HFA 108 (90 BASE) MCG/ACT IN AERS: 2.0000 | INHALATION_SPRAY | Freq: Four times a day (QID) | RESPIRATORY_TRACT | 2 refills | Status: DC | PRN

## 2019-11-05 MED ORDER — GUAIFENESIN ER 600 MG PO TB12: 600.0000 mg | ORAL_TABLET | Freq: Two times a day (BID) | ORAL | 0 refills | Status: DC

## 2019-11-05 MED ORDER — ACETAMINOPHEN 325 MG PO TABS: 650.0000 mg | ORAL_TABLET | Freq: Four times a day (QID) | ORAL | 0 refills | Status: DC | PRN

## 2019-11-05 MED ORDER — PREDNISONE 20 MG PO TABS: 40.0000 mg | ORAL_TABLET | Freq: Every day | ORAL | 0 refills | Status: DC

## 2019-11-05 MED ORDER — APIXABAN 5 MG PO TABS: 5.0000 mg | ORAL_TABLET | Freq: Two times a day (BID) | ORAL | 4 refills | Status: DC

## 2019-11-05 MED ORDER — CHLORHEXIDINE GLUCONATE 0.12 % MT SOLN
15.0000 mL | Freq: Two times a day (BID) | OROMUCOSAL | Status: DC
  Administered 2019-11-05: 15 mL via OROMUCOSAL
  Filled 2019-11-05: qty 15

## 2019-11-05 MED ORDER — ELIQUIS DVT/PE STARTER PACK 5 MG PO TBPK: ORAL_TABLET | ORAL | 0 refills | Status: DC

## 2019-11-05 NOTE — Discharge Instructions (Signed)
1) you are taking Eliquis/apixaban which is your blood thinner so please Avoid ibuprofen/Advil/Aleve/Motrin/Goody Powders/Naproxen/BC powders/Meloxicam/Diclofenac/Indomethacin and other Nonsteroidal anti-inflammatory medications as these will make you more likely to bleed and can cause stomach ulcers, can also cause Kidney problems.   2) complete abstinence from heroin and tobacco strongly advised--- please consider outpatient drug rehab program  3)You need to establish care with a primary care physician--- you need a recheck on your CBC and BMP blood test in 1 to 2 weeks

## 2019-11-05 NOTE — Discharge Summary (Signed)
Diane Norton, is a 34 y.o. female  DOB 1986/02/25  MRN [id number].  Admission date:  10/27/2019  Admitting Physician  Shon Hale, MD  Discharge Date:  11/05/2019   Primary MD  Patient, No Pcp Per  Recommendations for primary care physician for things to follow:   1) you are taking Eliquis/apixaban which is your blood thinner so please Avoid ibuprofen/Advil/Aleve/Motrin/Goody Powders/Naproxen/BC powders/Meloxicam/Diclofenac/Indomethacin and other Nonsteroidal anti-inflammatory medications as these will make you more likely to bleed and can cause stomach ulcers, can also cause Kidney problems.   2) complete abstinence from heroin and tobacco strongly advised--- please consider outpatient drug rehab program  3)You need to establish care with a primary care physician--- you need a recheck on your CBC and BMP blood test in 1 to 2 weeks  Admission Diagnosis  Hypokalemia [E87.6] Acute respiratory failure with hypoxia (HCC) [J96.01] PNA (pneumonia) [J18.9] Multifocal pneumonia [J18.9] Acute pulmonary embolism, unspecified pulmonary embolism type, unspecified whether acute cor pulmonale present (HCC) [I26.99]   Discharge Diagnosis  Hypokalemia [E87.6] Acute respiratory failure with hypoxia (HCC) [J96.01] PNA (pneumonia) [J18.9] Multifocal pneumonia [J18.9] Acute pulmonary embolism, unspecified pulmonary embolism type, unspecified whether acute cor pulmonale present (HCC) [I26.99]    Principal Problem:   Acute Hypoxic respiratory failure (HCC) Active Problems:   DIC (disseminated intravascular coagulation) (HCC)   Acute pulmonary embolism (HCC)   Asthma   Multifocal pneumonia   History of seizures   PNA (pneumonia)   Thrombocytopenia (HCC)      Past Medical History:  Diagnosis Date  . Asthma   . Seizures (HCC)     Past Surgical History:  Procedure Laterality Date  . CHOLECYSTECTOMY    .  FEMUR IM NAIL Right 01/09/2015   Procedure: INTRAMEDULLARY (IM) RETROGRADE FEMORAL NAILING; RIGHT;  Surgeon: Nadara Mustard, MD;  Location: [facility] OR;  Service: Orthopedics;  Laterality: Right;  . ORIF PATELLA Right 01/09/2015   Procedure: OPEN REDUCTION INTERNAL (ORIF) FIXATION PATELLA; RIGHT;  Surgeon: Nadara Mustard, MD;  Location: [facility] OR;  Service: Orthopedics;  Laterality: Right;     HPI  from the history and physical done on the day of admission:    Chief Complaint: Difficulty breathing  HPI: Diane Norton is a 34 y.o. female with medical history significant for asthma, seizures, tobacco abuse.  Patient was brought to the ED via EMS for reports of increasing difficulty breathing over the past 4 days.  She also reports a dry cough.  Also reported possible fevers and chills.  She smokes 2 pack of cigarettes daily. She denies chest pain, no leg swelling, no recent trips.  Patient got Pfizer Covid vaccine 2 doses in March.  No known sick contacts.  No vomiting.  She reports some mild wheezing.  She has never required intubation for asthma before, or been this ill before. On EMS arrival, patient's O2 sats was 65%, she did not appear to be in any distress.  ED Course: temp 98.4, respiratory rate 16 - 24, heart rate 81 - 96, blood  pressure systolic 97-1 08.  O2 sats 70% on 5 L, currently on high flow nasal cough is a 15 L.  Covid test negative.  WBC 13.8.  Potassium 2.7.  Magnesium normal 2.  Normal lactic acid 1.3.  Inflammatory markers checked, D-dimer markedly elevated at greater than 20.  Procalcitonin 2. Portable chest x-ray shows bilateral multifocal pneumonia.  Started on IV vancomycin, ceftriaxone and azithromycin.  Started on IV fluids.  Potassium repleted.  Magnesium given.  CTA chest ordered and pending.  Hospitalist to admit for further evaluation and management.  Review of Systems: As per HPI all other systems reviewed and negative.    Hospital Course:   Brief summary 34 year old  Caucasian female with past medical history relevant for ongoing heroin abuse, 2 pack-a-day smoker with COPD/emphysema, history of underlying asthma, history of seizures with no recent seizure episodes and history of completing COVID-19 vaccination [employer]) in  April 2021 admitted on 10/27/2019 with hypoxic respiratory failure secondary to combination of to bilateral nonocclusive PE, bilateral multifocal pneumonia and COPD exacerbation--- hypoxic respiratory failure worsened, patient was confused and not compliant with oxygen therapy, intubated 10/28/2019 -Currently on IV propofol and IV dilaudid drips --- ADAMTs 13 panel is 59.9 %--- which would be inconsistent with TTP, this is more consistent with DIC   A/p 1)Acute hypoxic respiratory failure----secondary to bilateral pneumonia, COPD/emphysema flareup and bilateral nonocclusive PE---- -intubated 10/28/2019 -Extubated 11/03/2019 -completed COVID-19 vaccination [employer]) in  April 2021 -COVID-19 test negative x 3 times  this admission Initially treated with IV Solu-Medrol , then po prednisone, last dose 11/05/2019 - chest x-ray 11/04/2019--with improved findings ---Clinically and radiologically much improved Hypoxia resolved  2)Bilateral multifocal pneumonia---- intubated for hypoxic respiratory failure as above #1 -Extubated 11/03/2019 --Bronchodilators, -Strep pneumo negative, -Legionella neg PCT 2.08 >>0.81>>4.68---  ---stopped azithromycin and Rocephin on 10/29/2019  --started on meropenem and doxycycline on 10/29/19-- --last dose of antibiotic 11/04/19 ---Clinically and radiologically much improved Hypoxia resolved  3) acute COPD/emphysema exacerbation--- secondary to #2 above, contributing to #1 above --Patient is a 2 pack-a-day smoker -Steroids and antibiotics as above #1 #2  4) heroin addiction--- patient uses heroin on admission at least $50 worth of heroin on a daily basis, she does not inject apparently she snorts heroin -treated  withlorazepam and as needed Dilaudid - Treated with Precedex drip and propofol drip discontinued -Outpatient drug rehab advised  5)Bilateral nonocclusive pulmonary embolism--- patient received therapeutic dose of Lovenox on 10/27/2019 --Started IV heparin drip on 10/28/2019, discontinued IV heparin on 10/29/2019 due to worsening thrombocytopenia -- given improving fibrinogen, platelet count and improving DIC  profile  -- restarted IV heparin for PE without bolus on 10/31/2019 -Echo with EF of 55 to 60%, with severe right ventricular enlargement and moderate pulmonary hypertension -Lower extremity venous Dopplers negative - Restarted on IV heparin once platelets recovered from DIC and switched to p.o. Eliquis on 11/04/2019 -Discharge home on p.o. Eliquis  6)Social/Ethics---Spoke with Brother --- Carver Fila at [phone] -- Brother unable to visit as he was dxed with covid PNA on 10/10/19 and he was just dced from hospital last week  Pt's brother stated that Diane Norton was not around him recently (not exposed to him while he had covid) --  Spoke with Mother---Ms Charlynne Pander [phone] Updated Both brother and mother----  7) history of seizures--no recent seizures, currently intubated and sedated with propofol along with as needed lorazepam  8) acute on chronic anemia and acute thrombocytopenia/DIC--- on admission on 10/27/2019 platelet count was 217, patient received Lovenox  80 mg subcu x1 on 10/27/2019 -Patient was started on IV heparin for PE on 10/28/2019 in a.m. -patient received therapeutic dose of Lovenox on 10/27/2019 --Started IV heparin drip on 10/28/2019 in am , discontinued IV heparin on 10/29/2019 due to worsening thrombocytopenia -- given improving fibrinogen, platelet count and improving DIC  profile  -- restarted IV heparin for PE without bolus on 10/31/2019 Platelets 217>>142>>116>>39>>37>>38>>45>>61>>119>>223 Hgb 10.8 >>> 8.8 >>7.8>>7.5>.7.8 LDH 664>>594>>532>> 523>>421>>380 Fibrinogen   594>>79>>127>>>175>>213>>349 inr 1.5>>>1.4>>1.3>>1.2>>1.1 -- hematology consult from Dr. [name] appreciated -No obvious bleeding at this time ----HIT and DIC panel results reviewed with Dr. [name] --DIC is more likely than HIT given the clinical presentation and lab findings -He advised stopping IV heparin, he advised against argatroban -- we gave FFP and cryoprecipitate 10/29/19 to reduce bleeding risk -Vitamin K given on 10/30/2019 --Since DIC may be triggered by antibiotics/medications--we will stop azithromycin and Rocephin which was started on admission and instead use meropenem and doxycycline -Patient has multiple allergies limiting choices of antibiotics -- ADAMTs 13 panel is 59.9 --- which would be inconsistent with TTP, this is more consistent with DIC --- I called [facility] hemat[facility] transfer [facility]----[facility] declined to accept patient for transfer due to lack of beds -Overall DIC pathophysiology has resolved at this time  9)Prophylaxis--who provided Protonix for GI prophylaxis  10)FEN/hypokalemia/hypomagnesemia and hypophosphatemia--replace potassium, magnesium and phosphorous --- -patient is eating and drinking well  11) generalized weakness and deconditioning--- physical therapist recommend SNF rehab, case management/discharge planner notified --Physical therapy reevaluated patient and stated is okay to discharge home without further needs  Disposition--discharge home with familyStatus is: Inpatient  Code Status : Full  Family Communication:   -  D/w patient's mother Ms Loa Socks  -and pt Brother Diane Norton at [phone]  Consults  :  PCCM/hematology   Discharge Condition: Stable without hypoxia  Follow UP--- PCP as advised  Diet and Activity recommendation:  As advised  Discharge Instructions    Discharge Instructions    Call MD for:  difficulty breathing, headache or visual disturbances   Complete by: As directed    Call MD for:   persistant dizziness or light-headedness   Complete by: As directed    Call MD for:  persistant nausea and vomiting   Complete by: As directed    Call MD for:  severe uncontrolled pain   Complete by: As directed    Call MD for:  temperature >100.4   Complete by: As directed    Diet general   Complete by: As directed    Discharge instructions   Complete by: As directed    1) you are taking Eliquis/apixaban which is your blood thinner so please Avoid ibuprofen/Advil/Aleve/Motrin/Goody Powders/Naproxen/BC powders/Meloxicam/Diclofenac/Indomethacin and other Nonsteroidal anti-inflammatory medications as these will make you more likely to bleed and can cause stomach ulcers, can also cause Kidney problems.   2) complete abstinence from heroin and tobacco strongly advised--- please consider outpatient drug rehab program  3)You need to establish care with a primary care physician--- you need a recheck on your CBC and BMP blood test in 1 to 2 weeks   Increase activity slowly   Complete by: As directed         Discharge Medications     Allergies as of 11/05/2019      Reactions   Bactrim [sulfamethoxazole-trimethoprim] Nausea And Vomiting   Ciprofloxacin Nausea And Vomiting   Darvocet [propoxyphene N-acetaminophen] Hives   Other    darvocet   Penicillins Itching  Penicillins Hives   Bactrim [sulfamethoxazole-trimethoprim] Rash   Ciprofloxacin Rash      Medication List    STOP taking these medications   aspirin EC 325 MG tablet   cephALEXin 500 MG capsule Commonly known as: KEFLEX   ciprofloxacin 500 MG tablet Commonly known as: CIPRO   clindamycin 150 MG capsule Commonly known as: CLEOCIN   doxycycline 100 MG capsule Commonly known as: VIBRAMYCIN   HYDROcodone-acetaminophen 5-325 MG tablet Commonly known as: NORCO/VICODIN   ibuprofen 400 MG tablet Commonly known as: ADVIL   metroNIDAZOLE 500 MG tablet Commonly known as: FLAGYL   ondansetron 4 MG tablet Commonly  known as: ZOFRAN   oxyCODONE-acetaminophen 5-325 MG tablet Commonly known as: Roxicet   traMADol 50 MG tablet Commonly known as: ULTRAM     TAKE these medications   acetaminophen 325 MG tablet Commonly known as: TYLENOL Take 2 tablets (650 mg total) by mouth every 6 (six) hours as needed for mild pain (or Fever >/= 101).   albuterol 108 (90 Base) MCG/ACT inhaler Commonly known as: VENTOLIN HFA Inhale 2 puffs into the lungs every 6 (six) hours as needed for wheezing or shortness of breath.   Eliquis DVT/PE Starter Pack Generic drug: Apixaban Starter Pack (10mg  and 5mg ) Take as directed on package: start with two-5mg  tablets twice daily for 7 days. On day 8, switch to one-5mg  tablet twice daily.   apixaban 5 MG Tabs tablet Commonly known as: ELIQUIS Take 1 tablet (5 mg total) by mouth 2 (two) times daily. Start on October 1st  after you finish the initial starter pack Start taking on: December 04, 2019   guaiFENesin 600 MG 12 hr tablet Commonly known as: MUCINEX Take 1 tablet (600 mg total) by mouth 2 (two) times daily.   multivitamin with minerals Tabs tablet Take 1 tablet by mouth daily. Start taking on: November 06, 2019   omeprazole 20 MG capsule Commonly known as: PriLOSEC Take 1 capsule (20 mg total) by mouth daily.   predniSONE 20 MG tablet Commonly known as: Deltasone Take 2 tablets (40 mg total) by mouth daily with breakfast.   traZODone 150 MG tablet Commonly known as: DESYREL Take 1 tablet (150 mg total) by mouth at bedtime.       Major procedures and Radiology Reports - PLEASE review detailed and final reports for all details, in brief -      DG Chest 2 View  Result Date: 11/04/2019 CLINICAL DATA:  Recent extubation. Patient states condition improving. Covid negative 8/26. Hx asthma and smoker. EXAM: CHEST - 2 VIEW COMPARISON:  Chest radiograph 11/02/2019 FINDINGS: Interval extubation and removal of a nasogastric tube. A right upper extremity PICC  remains in place. Improved lung volumes. There are persistent coarse bilateral opacities. Mild heterogeneous opacities at the left base likely atelectasis or residual infection. No pneumothorax or large pleural effusion. No acute finding in the visualized skeleton. IMPRESSION: Improved lung volumes with persistent coarse bilateral opacities and probable left basilar atelectasis/residual infection. Electronically Signed   By: Emmaline Kluver M.D.   On: 11/04/2019 11:44   CT Angio Chest PE W and/or Wo Contrast  Result Date: 10/28/2019 CLINICAL DATA:  34 year old female with history of shortness of breath for the past 4 days. Fever. EXAM: CT ANGIOGRAPHY CHEST WITH CONTRAST TECHNIQUE: Multidetector CT imaging of the chest was performed using the standard protocol during bolus administration of intravenous contrast. Multiplanar CT image reconstructions and MIPs were obtained to evaluate the vascular anatomy. CONTRAST:  75mL OMNIPAQUE  IOHEXOL 350 MG/ML SOLN COMPARISON:  Chest CT 01/09/2015. FINDINGS: Cardiovascular: Tiny nonobstructive filling defects are noted within subsegmental pulmonary artery branches to the right lower lobe (axial image 194 of series 5) and the left upper lobe (axial image 155 of series 5). No other larger central, lobar or segmental sized filling defects are noted. Heart size is normal. There is no significant pericardial fluid, thickening or pericardial calcification. No atherosclerotic calcifications in the thoracic aorta or coronary arteries. Mediastinum/Nodes: No pathologically enlarged mediastinal or hilar lymph nodes. Esophagus is unremarkable in appearance. No axillary lymphadenopathy. Lungs/Pleura: Widespread areas of severe ground-glass attenuation are noted scattered throughout the lungs bilaterally. No pleural effusions. Mild diffuse bronchial wall thickening with moderate centrilobular and paraseptal emphysema. Upper Abdomen: Unremarkable. Musculoskeletal: There are no aggressive  appearing lytic or blastic lesions noted in the visualized portions of the skeleton. Review of the MIP images confirms the above findings. IMPRESSION: 1. Small nonocclusive pulmonary emboli in the lungs bilaterally, as above. 2. The appearance of the lungs is highly concerning for severe multilobar bilateral pneumonia, likely from atypical viral etiologies such as COVID infection. 3. Diffuse bronchial wall thickening with moderate centrilobular and paraseptal emphysema; imaging findings suggestive of underlying COPD. These results will be called to the ordering clinician or representative by the Radiologist Assistant, and communication documented in the PACS or [employer]. Emphysema (ICD10-J43.9). Electronically Signed   By: Trudie Reed M.D.   On: 10/28/2019 09:37   US Abdomen Limited  Result Date: 10/29/2019 CLINICAL DATA:  Elevated LFTs EXAM: ULTRASOUND ABDOMEN LIMITED RIGHT UPPER QUADRANT COMPARISON:  January 11, 2019. FINDINGS: Gallbladder: Status post cholecystectomy. Common bile duct: Diameter: 2 mm, normal Liver: No focal lesion identified. Mildly heterogeneous liver echotexture. Portal vein is patent on color Doppler imaging with normal direction of blood flow towards the liver. Other: None. IMPRESSION: Mildly heterogeneous liver echotexture without focal lesion identified. Electronically Signed   By: Meda Klinefelter MD   On: 10/29/2019 09:36   US Venous Img Lower Bilateral (DVT)  Result Date: 10/28/2019 CLINICAL DATA:  Acute respiratory failure. Small volume bilateral pulmonary embolism. Evaluate for DVT. EXAM: BILATERAL LOWER EXTREMITY VENOUS DOPPLER ULTRASOUND TECHNIQUE: Gray-scale sonography with graded compression, as well as color Doppler and duplex ultrasound were performed to evaluate the lower extremity deep venous systems from the level of the common femoral vein and including the common femoral, femoral, profunda femoral, popliteal and calf veins including the posterior  tibial, peroneal and gastrocnemius veins when visible. The superficial great saphenous vein was also interrogated. Spectral Doppler was utilized to evaluate flow at rest and with distal augmentation maneuvers in the common femoral, femoral and popliteal veins. COMPARISON:  None. FINDINGS: RIGHT LOWER EXTREMITY Common Femoral Vein: No evidence of thrombus. Normal compressibility, respiratory phasicity and response to augmentation. Saphenofemoral Junction: No evidence of thrombus. Normal compressibility and flow on color Doppler imaging. Profunda Femoral Vein: No evidence of thrombus. Normal compressibility and flow on color Doppler imaging. Femoral Vein: No evidence of thrombus. Normal compressibility, respiratory phasicity and response to augmentation. Popliteal Vein: No evidence of thrombus. Normal compressibility, respiratory phasicity and response to augmentation. Calf Veins: No evidence of thrombus. Normal compressibility and flow on color Doppler imaging. Superficial Great Saphenous Vein: No evidence of thrombus. Normal compressibility. Venous Reflux:  None. Other Findings:  None. LEFT LOWER EXTREMITY Common Femoral Vein: No evidence of thrombus. Normal compressibility, respiratory phasicity and response to augmentation. Saphenofemoral Junction: No evidence of thrombus. Normal compressibility and flow on color Doppler imaging. Profunda Femoral  Vein: No evidence of thrombus. Normal compressibility and flow on color Doppler imaging. Femoral Vein: No evidence of thrombus. Normal compressibility, respiratory phasicity and response to augmentation. Popliteal Vein: No evidence of thrombus. Normal compressibility, respiratory phasicity and response to augmentation. Calf Veins: No evidence of thrombus. Normal compressibility and flow on color Doppler imaging. Superficial Great Saphenous Vein: No evidence of thrombus. Normal compressibility. Venous Reflux:  None. Other Findings:  None. IMPRESSION: No evidence of DVT  within either lower extremity. Electronically Signed   By: Simonne Come M.D.   On: 10/28/2019 16:50   DG CHEST PORT 1 VIEW  Result Date: 11/02/2019 CLINICAL DATA:  Shortness of breath, productive cough. EXAM: PORTABLE CHEST 1 VIEW COMPARISON:  October 31, 2019. FINDINGS: The heart size and mediastinal contours are within normal limits. Endotracheal and nasogastric tubes are unchanged in position. No pneumothorax pleural effusion is noted. Right-sided PICC line is now noted with distal tip in expected position of cavoatrial junction. Mild reticular densities are noted throughout both lungs concerning for inflammation or possibly edema. The visualized skeletal structures are unremarkable. IMPRESSION: Interval placement of right-sided PICC line with distal tip in expected position of cavoatrial junction. Mild reticular densities are noted throughout both lungs concerning for inflammation or possibly edema. Electronically Signed   By: Lupita Raider M.D.   On: 11/02/2019 12:43   DG CHEST PORT 1 VIEW  Result Date: 10/31/2019 CLINICAL DATA:  Evaluate OG tube placement EXAM: PORTABLE CHEST 1 VIEW COMPARISON:  October 31, 2019 FINDINGS: The OG tube terminates in the left side of the abdomen, within the stomach. Stable cardiomegaly. The hila and mediastinum are unchanged. Bilateral pulmonary opacities in the bases are similar to mildly worsened in the interval. No other changes. IMPRESSION: 1. The OG tube terminates in the stomach. 2. Bibasilar infiltrates remain, stable to mildly worsened. 3. The ET tube is been removed in the interval. Electronically Signed   By: Gerome Sam III M.D   On: 10/31/2019 16:10   DG CHEST PORT 1 VIEW  Result Date: 10/31/2019 CLINICAL DATA:  Pt. On a vent.   Acute hypoxic respiratory failure. EXAM: PORTABLE CHEST 1 VIEW COMPARISON:  Chest radiograph 10/29/2019 FINDINGS: Stable support apparatus. Unchanged cardiomediastinal contours. There is increased consolidation at the left lung  base. Persistent airspace opacities in the right mid to lower lung. No pneumothorax. No definite effusion. No acute osseous finding. IMPRESSION: 1. Increasing consolidation at the left lung base. 2. Persistent airspace opacities in the right mid to lower lung. Electronically Signed   By: Emmaline Kluver M.D.   On: 10/31/2019 13:27   DG CHEST PORT 1 VIEW  Result Date: 10/29/2019 CLINICAL DATA:  34 year old female with respiratory failure hypoxia. EXAM: PORTABLE CHEST 1 VIEW COMPARISON:  Chest radiograph dated 10/28/2019 and CT dated 10/28/2019. FINDINGS: Endotracheal tube above the carina and enteric tube extends below the diaphragm with tip the on the inferior margin of the image and side-port in the region of the stomach. Bilateral mid to lower lung field airspace opacities similar to prior radiograph. No large pleural effusion. No pneumothorax. Mild cardiomegaly. No acute osseous pathology. IMPRESSION: 1. No significant interval change in bilateral mid to lower lung field airspace opacities. 2. Stable support apparatus. Electronically Signed   By: Elgie Collard M.D.   On: 10/29/2019 03:46   DG Chest Portable 1 View  Result Date: 10/28/2019 CLINICAL DATA:  Status post intubation today. EXAM: PORTABLE CHEST 1 VIEW COMPARISON:  Single-view of the chest 10/27/2019. FINDINGS: New  NG tube is in place in good position with the side-port in the stomach. Endotracheal tube is also in good position with the tip just below the clavicular heads. Right worse than left airspace disease has progressed since yesterday's exam. No pneumothorax or pleural fluid. Heart size normal. IMPRESSION: ETT and NG tube in good position. Worsened airspace disease consistent with progressive pneumonia. Electronically Signed   By: Drusilla Kanner M.D.   On: 10/28/2019 11:42   DG Chest Port 1 View  Result Date: 10/27/2019 CLINICAL DATA:  Shortness of breath. EXAM: PORTABLE CHEST 1 VIEW COMPARISON:  January 09, 2015. FINDINGS: The  heart size and mediastinal contours are within normal limits. No pneumothorax or pleural effusion is noted. Multiple ill-defined airspace opacities are noted bilaterally concerning for multifocal pneumonia. The visualized skeletal structures are unremarkable. IMPRESSION: Bilateral multifocal pneumonia. Electronically Signed   By: Lupita Raider M.D.   On: 10/27/2019 14:27   ECHOCARDIOGRAM COMPLETE  Result Date: 10/28/2019    ECHOCARDIOGRAM REPORT   Patient Name:   JANEAN EISCHEN Date of Exam: 10/28/2019 Medical Rec #:  [id number]       Height:       64.0 in Accession #:    [id number]      Weight:       120.0 lb Date of Birth:  September 22, 1985        BSA:          1.575 m Patient Age:    34 years        BP:           130/94 mmHg Patient Gender: F               HR:           116 bpm. [facility] Procedure: 2D Echo, Cardiac Doppler and Color Doppler Indications:    Pulmonary Embolus 415.19 / I26.99  History:        Patient has no prior history of Echocardiogram examinations.                 Multifocal pneumonia, Acute respiratory failure with hypoxia.  Sonographer:    Celesta Gentile RCS Referring Phys: [id number] Trinity Haun IMPRESSIONS  1. Left ventricular ejection fraction, by estimation, is 55 to 60%. The left ventricle has normal function. The left ventricle has no regional wall motion abnormalities. Left ventricular diastolic parameters are indeterminate.  2. Right ventricular systolic function is moderately reduced. The right ventricular size is severely enlarged. There is moderately elevated pulmonary artery systolic pressure.  3. The mitral valve is normal in structure. Mild mitral valve regurgitation. No evidence of mitral stenosis.  4. The aortic valve has an indeterminant number of cusps. Aortic valve regurgitation is not visualized. No aortic stenosis is present. FINDINGS  Left Ventricle: Left ventricular ejection fraction, by estimation, is 55 to 60%. The left ventricle has normal function. The  left ventricle has no regional wall motion abnormalities. The left ventricular internal cavity size was normal in size. There is  no left ventricular hypertrophy. Left ventricular diastolic parameters are indeterminate. Right Ventricle: There is septal flattening in systole. The right ventricular size is severely enlarged. No increase in right ventricular wall thickness. Right ventricular systolic function is moderately reduced. There is moderately elevated pulmonary artery systolic pressure. The tricuspid regurgitant velocity is 3.23 m/s, and with an assumed right atrial pressure of 10 mmHg, the estimated right ventricular systolic pressure is 51.7 mmHg. Left Atrium: Left atrial size  was normal in size. Right Atrium: Right atrial size was normal in size. Pericardium: There is no evidence of pericardial effusion. Mitral Valve: The mitral valve is normal in structure. Mild mitral valve regurgitation. No evidence of mitral valve stenosis. Tricuspid Valve: The tricuspid valve is normal in structure. Tricuspid valve regurgitation is mild . No evidence of tricuspid stenosis. Aortic Valve: The aortic valve has an indeterminant number of cusps. Aortic valve regurgitation is not visualized. No aortic stenosis is present. Aortic valve mean gradient measures 2.7 mmHg. Aortic valve peak gradient measures 4.9 mmHg. Aortic valve area, by VTI measures 1.50 cm. Pulmonic Valve: The pulmonic valve was not well visualized. Pulmonic valve regurgitation is not visualized. No evidence of pulmonic stenosis. Aorta: The aortic root is normal in size and structure. Venous: IVC assessment for right atrial pressure unable to be performed due to mechanical ventilation. IAS/Shunts: No atrial level shunt detected by color flow Doppler.  LEFT VENTRICLE PLAX 2D LVIDd:         3.94 cm  Diastology LVIDs:         2.98 cm  LV e' lateral:   19.90 cm/s LV PW:         0.80 cm  LV E/e' lateral: 4.6 LV IVS:        0.83 cm  LV e' medial:    14.40 cm/s LVOT  diam:     1.70 cm  LV E/e' medial:  6.3 LV SV:         27 LV SV Index:   17 LVOT Area:     2.27 cm  RIGHT VENTRICLE RV S prime:     15.70 cm/s TAPSE (M-mode): 2.2 cm LEFT ATRIUM           Index       RIGHT ATRIUM           Index LA diam:      2.20 cm 1.40 cm/m  RA Area:     17.40 cm LA Vol (A4C): 34.9 ml 22.16 ml/m RA Volume:   48.50 ml  30.80 ml/m  AORTIC VALVE AV Area (Vmax):    1.78 cm AV Area (Vmean):   1.50 cm AV Area (VTI):     1.50 cm AV Vmax:           110.51 cm/s AV Vmean:          78.791 cm/s AV VTI:            0.178 m AV Peak Grad:      4.9 mmHg AV Mean Grad:      2.7 mmHg LVOT Vmax:         86.50 cm/s LVOT Vmean:        52.200 cm/s LVOT VTI:          0.117 m LVOT/AV VTI ratio: 0.66  AORTA Ao Root diam: 3.10 cm MITRAL VALVE               TRICUSPID VALVE MV Area (PHT): 6.96 cm    TR Peak grad:   41.7 mmHg MV Decel Time: 109 msec    TR Vmax:        323.00 cm/s MV E velocity: 90.90 cm/s                            SHUNTS  Systemic VTI:  0.12 m                            Systemic Diam: 1.70 cm Dina Rich MD Electronically signed by Dina Rich MD Signature Date/Time: 10/28/2019/3:31:55 PM    Final    [place] EKG SITE RITE  Result Date: 11/01/2019 If Site Rite image not attached, placement could not be confirmed due to current cardiac rhythm.   Micro Results    Recent Results (from the past 240 hour(s))  Blood Culture (routine x 2)     Status: None   Collection Time: 10/27/19  1:50 PM   Specimen: BLOOD LEFT HAND  Result Value Ref Range Status   Specimen Description BLOOD LEFT HAND  Final   Special Requests   Final    BOTTLES DRAWN AEROBIC AND ANAEROBIC Blood Culture results may not be optimal due to an inadequate volume of blood received in culture bottles   Culture   Final    NO GROWTH 5 DAYS Performed at [facility], [address] [place], [place] [address]    Report Status 11/01/2019 FINAL  Final  SARS Coronavirus 2 by RT PCR (hospital order,  performed in [facility] hospital lab) Nasopharyngeal Nasopharyngeal Swab     Status: None   Collection Time: 10/27/19  2:14 PM   Specimen: Nasopharyngeal Swab  Result Value Ref Range Status   SARS Coronavirus 2 NEGATIVE NEGATIVE Final    Comment: (NOTE) SARS-CoV-2 target nucleic acids are NOT DETECTED.  The SARS-CoV-2 RNA is generally detectable in upper and lower respiratory specimens during the acute phase of infection. The lowest concentration of SARS-CoV-2 viral copies this assay can detect is 250 copies / mL. A negative result does not preclude SARS-CoV-2 infection and should not be used as the sole basis for treatment or other patient management decisions.  A negative result may occur with improper specimen collection / handling, submission of specimen other than nasopharyngeal swab, presence of viral mutation(s) within the areas targeted by this assay, and inadequate number of viral copies (<250 copies / mL). A negative result must be combined with clinical observations, patient history, and epidemiological information.  Fact Sheet for Patients:   BoilerBrush.com.cy  Fact Sheet for Healthcare Providers: https://pope.com/  This test is not yet approved or  cleared by the [place] FDA and has been authorized for detection and/or diagnosis of SARS-CoV-2 by FDA under an Emergency Use Authorization (EUA).  This EUA will remain in effect (meaning this test can be used) for the duration of the COVID-19 declaration under Section 564(b)(1) of the Act, 21 U.S.C. section 360bbb-3(b)(1), unless the authorization is terminated or revoked sooner.  Performed at [facility], [address], [place], [place] [address]   Blood Culture (routine x 2)     Status: None   Collection Time: 10/27/19  2:39 PM   Specimen: BLOOD RIGHT HAND  Result Value Ref Range Status   Specimen Description BLOOD RIGHT HAND  Final   Special Requests   Final      BOTTLES DRAWN AEROBIC AND ANAEROBIC Blood Culture adequate volume   Culture   Final    NO GROWTH 5 DAYS Performed at [facility], [address] [place], [place] [address]    Report Status 11/01/2019 FINAL  Final  MRSA PCR Screening     Status: None   Collection Time: 10/27/19  9:30 PM   Specimen: Nasopharyngeal  Result Value Ref Range Status  MRSA by PCR NEGATIVE NEGATIVE Final    Comment:        The GeneXpert MRSA Assay (FDA approved for NASAL specimens only), is one component of a comprehensive MRSA colonization surveillance program. It is not intended to diagnose MRSA infection nor to guide or monitor treatment for MRSA infections. Performed at [facility], [address] [place], [place] [address]   SARS Coronavirus 2 by RT PCR (hospital order, performed in [facility] hospital lab) Nasopharyngeal     Status: None   Collection Time: 10/27/19  9:32 PM   Specimen: Nasopharyngeal  Result Value Ref Range Status   SARS Coronavirus 2 NEGATIVE NEGATIVE Final    Comment: (NOTE) SARS-CoV-2 target nucleic acids are NOT DETECTED.  The SARS-CoV-2 RNA is generally detectable in upper and lower respiratory specimens during the acute phase of infection. The lowest concentration of SARS-CoV-2 viral copies this assay can detect is 250 copies / mL. A negative result does not preclude SARS-CoV-2 infection and should not be used as the sole basis for treatment or other patient management decisions.  A negative result may occur with improper specimen collection / handling, submission of specimen other than nasopharyngeal swab, presence of viral mutation(s) within the areas targeted by this assay, and inadequate number of viral copies (<250 copies / mL). A negative result must be combined with clinical observations, patient history, and epidemiological information.  Fact Sheet for Patients:   BoilerBrush.com.cy  Fact Sheet for Healthcare  Providers: https://pope.com/  This test is not yet approved or  cleared by the [place] FDA and has been authorized for detection and/or diagnosis of SARS-CoV-2 by FDA under an Emergency Use Authorization (EUA).  This EUA will remain in effect (meaning this test can be used) for the duration of the COVID-19 declaration under Section 564(b)(1) of the Act, 21 U.S.C. section 360bbb-3(b)(1), unless the authorization is terminated or revoked sooner.  Performed at [facility], [address], [place], [place] [address]   Culture, respiratory (non-expectorated)     Status: None   Collection Time: 10/28/19  1:56 PM   Specimen: Tracheal Aspirate; Respiratory  Result Value Ref Range Status   Specimen Description   Final    TRACHEAL ASPIRATE Performed at [facility], [address] [place], [place] [address]    Special Requests   Final    Normal Performed at [facility], [address] [place], [place] [address]    Gram Stain   Final    MODERATE WBC PRESENT, PREDOMINANTLY PMN RARE SQUAMOUS EPITHELIAL CELLS PRESENT ABUNDANT YEAST FEW GRAM POSITIVE COCCI    Culture   Final    RARE Normal respiratory flora-no Staph aureus or Pseudomonas seen Performed at [facility], [address], [place], [place] [address]    Report Status 10/31/2019 FINAL  Final  SARS Coronavirus 2 by RT PCR (hospital order, performed in [facility] hospital lab) Nasopharyngeal Nasopharyngeal Swab     Status: None   Collection Time: 10/29/19  9:29 AM   Specimen: Nasopharyngeal Swab  Result Value Ref Range Status   SARS Coronavirus 2 NEGATIVE NEGATIVE Final    Comment: (NOTE) SARS-CoV-2 target nucleic acids are NOT DETECTED.  The SARS-CoV-2 RNA is generally detectable in upper and lower respiratory specimens during the acute phase of infection. The lowest concentration of SARS-CoV-2 viral copies this assay can detect is 250 copies / mL. A negative result does not preclude  SARS-CoV-2 infection and should not be used as the sole basis for  treatment or other patient management decisions.  A negative result may occur with improper specimen collection / handling, submission of specimen other than nasopharyngeal swab, presence of viral mutation(s) within the areas targeted by this assay, and inadequate number of viral copies (<250 copies / mL). A negative result must be combined with clinical observations, patient history, and epidemiological information.  Fact Sheet for Patients:   BoilerBrush.com.cyhttps://www.fda.gov/media/136312/download  Fact Sheet for Healthcare Providers: https://pope.com/https://www.fda.gov/media/136313/download  This test is not yet approved or  cleared by the [place] FDA and has been authorized for detection and/or diagnosis of SARS-CoV-2 by FDA under an Emergency Use Authorization (EUA).  This EUA will remain in effect (meaning this test can be used) for the duration of the COVID-19 declaration under Section 564(b)(1) of the Act, 21 U.S.C. section 360bbb-3(b)(1), unless the authorization is terminated or revoked sooner.  Performed at [facility], [address] [place], [place] [address]        Today   Subjective    Diane Norton today has no new concerns, ambulating around without hypoxia or dyspnea on exertion no chest pain, appropriate and cooperative, eager to go home   Patient has been seen and examined prior to discharge   Objective   Blood pressure 129/74, pulse 69, temperature 99.3 F (37.4 C), temperature source Oral, resp. rate 16, height 5\' 8"  (1.727 m), weight 55.3 kg, last menstrual period 10/26/2019, SpO2 97 %.   Intake/Output Summary (Last 24 hours) at 11/05/2019 1226 Last data filed at 11/04/2019 1800 Gross per 24 hour  Intake 823.02 ml  Output --  Net 823.02 ml    Exam Gen:- Awake Alert, no acute distress  HEENT:- [place].AT, No sclera icterus Neck-Supple Neck,No JVD,.  Lungs-  CTAB , good air movement bilaterally  CV- S1,  S2 normal, regular Abd-  +ve B.Sounds, Abd Soft, No tenderness,    Extremity/Skin:- No  edema,   good pulses Psych-affect is appropriate, oriented x3 Neuro-no new focal deficits, no tremors    Data Review   CBC w Diff:  Lab Results  Component Value Date   WBC 10.0 11/04/2019   HGB 8.2 (L) 11/04/2019   HCT 27.2 (L) 11/04/2019   PLT 338 11/04/2019   LYMPHOPCT 5 10/27/2019   MONOPCT 2 10/27/2019   EOSPCT 1 10/27/2019   BASOPCT 0 10/27/2019    CMP:  Lab Results  Component Value Date   NA 141 11/04/2019   K 3.6 11/04/2019   CL 103 11/04/2019   CO2 26 11/04/2019   BUN 14 11/04/2019   CREATININE 0.46 11/04/2019   PROT 4.7 (L) 10/30/2019   ALBUMIN 2.4 (L) 10/30/2019   BILITOT 0.6 10/30/2019   ALKPHOS 92 10/30/2019   AST 34 10/30/2019   ALT 43 10/30/2019  .   Total Discharge time is about 33 minutes  Shon Haleourage Rhylen Pulido M.D on 11/05/2019 at 12:26 PM  Go to [email or web address] -  for contact info  [facility] - Office  [phone]

## 2019-11-05 NOTE — TOC Transition Note (Signed)
Transition of Care [facility]) - CM/SW Discharge Note   Patient Details  Name: Diane Norton MRN: [id number] Date of Birth: Sep 18, 1985  [facility] CM/SW Contact:  Leitha Bleak, RN Phone Number: 11/05/2019, 1:03 PM   Clinical Narrative:   Patient discharging home today. TOC completed SA consult. Resources given. TOC referred to [facility]. Patient has no insurance and no PCP. New PCP list also given. Patient states someone visited her and explained how to file for Medicaid. Patient plans to go to [employer] after discharge. Patient is going home with a friend. Her mother did not want her come home at this time. Pharmacy at the bedside explaining Eliquis coupon.     Final next level of care: Home/Self Care Barriers to Discharge: Barriers Resolved   Patient Goals and CMS Choice Patient states their goals for this hospitalization and ongoing recovery are:: to go home. CMS Medicare.gov Compare Post Acute Care list provided to:: Patient Choice offered to / list presented to : Patient  Discharge Placement         Name of family member notified: sister Patient and family notified of of transfer: 11/05/19  Discharge Plan and Services    Home

## 2019-11-05 NOTE — Progress Notes (Signed)
Patient discharged home today with family friend Shanda Bumps. Peripheral iv removed and picc removed as well. Patient tolerated both nursing skills well. Discharge teaching given and new medication education taught.

## 2019-11-05 NOTE — Progress Notes (Addendum)
Physical Therapy Treatment Patient Details Name: Diane Norton MRN: [id number] DOB: Dec 02, 1985 Today's Date: 11/05/2019    History of Present Illness Diane Norton is a 33 y.o. female with medical history significant for asthma, seizures, tobacco abuse.  Patient was brought to the ED via EMS for reports of increasing difficulty breathing over the past 4 days.  She also reports a dry cough.  Also reported possible fevers and chills.  She smokes 2 pack of cigarettes daily.She denies chest pain, no leg swelling, no recent trips.  Patient got Pfizer Covid vaccine 2 doses in March.  No known sick contacts.  No vomiting.  She reports some mild wheezing.  She has never required intubation for asthma before, or been this ill before.On EMS arrival, patient's O2 sats was 65%, she did not appear to be in any distress.    PT Comments    The patient performed supine to sit transfer modified independence and had dizziness sitting EOB which resolved in 30 seconds. The patient's O2 sat on RA was 85% at rest sitting EOB, which increased  To 92% on 2L  after 2 minutes. The patient performed sit to stand min guard transfer with mild unsteadiness corrected by patient upon standing. The patient requested to attempt ambulation without oxygen. O2 sat during ambulation was 85% on RA, and increased to 88% during seated therapeutic exercise. She was able to complete all exercises without SOB or dizziness. Her O2 increased to 92% RA 2 minutes rest EOS. Patient tolerated sitting up in chair after therapy- RN notified. PLAN: The patient will continue to benefit from skilled physical therapy services in hospital at recommended venue below in order to improve balance, gait, and ADL's to promote independence in functional activities.    Follow Up Recommendations  Supervision/Assistance - 24 hour     Equipment Recommendations  Rolling walker with 5" wheels    Recommendations for Other Services       Precautions /  Restrictions Precautions Precautions: Fall Restrictions Weight Bearing Restrictions: No    Mobility  Bed Mobility Overal bed mobility: Modified Independent Bed Mobility: Supine to Sit     Supine to sit: Modified independent (Device/Increase time)     General bed mobility comments: able to complete supine to sit on own but requires increased time; required mild assistance with donning socks while sitting EOB  Transfers Overall transfer level: Needs assistance Equipment used: Rolling walker (2 wheeled) Transfers: Sit to/from Stand Sit to Stand: Min guard         General transfer comment: unsteady on feet upon standing, pt able to restabilize standing balance  Ambulation/Gait Ambulation/Gait assistance: Min guard;Min assist Gait Distance (Feet): 100 Feet Assistive device: Rolling walker (2 wheeled) Gait Pattern/deviations: Step-to pattern;Ataxic;Decreased stride length     General Gait Details: increase in step length bilaterally, mild ataxia; pt required two rest breaks due to SOB   Stairs             Wheelchair Mobility    Modified Rankin (Stroke Patients Only)       Balance Overall balance assessment: Needs assistance Sitting-balance support: Bilateral upper extremity supported;Feet supported Sitting balance-Leahy Scale: Good Sitting balance - Comments: able to sit EOB w/out LOB, dizziness resolved within 30 seconds; pt required assistance in order to don socks but was able to complete >50% task independently   Standing balance support: Bilateral upper extremity supported;During functional activity Standing balance-Leahy Scale: Good Standing balance comment: dizziness upon standing resolved within 30 seconds  Cognition Arousal/Alertness: Awake/alert Behavior During Therapy: WFL for tasks assessed/performed Overall Cognitive Status: Within Functional Limits for tasks assessed                                         Exercises General Exercises - Lower Extremity Long Arc Quad: AROM;Strengthening;Seated;Both;10 reps Hip Flexion/Marching: AROM;Strengthening;Seated;Both;10 reps Toe Raises: AROM;Strengthening;Seated;Both;10 reps Heel Raises: AROM;Strengthening;Seated;Both;10 reps    General Comments        Pertinent Vitals/Pain Pain Assessment: No/denies pain    Home Living                      Prior Function            PT Goals (current goals can now be found in the care plan section) Acute Rehab PT Goals Patient Stated Goal: go home PT Goal Formulation: With patient Time For Goal Achievement: 11/17/19 Potential to Achieve Goals: Good Progress towards PT goals: Progressing toward goals    Frequency    Min 3X/week      PT Plan Discharge plan needs to be updated    Co-evaluation              AM-PAC PT "6 Clicks" Mobility   Outcome Measure  Help needed turning from your back to your side while in a flat bed without using bedrails?: None Help needed moving from lying on your back to sitting on the side of a flat bed without using bedrails?: None Help needed moving to and from a bed to a chair (including a wheelchair)?: A Little Help needed standing up from a chair using your arms (e.g., wheelchair or bedside chair)?: None Help needed to walk in hospital room?: A Little Help needed climbing 3-5 steps with a railing? : A Little 6 Click Score: 21    End of Session Equipment Utilized During Treatment: Gait belt;Oxygen Activity Tolerance: Patient limited by fatigue;Patient tolerated treatment well Patient left: in chair;with call bell/phone within reach Nurse Communication: Mobility status PT Visit Diagnosis: Unsteadiness on feet (R26.81);Other abnormalities of gait and mobility (R26.89);Muscle weakness (generalized) (M62.81);Ataxic gait (R26.0)     Time: 2637-8588 PT Time Calculation (min) (ACUTE ONLY): 27 min  Charges:  $Gait Training: 8-22  mins $Therapeutic Exercise: 8-22 mins                     2:15 PM , 11/05/19 Lorin Picket, SPT Physical Therapy with [place]  [facility] [phone] office  During this treatment session, the therapist was present, participating in and directing the treatment.  2:15 PM, 11/05/19 Ocie Bob, MPT Physical Therapist with [facility] 336 [phone] office [phone] mobile phone

## 2020-01-11 ENCOUNTER — Ambulatory Visit
Admission: EM | Admit: 2020-01-11 | Discharge: 2020-01-11 | Disposition: A | Discharge status: Home / Self Care | Payer: Self-pay | Attending: Emergency Medicine | Admitting: Emergency Medicine

## 2020-01-11 ENCOUNTER — Encounter: Payer: Self-pay | Admitting: Emergency Medicine

## 2020-01-11 ENCOUNTER — Other Ambulatory Visit: Payer: Self-pay

## 2020-01-11 DIAGNOSIS — R220 Localized swelling, mass and lump, head: Secondary | ICD-10-CM

## 2020-01-11 DIAGNOSIS — L0201 Cutaneous abscess of face: Secondary | ICD-10-CM

## 2020-01-11 MED ORDER — DEXAMETHASONE SODIUM PHOSPHATE 10 MG/ML IJ SOLN
10.0000 mg | Freq: Once | INTRAMUSCULAR | Status: AC
  Administered 2020-01-11: 10 mg via INTRAMUSCULAR

## 2020-01-11 MED ORDER — PREDNISONE 20 MG PO TABS: 20.0000 mg | ORAL_TABLET | Freq: Two times a day (BID) | ORAL | 0 refills | Status: AC

## 2020-01-11 MED ORDER — DOXYCYCLINE HYCLATE 100 MG PO CAPS: 100.0000 mg | ORAL_CAPSULE | Freq: Two times a day (BID) | ORAL | 0 refills | Status: DC

## 2020-01-11 NOTE — ED Provider Notes (Signed)
[facility] CARE CENTER   [id number] 01/11/20 Arrival Time: 1429   QI:[id number]  SUBJECTIVE:  Diane Norton is a 34 y.o. female who presents with a possible abscess of her LT cheek x 4 days.  Started off as a pimple and then has increased pain, redness, and swelling after picking at it.  Sore to the touch.  Has tried warm compress without relief.  Denies fever, chills, nausea, vomiting.  Does report fatigue.    ROS: As per HPI.  All other pertinent ROS negative.     Past Medical History:  Diagnosis Date  . Asthma   . Seizures (HCC)    Past Surgical History:  Procedure Laterality Date  . CHOLECYSTECTOMY    . FEMUR IM NAIL Right 01/09/2015   Procedure: INTRAMEDULLARY (IM) RETROGRADE FEMORAL NAILING; RIGHT;  Surgeon: Nadara Mustard, MD;  Location: [facility] OR;  Service: Orthopedics;  Laterality: Right;  . ORIF PATELLA Right 01/09/2015   Procedure: OPEN REDUCTION INTERNAL (ORIF) FIXATION PATELLA; RIGHT;  Surgeon: Nadara Mustard, MD;  Location: [facility] OR;  Service: Orthopedics;  Laterality: Right;   Allergies  Allergen Reactions  . Bactrim [Sulfamethoxazole-Trimethoprim] Nausea And Vomiting  . Ciprofloxacin Nausea And Vomiting  . Darvocet [Propoxyphene N-Acetaminophen] Hives  . Other     darvocet  . Penicillins Itching  . Penicillins Hives  . Bactrim [Sulfamethoxazole-Trimethoprim] Rash  . Ciprofloxacin Rash   No current facility-administered medications on file prior to encounter.   Current Outpatient Medications on File Prior to Encounter  Medication Sig Dispense Refill  . acetaminophen (TYLENOL) 325 MG tablet Take 2 tablets (650 mg total) by mouth every 6 (six) hours as needed for mild pain (or Fever >/= 101). 12 tablet 0  . albuterol (VENTOLIN HFA) 108 (90 Base) MCG/ACT inhaler Inhale 2 puffs into the lungs every 6 (six) hours as needed for wheezing or shortness of breath. 1 each 2  . apixaban (ELIQUIS) 5 MG TABS tablet Take 1 tablet (5 mg total) by mouth 2 (two) times daily. Start on  October 1st  after you finish the initial starter pack 60 tablet 4  . Apixaban Starter Pack, 10mg  and 5mg , (ELIQUIS DVT/PE STARTER PACK) Take as directed on package: start with two-5mg  tablets twice daily for 7 days. On day 8, switch to one-5mg  tablet twice daily. 1 each 0  . guaiFENesin (MUCINEX) 600 MG 12 hr tablet Take 1 tablet (600 mg total) by mouth 2 (two) times daily. 20 tablet 0  . Multiple Vitamin (MULTIVITAMIN WITH MINERALS) TABS tablet Take 1 tablet by mouth daily. 30 tablet 2  . omeprazole (PRILOSEC) 20 MG capsule Take 1 capsule (20 mg total) by mouth daily. 30 capsule 1  . traZODone (DESYREL) 150 MG tablet Take 1 tablet (150 mg total) by mouth at bedtime. 30 tablet 3   Social History   Socioeconomic History  . Marital status: Divorced    Spouse name: Not on file  . Number of children: Not on file  . Years of education: Not on file  . Highest education level: Not on file  Occupational History  . Not on file  Tobacco Use  . Smoking status: Current Every Day Smoker    Packs/day: 1.00    Types: Cigarettes  . Smokeless tobacco: Never Used  Vaping Use  . Vaping Use: Never used  Substance and Sexual Activity  . Alcohol use: No  . Drug use: No  . Sexual activity: Yes    Birth control/protection: None  Other Topics Concern  .  Not on file  Social History Narrative   ** Merged History Encounter **       Social Determinants of Health   Financial Resource Strain:   . Difficulty of Paying Living Expenses: Not on file  Food Insecurity:   . Worried About Programme researcher, broadcasting/film/video in the Last Year: Not on file  . Ran Out of Food in the Last Year: Not on file  Transportation Needs:   . Lack of Transportation (Medical): Not on file  . Lack of Transportation (Non-Medical): Not on file  Physical Activity:   . Days of Exercise per Week: Not on file  . Minutes of Exercise per Session: Not on file  Stress:   . Feeling of Stress : Not on file  Social Connections:   . Frequency of  Communication with Friends and Family: Not on file  . Frequency of Social Gatherings with Friends and Family: Not on file  . Attends Religious Services: Not on file  . Active Member of Clubs or Organizations: Not on file  . Attends Banker Meetings: Not on file  . Marital Status: Not on file  Intimate Partner Violence:   . Fear of Current or Ex-Partner: Not on file  . Emotionally Abused: Not on file  . Physically Abused: Not on file  . Sexually Abused: Not on file   No family history on file.  OBJECTIVE:  Vitals:   01/11/20 1518 01/11/20 1528  BP: 111/65   Pulse: 87   Resp: 18   Temp: 97.7 F (36.5 C)   TempSrc: Oral   SpO2: 94%   Weight:  130 lb (59 kg)  Height:  5\' 4"  (1.626 m)     General appearance: alert; no distress HENT: NCAT; EOMI grossly; oropharynx clear, tolerating own secretions without difficulty Skin: 2 cm induration of her LT cheek with overlying erythema, small superficial wound to LT lower cheek with clear drainage; tender to touch; no bleeding Psychological: alert and cooperative; normal mood and affect  ASSESSMENT & PLAN:  1. Facial abscess   2. Facial swelling     Meds ordered this encounter  Medications  . predniSONE (DELTASONE) 20 MG tablet    Sig: Take 1 tablet (20 mg total) by mouth 2 (two) times daily with a meal for 5 days.    Dispense:  10 tablet    Refill:  0    Order Specific Question:   Supervising Provider    Answer:   Eustace Moore  . doxycycline (VIBRAMYCIN) 100 MG capsule    Sig: Take 1 capsule (100 mg total) by mouth 2 (two) times daily.    Dispense:  20 capsule    Refill:  0    Order Specific Question:   Supervising Provider    Answer:   [[id number]] Eustace Moore  . dexamethasone (DECADRON) injection 10 mg   Steroid shot given in office for facial swelling Apply warm compresses 3-4x daily for 10-15 minutes Wash site daily with warm water and mild soap Keep covered to avoid friction Take  antibiotic as prescribed and to completion Prednisone prescribed.  Take as directed and to completion  Follow up here or with PCP if symptoms persists Return or go to the ED if you have any new or worsening symptoms increased redness, swelling, pain, nausea, vomiting, fever, chills, symptoms do not improve with medications, trouble breathing, difficulty swallowing, throat swelling, drooling, etc...    Reviewed expectations re: course of current medical issues. Questions answered.  Outlined signs and symptoms indicating need for more acute intervention. Patient verbalized understanding. After Visit Summary given.          Rennis Harding, PA-C 01/11/20 1549

## 2020-01-11 NOTE — Discharge Instructions (Signed)
Steroid shot given in office for facial swelling Apply warm compresses 3-4x daily for 10-15 minutes Wash site daily with warm water and mild soap Keep covered to avoid friction Take antibiotic as prescribed and to completion Prednisone prescribed.  Take as directed and to completion  Follow up here or with PCP if symptoms persists Return or go to the ED if you have any new or worsening symptoms increased redness, swelling, pain, nausea, vomiting, fever, chills, symptoms do not improve with medications, trouble breathing, difficulty swallowing, throat swelling, drooling, etc..Marland Kitchen

## 2020-01-11 NOTE — ED Triage Notes (Signed)
Pt has area to LT cheek that is red, swollen and painful since Thursday.  Pt thought it was a pimple and was picking at it.

## 2023-01-21 ENCOUNTER — Encounter (HOSPITAL_COMMUNITY): Payer: Self-pay | Admitting: Emergency Medicine

## 2023-01-21 ENCOUNTER — Emergency Department (HOSPITAL_COMMUNITY): Payer: Medicaid Other

## 2023-01-21 ENCOUNTER — Inpatient Hospital Stay (HOSPITAL_COMMUNITY)
Admission: EM | Admit: 2023-01-21 | Discharge: 2023-01-24 | DRG: 177 | Discharge status: Against Medical Advice | Payer: Medicaid Other | Attending: Family Medicine | Admitting: Family Medicine

## 2023-01-21 ENCOUNTER — Other Ambulatory Visit: Payer: Self-pay

## 2023-01-21 DIAGNOSIS — Z9049 Acquired absence of other specified parts of digestive tract: Secondary | ICD-10-CM

## 2023-01-21 DIAGNOSIS — Z886 Allergy status to analgesic agent status: Secondary | ICD-10-CM

## 2023-01-21 DIAGNOSIS — D649 Anemia, unspecified: Secondary | ICD-10-CM | POA: Diagnosis present

## 2023-01-21 DIAGNOSIS — Z9889 Other specified postprocedural states: Secondary | ICD-10-CM

## 2023-01-21 DIAGNOSIS — J438 Other emphysema: Secondary | ICD-10-CM | POA: Diagnosis present

## 2023-01-21 DIAGNOSIS — J189 Pneumonia, unspecified organism: Principal | ICD-10-CM | POA: Diagnosis present

## 2023-01-21 DIAGNOSIS — Z880 Allergy status to penicillin: Secondary | ICD-10-CM

## 2023-01-21 DIAGNOSIS — J9601 Acute respiratory failure with hypoxia: Secondary | ICD-10-CM | POA: Diagnosis present

## 2023-01-21 DIAGNOSIS — F1123 Opioid dependence with withdrawal: Secondary | ICD-10-CM | POA: Diagnosis present

## 2023-01-21 DIAGNOSIS — J432 Centrilobular emphysema: Secondary | ICD-10-CM | POA: Diagnosis present

## 2023-01-21 DIAGNOSIS — Z7901 Long term (current) use of anticoagulants: Secondary | ICD-10-CM

## 2023-01-21 DIAGNOSIS — Z1152 Encounter for screening for COVID-19: Secondary | ICD-10-CM

## 2023-01-21 DIAGNOSIS — R7989 Other specified abnormal findings of blood chemistry: Secondary | ICD-10-CM | POA: Insufficient documentation

## 2023-01-21 DIAGNOSIS — I2489 Other forms of acute ischemic heart disease: Secondary | ICD-10-CM | POA: Diagnosis present

## 2023-01-21 DIAGNOSIS — F17210 Nicotine dependence, cigarettes, uncomplicated: Secondary | ICD-10-CM | POA: Diagnosis present

## 2023-01-21 DIAGNOSIS — J15212 Pneumonia due to Methicillin resistant Staphylococcus aureus: Principal | ICD-10-CM | POA: Diagnosis present

## 2023-01-21 DIAGNOSIS — Z79899 Other long term (current) drug therapy: Secondary | ICD-10-CM

## 2023-01-21 DIAGNOSIS — Z881 Allergy status to other antibiotic agents status: Secondary | ICD-10-CM

## 2023-01-21 DIAGNOSIS — K219 Gastro-esophageal reflux disease without esophagitis: Secondary | ICD-10-CM | POA: Diagnosis present

## 2023-01-21 DIAGNOSIS — I10 Essential (primary) hypertension: Secondary | ICD-10-CM | POA: Diagnosis present

## 2023-01-21 DIAGNOSIS — Z86711 Personal history of pulmonary embolism: Secondary | ICD-10-CM

## 2023-01-21 DIAGNOSIS — Z5329 Procedure and treatment not carried out because of patient's decision for other reasons: Secondary | ICD-10-CM | POA: Diagnosis not present

## 2023-01-21 DIAGNOSIS — Z8249 Family history of ischemic heart disease and other diseases of the circulatory system: Secondary | ICD-10-CM

## 2023-01-21 DIAGNOSIS — Z885 Allergy status to narcotic agent status: Secondary | ICD-10-CM

## 2023-01-21 DIAGNOSIS — F1193 Opioid use, unspecified with withdrawal: Secondary | ICD-10-CM | POA: Insufficient documentation

## 2023-01-21 DIAGNOSIS — J45909 Unspecified asthma, uncomplicated: Secondary | ICD-10-CM | POA: Diagnosis present

## 2023-01-21 DIAGNOSIS — R9431 Abnormal electrocardiogram [ECG] [EKG]: Secondary | ICD-10-CM | POA: Insufficient documentation

## 2023-01-21 DIAGNOSIS — R569 Unspecified convulsions: Secondary | ICD-10-CM | POA: Diagnosis present

## 2023-01-21 LAB — CBC WITH DIFFERENTIAL/PLATELET
Abs Immature Granulocytes: 0.03 10*3/uL (ref 0.00–0.07)
Basophils Absolute: 0.1 10*3/uL (ref 0.0–0.1)
Basophils Relative: 1 %
Eosinophils Absolute: 0.1 10*3/uL (ref 0.0–0.5)
Eosinophils Relative: 1 %
HCT: 48.8 % — ABNORMAL HIGH (ref 36.0–46.0)
Hemoglobin: 16.7 g/dL — ABNORMAL HIGH (ref 12.0–15.0)
Immature Granulocytes: 0 %
Lymphocytes Relative: 10 %
Lymphs Abs: 1 10*3/uL (ref 0.7–4.0)
MCH: 30.8 pg (ref 26.0–34.0)
MCHC: 34.2 g/dL (ref 30.0–36.0)
MCV: 90 fL (ref 80.0–100.0)
Monocytes Absolute: 1 10*3/uL (ref 0.1–1.0)
Monocytes Relative: 10 %
Neutro Abs: 7.5 10*3/uL (ref 1.7–7.7)
Neutrophils Relative %: 78 %
Platelets: 270 10*3/uL (ref 150–400)
RBC: 5.42 MIL/uL — ABNORMAL HIGH (ref 3.87–5.11)
RDW: 13 % (ref 11.5–15.5)
WBC: 9.6 10*3/uL (ref 4.0–10.5)
nRBC: 0 % (ref 0.0–0.2)

## 2023-01-21 LAB — COMPREHENSIVE METABOLIC PANEL
ALT: 14 U/L (ref 0–44)
AST: 15 U/L (ref 15–41)
Albumin: 4.1 g/dL (ref 3.5–5.0)
Alkaline Phosphatase: 90 U/L (ref 38–126)
Anion gap: 11 (ref 5–15)
BUN: 6 mg/dL (ref 6–20)
CO2: 20 mmol/L — ABNORMAL LOW (ref 22–32)
Calcium: 8.6 mg/dL — ABNORMAL LOW (ref 8.9–10.3)
Chloride: 105 mmol/L (ref 98–111)
Creatinine, Ser: 0.51 mg/dL (ref 0.44–1.00)
GFR, Estimated: >60 mL/min (ref >60)
Glucose, Bld: 102 mg/dL — ABNORMAL HIGH (ref 70–99)
Potassium: 3.9 mmol/L (ref 3.5–5.1)
Sodium: 136 mmol/L (ref 135–145)
Total Bilirubin: 0.4 mg/dL (ref <1.2)
Total Protein: 8.1 g/dL (ref 6.5–8.1)

## 2023-01-21 LAB — TROPONIN I (HIGH SENSITIVITY)
Troponin I (High Sensitivity): 73 ng/L — ABNORMAL HIGH (ref <18)
Troponin I (High Sensitivity): 161 ng/L (ref <18)

## 2023-01-21 LAB — PROTIME-INR
INR: 1.1 (ref 0.8–1.2)
Prothrombin Time: 14 s (ref 11.4–15.2)

## 2023-01-21 LAB — HCG, QUANTITATIVE, PREGNANCY: hCG, Beta Chain, Quant, S: <1 m[IU]/mL (ref <5)

## 2023-01-21 LAB — RESP PANEL BY RT-PCR (RSV, FLU A&B, COVID)  RVPGX2
Influenza A by PCR: NEGATIVE
Influenza B by PCR: NEGATIVE
Resp Syncytial Virus by PCR: NEGATIVE
SARS Coronavirus 2 by RT PCR: NEGATIVE

## 2023-01-21 LAB — APTT: aPTT: 39 s — ABNORMAL HIGH (ref 24–36)

## 2023-01-21 MED ORDER — HEPARIN BOLUS VIA INFUSION
4000.0000 [IU] | Freq: Once | INTRAVENOUS | Status: AC
  Administered 2023-01-21: 4000 [IU] via INTRAVENOUS

## 2023-01-21 MED ORDER — IPRATROPIUM-ALBUTEROL 0.5-2.5 (3) MG/3ML IN SOLN
RESPIRATORY_TRACT | Status: AC
  Filled 2023-01-21: qty 3

## 2023-01-21 MED ORDER — IPRATROPIUM-ALBUTEROL 0.5-2.5 (3) MG/3ML IN SOLN
3.0000 mL | Freq: Once | RESPIRATORY_TRACT | Status: AC
  Administered 2023-01-21: 3 mL via RESPIRATORY_TRACT
  Filled 2023-01-21: qty 3

## 2023-01-21 MED ORDER — IOHEXOL 350 MG/ML SOLN
75.0000 mL | Freq: Once | INTRAVENOUS | Status: AC | PRN
  Administered 2023-01-21: 75 mL via INTRAVENOUS

## 2023-01-21 MED ORDER — IPRATROPIUM-ALBUTEROL 0.5-2.5 (3) MG/3ML IN SOLN
3.0000 mL | Freq: Once | RESPIRATORY_TRACT | Status: AC
  Administered 2023-01-21: 3 mL via RESPIRATORY_TRACT

## 2023-01-21 MED ORDER — DIPHENHYDRAMINE HCL 50 MG/ML IJ SOLN
25.0000 mg | Freq: Once | INTRAMUSCULAR | Status: AC
  Administered 2023-01-21: 25 mg via INTRAVENOUS
  Filled 2023-01-21: qty 1

## 2023-01-21 MED ORDER — ALBUTEROL SULFATE (2.5 MG/3ML) 0.083% IN NEBU
INHALATION_SOLUTION | RESPIRATORY_TRACT | Status: AC
  Filled 2023-01-21: qty 3

## 2023-01-21 MED ORDER — ONDANSETRON HCL 4 MG/2ML IJ SOLN
4.0000 mg | Freq: Once | INTRAMUSCULAR | Status: AC
  Administered 2023-01-21: 4 mg via INTRAVENOUS
  Filled 2023-01-21: qty 2

## 2023-01-21 MED ORDER — ALBUTEROL SULFATE (2.5 MG/3ML) 0.083% IN NEBU
2.5000 mg | INHALATION_SOLUTION | Freq: Once | RESPIRATORY_TRACT | Status: AC
  Administered 2023-01-21: 2.5 mg via RESPIRATORY_TRACT

## 2023-01-21 MED ORDER — BUPRENORPHINE HCL-NALOXONE HCL 8-2 MG SL SUBL
1.0000 | SUBLINGUAL_TABLET | Freq: Once | SUBLINGUAL | Status: AC
  Administered 2023-01-21: 1 via SUBLINGUAL
  Filled 2023-01-21: qty 1

## 2023-01-21 MED ORDER — HEPARIN (PORCINE) 25000 UT/250ML-% IV SOLN
1300.0000 [IU]/h | INTRAVENOUS | Status: DC
  Administered 2023-01-21: 1100 [IU]/h via INTRAVENOUS
  Filled 2023-01-21: qty 250

## 2023-01-21 NOTE — Progress Notes (Signed)
PHARMACY - ANTICOAGULATION CONSULT NOTE  Pharmacy Consult for heparin infusion Indication: concern for PE, elevated troponin  Allergies  Allergen Reactions   Bactrim [Sulfamethoxazole-Trimethoprim] Nausea And Vomiting   Ciprofloxacin Nausea And Vomiting   Darvocet [Propoxyphene N-Acetaminophen] Hives   Other     darvocet   Penicillins Itching   Penicillins Hives   Bactrim [Sulfamethoxazole-Trimethoprim] Rash   Ciprofloxacin Rash    Patient Measurements: Height: 5\' 4"  (162.6 cm) Weight: 60 kg (132 lb 4.4 oz) IBW/kg (Calculated) : 54.7 Heparin Dosing Weight: 60 kg  Vital Signs: Temp: 98.2 F (36.8 C) (11/18 1811) Temp Source: Axillary (11/18 1811) BP: 134/73 (11/18 1811) Pulse Rate: 73 (11/18 1811)  Labs: Recent Labs    01/21/23 1830 01/21/23 2012  HGB 16.7*  --   HCT 48.8*  --   PLT 270  --   TROPONINIHS  --  161*    CrCl cannot be calculated (Patient's most recent lab result is older than the maximum 21 days allowed.).   Medical History: Past Medical History:  Diagnosis Date   Asthma    Seizures (HCC)     Assessment:  37 y.o. female, history of PE, multifocal pneumonia, history of tobacco use, smokes 1 pack/day, who presents to the ED secondary to severe shortness of breath that came on acutely. A review of dispense history reveals no chronic anticoagulation prior to arrival and RN confirms no recent use  Goal of Therapy:  Heparin level 0.3-0.7 units/ml Monitor platelets by anticoagulation protocol: Yes   Plan:  ---Give 4000 units bolus x 1 ---Start heparin infusion at 1100 units/hr ---Check anti-Xa level and aPTT level in 6 hours and daily while on heparin ---Continue to monitor H&H and platelets  Lowella Bandy 01/21/2023,9:11 PM

## 2023-01-21 NOTE — ED Notes (Signed)
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 Provider notified

## 2023-01-21 NOTE — ED Notes (Signed)
Patient request removing NRB and reports her breathing feels better. Patient placed on 2L via [place]

## 2023-01-21 NOTE — ED Notes (Signed)
Called into patient's room for reports of SOB and patient requesting NRB to be placed back on due to difficulty breathing. O2 increased to 4L of O2 via [place]. Spoke to provider in regards to plan for patient, states to try patient on high flow oxygen. Respiratory notified

## 2023-01-21 NOTE — ED Provider Notes (Incomplete)
[place] EMERGENCY DEPARTMENT AT [facility] Provider Note   CSN: [id number] Arrival date & time: 01/21/23  1756     History {Add pertinent medical, surgical, social history, OB history to HPI:1} Chief Complaint  Patient presents with   Shortness of Breath    Diane Norton is a 37 y.o. female, history of PE, multifocal pneumonia, history of tobacco use, smokes 1 pack/day, who presents to the ED secondary to severe shortness of breath that came on acutely yesterday with associated mid chest pain.  States that she feels like there is pressure on her chest, and it hurts to take a deep breath.  She has a history of PE, is not on any blood thinners.  Notes that she has had URI symptoms, the last few days, and has had some nasal congestion, sore throat, and a productive cough.  Has been smoking less because she has not felt good.  Reports that she may have had some fevers, but is unsure.  Does not wear any oxygen at home.    Home Medications Prior to Admission medications   Medication Sig Start Date End Date Taking? Authorizing Provider  acetaminophen (TYLENOL) 325 MG tablet Take 2 tablets (650 mg total) by mouth every 6 (six) hours as needed for mild pain (or Fever >/= 101). 11/05/19   Emokpae, Courage, MD  albuterol (VENTOLIN HFA) 108 (90 Base) MCG/ACT inhaler Inhale 2 puffs into the lungs every 6 (six) hours as needed for wheezing or shortness of breath. 11/05/19   Shon Hale, MD  apixaban (ELIQUIS) 5 MG TABS tablet Take 1 tablet (5 mg total) by mouth 2 (two) times daily. Start on October 1st  after you finish the initial starter pack 12/04/19   Shon Hale, MD  Apixaban Starter Pack, 10mg  and 5mg , (ELIQUIS DVT/PE STARTER PACK) Take as directed on package: start with two-5mg  tablets twice daily for 7 days. On day 8, switch to one-5mg  tablet twice daily. 11/05/19   Shon Hale, MD  doxycycline (VIBRAMYCIN) 100 MG capsule Take 1 capsule (100 mg total) by mouth 2 (two)  times daily. 01/11/20   Wurst, [place], PA-C  guaiFENesin (MUCINEX) 600 MG 12 hr tablet Take 1 tablet (600 mg total) by mouth 2 (two) times daily. 11/05/19   Shon Hale, MD  Multiple Vitamin (MULTIVITAMIN WITH MINERALS) TABS tablet Take 1 tablet by mouth daily. 11/06/19   Shon Hale, MD  omeprazole (PRILOSEC) 20 MG capsule Take 1 capsule (20 mg total) by mouth daily. 11/05/19 11/04/20  Shon Hale, MD  traZODone (DESYREL) 150 MG tablet Take 1 tablet (150 mg total) by mouth at bedtime. 11/05/19   Shon Hale, MD      Allergies    Bactrim [sulfamethoxazole-trimethoprim], Ciprofloxacin, Darvocet [propoxyphene n-acetaminophen], Other, Penicillins, Penicillins, Bactrim [sulfamethoxazole-trimethoprim], and Ciprofloxacin    Review of Systems   Review of Systems  Constitutional:  Positive for fever.  Respiratory:  Positive for cough and shortness of breath.   Cardiovascular:  Positive for chest pain. Negative for leg swelling.    Physical Exam Updated Vital Signs Ht 5\' 4"  (1.626 m)   Wt 60 kg   LMP 01/01/2023   BMI 22.71 kg/m  Physical Exam Vitals and nursing note reviewed.  Constitutional:      General: She is not in acute distress.    Appearance: She is well-developed.  HENT:     Head: Normocephalic and atraumatic.  Eyes:     Conjunctiva/sclera: Conjunctivae normal.  Cardiovascular:     Rate and  Rhythm: Normal rate and regular rhythm.     Heart sounds: No murmur heard. Pulmonary:     Effort: Pulmonary effort is normal. No respiratory distress.     Breath sounds: Examination of the right-upper field reveals decreased breath sounds. Examination of the left-upper field reveals decreased breath sounds. Decreased breath sounds present.     Comments: +crackles of bilateral upper lobes, on NRB (was 93%% on RA, NRB added for comfort per EMS) Abdominal:     Palpations: Abdomen is soft.     Tenderness: There is no abdominal tenderness.  Musculoskeletal:        General: No  swelling.     Cervical back: Neck supple.  Skin:    General: Skin is warm and dry.     Capillary Refill: Capillary refill takes less than 2 seconds.  Neurological:     Mental Status: She is alert.  Psychiatric:        Mood and Affect: Mood normal.     ED Results / Procedures / Treatments   Labs (all labs ordered are listed, but only abnormal results are displayed) Labs Reviewed  RESP PANEL BY RT-PCR (RSV, FLU A&B, COVID)  [id number]  HCG, QUANTITATIVE, PREGNANCY  CBC WITH DIFFERENTIAL/PLATELET  COMPREHENSIVE METABOLIC PANEL  TROPONIN I (HIGH SENSITIVITY)    EKG None  Radiology No results found.  Procedures Procedures  {Document cardiac monitor, telemetry assessment procedure when appropriate:1}  Medications Ordered in ED Medications  ipratropium-albuterol (DUONEB) 0.5-2.5 (3) MG/3ML nebulizer solution 3 mL (has no administration in time range)    ED Course/ Medical Decision Making/ A&P   {   Click here for ABCD2, HEART and other calculatorsREFRESH Note before signing :1}                              Medical Decision Making Amount and/or Complexity of Data Reviewed Labs: ordered. Radiology: ordered.  Risk Prescription drug management.   ***  {Document critical care time when appropriate:1} {Document review of labs and clinical decision tools ie heart score, Chads2Vasc2 etc:1}  {Document your independent review of radiology images, and any outside records:1} {Document your discussion with family members, caretakers, and with consultants:1} {Document social determinants of health affecting pt's care:1} {Document your decision making why or why not admission, treatments were needed:1} Final Clinical Impression(s) / ED Diagnoses Final diagnoses:  None    Rx / DC Orders ED Discharge Orders     None

## 2023-01-21 NOTE — ED Notes (Signed)
Patient transported to CT 

## 2023-01-21 NOTE — ED Triage Notes (Signed)
Pt c/o sob that started yesterday. O2 was 93 on room air , pt brought in by ems on NRB

## 2023-01-22 ENCOUNTER — Other Ambulatory Visit (HOSPITAL_COMMUNITY): Payer: Self-pay | Admitting: *Deleted

## 2023-01-22 ENCOUNTER — Inpatient Hospital Stay (HOSPITAL_COMMUNITY): Payer: Medicaid Other

## 2023-01-22 DIAGNOSIS — K219 Gastro-esophageal reflux disease without esophagitis: Secondary | ICD-10-CM | POA: Diagnosis present

## 2023-01-22 DIAGNOSIS — J45909 Unspecified asthma, uncomplicated: Secondary | ICD-10-CM | POA: Diagnosis present

## 2023-01-22 DIAGNOSIS — J15212 Pneumonia due to Methicillin resistant Staphylococcus aureus: Secondary | ICD-10-CM | POA: Diagnosis present

## 2023-01-22 DIAGNOSIS — Z86711 Personal history of pulmonary embolism: Secondary | ICD-10-CM | POA: Diagnosis not present

## 2023-01-22 DIAGNOSIS — Z9889 Other specified postprocedural states: Secondary | ICD-10-CM | POA: Diagnosis not present

## 2023-01-22 DIAGNOSIS — Z79899 Other long term (current) drug therapy: Secondary | ICD-10-CM | POA: Diagnosis not present

## 2023-01-22 DIAGNOSIS — Z9049 Acquired absence of other specified parts of digestive tract: Secondary | ICD-10-CM | POA: Diagnosis not present

## 2023-01-22 DIAGNOSIS — J189 Pneumonia, unspecified organism: Secondary | ICD-10-CM | POA: Diagnosis not present

## 2023-01-22 DIAGNOSIS — R7989 Other specified abnormal findings of blood chemistry: Secondary | ICD-10-CM | POA: Insufficient documentation

## 2023-01-22 DIAGNOSIS — F17210 Nicotine dependence, cigarettes, uncomplicated: Secondary | ICD-10-CM | POA: Diagnosis present

## 2023-01-22 DIAGNOSIS — I10 Essential (primary) hypertension: Secondary | ICD-10-CM

## 2023-01-22 DIAGNOSIS — R9431 Abnormal electrocardiogram [ECG] [EKG]: Secondary | ICD-10-CM | POA: Insufficient documentation

## 2023-01-22 DIAGNOSIS — I5A Non-ischemic myocardial injury (non-traumatic): Secondary | ICD-10-CM

## 2023-01-22 DIAGNOSIS — F1193 Opioid use, unspecified with withdrawal: Secondary | ICD-10-CM | POA: Insufficient documentation

## 2023-01-22 DIAGNOSIS — J432 Centrilobular emphysema: Secondary | ICD-10-CM | POA: Diagnosis present

## 2023-01-22 DIAGNOSIS — I2489 Other forms of acute ischemic heart disease: Secondary | ICD-10-CM | POA: Diagnosis present

## 2023-01-22 DIAGNOSIS — I214 Non-ST elevation (NSTEMI) myocardial infarction: Secondary | ICD-10-CM

## 2023-01-22 DIAGNOSIS — D649 Anemia, unspecified: Secondary | ICD-10-CM | POA: Diagnosis present

## 2023-01-22 DIAGNOSIS — Z7901 Long term (current) use of anticoagulants: Secondary | ICD-10-CM | POA: Diagnosis not present

## 2023-01-22 DIAGNOSIS — Z1152 Encounter for screening for COVID-19: Secondary | ICD-10-CM | POA: Diagnosis not present

## 2023-01-22 DIAGNOSIS — J438 Other emphysema: Secondary | ICD-10-CM | POA: Diagnosis present

## 2023-01-22 DIAGNOSIS — J441 Chronic obstructive pulmonary disease with (acute) exacerbation: Secondary | ICD-10-CM

## 2023-01-22 DIAGNOSIS — Z8249 Family history of ischemic heart disease and other diseases of the circulatory system: Secondary | ICD-10-CM | POA: Diagnosis not present

## 2023-01-22 DIAGNOSIS — J9601 Acute respiratory failure with hypoxia: Secondary | ICD-10-CM | POA: Diagnosis present

## 2023-01-22 DIAGNOSIS — R569 Unspecified convulsions: Secondary | ICD-10-CM | POA: Diagnosis present

## 2023-01-22 DIAGNOSIS — Z885 Allergy status to narcotic agent status: Secondary | ICD-10-CM | POA: Diagnosis not present

## 2023-01-22 DIAGNOSIS — Z881 Allergy status to other antibiotic agents status: Secondary | ICD-10-CM | POA: Diagnosis not present

## 2023-01-22 DIAGNOSIS — Z880 Allergy status to penicillin: Secondary | ICD-10-CM | POA: Diagnosis not present

## 2023-01-22 DIAGNOSIS — Z886 Allergy status to analgesic agent status: Secondary | ICD-10-CM | POA: Diagnosis not present

## 2023-01-22 DIAGNOSIS — F1123 Opioid dependence with withdrawal: Secondary | ICD-10-CM | POA: Diagnosis present

## 2023-01-22 LAB — CBC
HCT: 46.3 % — ABNORMAL HIGH (ref 36.0–46.0)
Hemoglobin: 16 g/dL — ABNORMAL HIGH (ref 12.0–15.0)
MCH: 30.9 pg (ref 26.0–34.0)
MCHC: 34.6 g/dL (ref 30.0–36.0)
MCV: 89.6 fL (ref 80.0–100.0)
Platelets: 270 10*3/uL (ref 150–400)
RBC: 5.17 MIL/uL — ABNORMAL HIGH (ref 3.87–5.11)
RDW: 12.9 % (ref 11.5–15.5)
WBC: 8.6 10*3/uL (ref 4.0–10.5)
nRBC: 0 % (ref 0.0–0.2)

## 2023-01-22 LAB — COMPREHENSIVE METABOLIC PANEL
ALT: 14 U/L (ref 0–44)
AST: 16 U/L (ref 15–41)
Albumin: 3.8 g/dL (ref 3.5–5.0)
Alkaline Phosphatase: 84 U/L (ref 38–126)
Anion gap: 14 (ref 5–15)
BUN: 8 mg/dL (ref 6–20)
CO2: 22 mmol/L (ref 22–32)
Calcium: 9 mg/dL (ref 8.9–10.3)
Chloride: 100 mmol/L (ref 98–111)
Creatinine, Ser: 0.55 mg/dL (ref 0.44–1.00)
GFR, Estimated: >60 mL/min (ref >60)
Glucose, Bld: 133 mg/dL — ABNORMAL HIGH (ref 70–99)
Potassium: 3.4 mmol/L — ABNORMAL LOW (ref 3.5–5.1)
Sodium: 136 mmol/L (ref 135–145)
Total Bilirubin: 0.4 mg/dL (ref <1.2)
Total Protein: 7.7 g/dL (ref 6.5–8.1)

## 2023-01-22 LAB — ECHOCARDIOGRAM COMPLETE
Area-P 1/2: 3.99 cm2
Height: 64 in
S' Lateral: 3.5 cm
Weight: 2673.74 [oz_av]

## 2023-01-22 LAB — LIPID PANEL
Cholesterol: 171 mg/dL (ref 0–200)
HDL: 47 mg/dL (ref >40)
LDL Cholesterol: 112 mg/dL — ABNORMAL HIGH (ref 0–99)
Total CHOL/HDL Ratio: 3.6 {ratio}
Triglycerides: 61 mg/dL (ref <150)
VLDL: 12 mg/dL (ref 0–40)

## 2023-01-22 LAB — TROPONIN I (HIGH SENSITIVITY)
Troponin I (High Sensitivity): 212 ng/L (ref <18)
Troponin I (High Sensitivity): 265 ng/L (ref <18)

## 2023-01-22 LAB — MRSA NEXT GEN BY PCR, NASAL: MRSA by PCR Next Gen: DETECTED — AB

## 2023-01-22 LAB — MAGNESIUM: Magnesium: 1.9 mg/dL (ref 1.7–2.4)

## 2023-01-22 LAB — PHOSPHORUS: Phosphorus: 2.6 mg/dL (ref 2.5–4.6)

## 2023-01-22 LAB — HEPARIN LEVEL (UNFRACTIONATED): Heparin Unfractionated: 0.19 [IU]/mL — ABNORMAL LOW (ref 0.30–0.70)

## 2023-01-22 LAB — HIV ANTIBODY (ROUTINE TESTING W REFLEX): HIV Screen 4th Generation wRfx: NONREACTIVE

## 2023-01-22 MED ORDER — METOPROLOL TARTRATE 25 MG PO TABS: 12.5000 mg | ORAL_TABLET | Freq: Two times a day (BID) | ORAL | Status: DC

## 2023-01-22 MED ORDER — MUPIROCIN 2 % EX OINT
TOPICAL_OINTMENT | Freq: Two times a day (BID) | CUTANEOUS | Status: DC
  Administered 2023-01-23: 1 via NASAL
  Filled 2023-01-22: qty 22

## 2023-01-22 MED ORDER — HEPARIN BOLUS VIA INFUSION
1500.0000 [IU] | Freq: Once | INTRAVENOUS | Status: AC
  Administered 2023-01-22: 1500 [IU] via INTRAVENOUS
  Filled 2023-01-22: qty 1500

## 2023-01-22 MED ORDER — PANTOPRAZOLE SODIUM 40 MG PO TBEC
40.0000 mg | DELAYED_RELEASE_TABLET | Freq: Every day | ORAL | Status: DC
  Filled 2023-01-22: qty 1

## 2023-01-22 MED ORDER — LORAZEPAM 2 MG/ML IJ SOLN
2.0000 mg | INTRAMUSCULAR | Status: DC | PRN
  Administered 2023-01-22: 2 mg via INTRAVENOUS
  Filled 2023-01-22: qty 1

## 2023-01-22 MED ORDER — SODIUM CHLORIDE 0.9 % IV SOLN
2.0000 g | Freq: Once | INTRAVENOUS | Status: AC
  Administered 2023-01-22: 2 g via INTRAVENOUS
  Filled 2023-01-22: qty 20

## 2023-01-22 MED ORDER — LORAZEPAM 2 MG/ML IJ SOLN
4.0000 mg | INTRAMUSCULAR | Status: DC | PRN
  Administered 2023-01-22 (×3): 4 mg via INTRAVENOUS
  Filled 2023-01-22 (×3): qty 2

## 2023-01-22 MED ORDER — CLONIDINE HCL 0.1 MG/24HR TD PTWK
0.1000 mg | MEDICATED_PATCH | TRANSDERMAL | Status: DC
  Administered 2023-01-22: 0.1 mg via TRANSDERMAL
  Filled 2023-01-22: qty 1

## 2023-01-22 MED ORDER — METHYLPREDNISOLONE SODIUM SUCC 40 MG IJ SOLR
40.0000 mg | Freq: Two times a day (BID) | INTRAMUSCULAR | Status: DC
  Administered 2023-01-22 – 2023-01-23 (×4): 40 mg via INTRAVENOUS
  Filled 2023-01-22 (×4): qty 1

## 2023-01-22 MED ORDER — LACTATED RINGERS IV SOLN: INTRAVENOUS | Status: AC

## 2023-01-22 MED ORDER — KETOROLAC TROMETHAMINE 15 MG/ML IJ SOLN
15.0000 mg | Freq: Once | INTRAMUSCULAR | Status: AC
  Administered 2023-01-22: 15 mg via INTRAVENOUS
  Filled 2023-01-22: qty 1

## 2023-01-22 MED ORDER — HYDROXYZINE HCL 25 MG PO TABS
25.0000 mg | ORAL_TABLET | Freq: Four times a day (QID) | ORAL | Status: DC | PRN
  Administered 2023-01-22: 25 mg via ORAL
  Filled 2023-01-22: qty 1

## 2023-01-22 MED ORDER — LABETALOL HCL 5 MG/ML IV SOLN
10.0000 mg | INTRAVENOUS | Status: DC | PRN
  Filled 2023-01-22: qty 4

## 2023-01-22 MED ORDER — IPRATROPIUM-ALBUTEROL 0.5-2.5 (3) MG/3ML IN SOLN
3.0000 mL | Freq: Four times a day (QID) | RESPIRATORY_TRACT | Status: DC
  Administered 2023-01-23 – 2023-01-24 (×5): 3 mL via RESPIRATORY_TRACT
  Filled 2023-01-22 (×5): qty 3

## 2023-01-22 MED ORDER — DEXTROSE 5 % IV SOLN
500.0000 mg | INTRAVENOUS | Status: DC
  Administered 2023-01-22 – 2023-01-24 (×3): 500 mg via INTRAVENOUS
  Filled 2023-01-22: qty 5

## 2023-01-22 MED ORDER — POTASSIUM CHLORIDE CRYS ER 20 MEQ PO TBCR: 40.0000 meq | EXTENDED_RELEASE_TABLET | Freq: Two times a day (BID) | ORAL | Status: AC

## 2023-01-22 MED ORDER — SODIUM CHLORIDE 0.9 % IV SOLN
2.0000 g | INTRAVENOUS | Status: DC
  Administered 2023-01-23 – 2023-01-24 (×2): 2 g via INTRAVENOUS
  Filled 2023-01-22 (×2): qty 20

## 2023-01-22 MED ORDER — CLONIDINE HCL 0.1 MG PO TABS: 0.1000 mg | ORAL_TABLET | Freq: Three times a day (TID) | ORAL | Status: DC

## 2023-01-22 MED ORDER — CALCIUM CARBONATE 1250 (500 CA) MG PO TABS
1.0000 | ORAL_TABLET | Freq: Every day | ORAL | Status: DC
  Filled 2023-01-22: qty 1

## 2023-01-22 MED ORDER — METOCLOPRAMIDE HCL 5 MG/ML IJ SOLN
10.0000 mg | Freq: Four times a day (QID) | INTRAMUSCULAR | Status: DC
  Administered 2023-01-22 – 2023-01-24 (×8): 10 mg via INTRAVENOUS
  Filled 2023-01-22 (×8): qty 2

## 2023-01-22 MED ORDER — IPRATROPIUM-ALBUTEROL 0.5-2.5 (3) MG/3ML IN SOLN
3.0000 mL | Freq: Four times a day (QID) | RESPIRATORY_TRACT | Status: DC | PRN
  Administered 2023-01-22 – 2023-01-23 (×3): 3 mL via RESPIRATORY_TRACT
  Filled 2023-01-22 (×3): qty 3

## 2023-01-22 MED ORDER — PROCHLORPERAZINE EDISYLATE 10 MG/2ML IJ SOLN
10.0000 mg | Freq: Four times a day (QID) | INTRAMUSCULAR | Status: DC | PRN
  Administered 2023-01-22: 10 mg via INTRAVENOUS
  Filled 2023-01-22: qty 2

## 2023-01-22 MED ORDER — BISOPROLOL FUMARATE 5 MG PO TABS: 5.0000 mg | ORAL_TABLET | Freq: Every day | ORAL | Status: DC

## 2023-01-22 MED ORDER — BUPRENORPHINE HCL-NALOXONE HCL 8-2 MG SL SUBL
1.0000 | SUBLINGUAL_TABLET | Freq: Once | SUBLINGUAL | Status: AC
  Administered 2023-01-22: 1 via SUBLINGUAL
  Filled 2023-01-22: qty 1

## 2023-01-22 MED ORDER — DIPHENHYDRAMINE HCL 50 MG/ML IJ SOLN
25.0000 mg | Freq: Once | INTRAMUSCULAR | Status: AC
  Administered 2023-01-22: 25 mg via INTRAVENOUS
  Filled 2023-01-22: qty 1

## 2023-01-22 MED ORDER — MELATONIN 3 MG PO TABS: 6.0000 mg | ORAL_TABLET | Freq: Once | ORAL | Status: DC

## 2023-01-22 MED ORDER — ATORVASTATIN CALCIUM 40 MG PO TABS: 80.0000 mg | ORAL_TABLET | Freq: Every day | ORAL | Status: DC

## 2023-01-22 MED ORDER — POTASSIUM CHLORIDE 10 MEQ/100ML IV SOLN
10.0000 meq | INTRAVENOUS | Status: AC
Start: 2023-01-22 — End: 2023-01-23
  Administered 2023-01-22 (×4): 10 meq via INTRAVENOUS
  Filled 2023-01-22 (×4): qty 100

## 2023-01-22 MED ORDER — CHLORHEXIDINE GLUCONATE CLOTH 2 % EX PADS
6.0000 | MEDICATED_PAD | Freq: Every day | CUTANEOUS | Status: DC
  Administered 2023-01-22 – 2023-01-24 (×3): 6 via TOPICAL

## 2023-01-22 MED ORDER — PANTOPRAZOLE SODIUM 40 MG IV SOLR
40.0000 mg | INTRAVENOUS | Status: DC
  Administered 2023-01-22 – 2023-01-23 (×2): 40 mg via INTRAVENOUS
  Filled 2023-01-22 (×2): qty 10

## 2023-01-22 MED ORDER — ASPIRIN 81 MG PO CHEW: 81.0000 mg | CHEWABLE_TABLET | Freq: Once | ORAL | Status: DC

## 2023-01-22 NOTE — Progress Notes (Addendum)
PHARMACY - ANTICOAGULATION CONSULT NOTE  Pharmacy Consult for heparin infusion Indication: concern for PE, elevated troponin  Allergies  Allergen Reactions   Bactrim [Sulfamethoxazole-Trimethoprim] Nausea And Vomiting   Ciprofloxacin Nausea And Vomiting   Darvocet [Propoxyphene N-Acetaminophen] Hives   Other     darvocet   Penicillins Itching   Penicillins Hives   Bactrim [Sulfamethoxazole-Trimethoprim] Rash   Ciprofloxacin Rash    Patient Measurements: Height: 5\' 4"  (162.6 cm) Weight: 75.8 kg (167 lb 1.7 oz) IBW/kg (Calculated) : 54.7 Heparin Dosing Weight: 60 kg  Vital Signs: Temp: 97.9 F (36.6 C) (11/19 0145) Temp Source: Oral (11/19 0145) BP: 146/78 (11/19 0600) Pulse Rate: 91 (11/19 0600)  Labs: Recent Labs    01/21/23 1830 01/21/23 2012 01/21/23 2357 01/22/23 0046 01/22/23 0436  HGB 16.7*  --   --   --  16.0*  HCT 48.8*  --   --   --  46.3*  PLT 270  --   --   --  270  APTT 39*  --   --   --   --   LABPROT 14.0  --   --   --   --   INR 1.1  --   --   --   --   HEPARINUNFRC  --   --   --   --  0.19*  CREATININE 0.51  --   --   --  0.55  TROPONINIHS 73* 161* 265* 212*  --     Estimated Creatinine Clearance: 95.9 mL/min (by C-G formula based on SCr of 0.55 mg/dL).   Medical History: Past Medical History:  Diagnosis Date   Asthma    Seizures (HCC)     Assessment:  37 y.o. female, history of PE, multifocal pneumonia, history of tobacco use, smokes 1 pack/day, who presents to the ED secondary to severe shortness of breath that came on acutely. A review of dispense history reveals no chronic anticoagulation prior to arrival and RN confirms no recent use.  CTA is negative for pulmonary embolus. Cardiology to see.   HL 0.19, subtherapeutic. No issues with infusion per RN  Goal of Therapy:  Heparin level 0.3-0.7 units/ml Monitor platelets by anticoagulation protocol: Yes   Plan:  ---Give 1500 units bolus x 1 --- Increase heparin infusion to 1300  units/hr ---Check anti-Xa level and aPTT level in 6 hours and daily while on heparin ---Continue to monitor H&H and platelets F/U plan  Elder Cyphers, BS Pharm D, BCPS Clinical Pharmacist 01/22/2023,7:26 AM

## 2023-01-22 NOTE — Consult Note (Addendum)
Cardiology Consultation   Patient ID: Diane Norton MRN: [id number]; DOB: 02-04-1986  Admit date: 01/21/2023 Date of Consult: 01/22/2023  PCP:  Patient, No Pcp Per   [facility] Providers Cardiologist:  None        Patient Profile:   Diane Norton is a 37 y.o. female with a hx of PE 2021 previously on eliquis, GERD, tobacco abuse, heroin abuse who is being seen 01/22/2023 for the evaluation of troponins at the request of Dr. Sherryll Burger.  History of Present Illness:   Diane Norton with above PMHx who presents with SOB and chest pressure. CT early pneumonia, no PE. Was given Suboxone and benadry for heroin withdrawal. Troponin 73,161, [id number].No acute EKG findings. She is still complaining of left chest pain and shoulder pain, asking for pain meds.Smokes 1 ppd and snorts 1/2 gram heroin daily. Works doing Research scientist (physical sciences). No regular exercise. Grandparents with MI's but no details.    Past Medical History:  Diagnosis Date   Asthma    Seizures (HCC)     Past Surgical History:  Procedure Laterality Date   CHOLECYSTECTOMY     FEMUR IM NAIL Right 01/09/2015   Procedure: INTRAMEDULLARY (IM) RETROGRADE FEMORAL NAILING; RIGHT;  Surgeon: Nadara Mustard, MD;  Location: [facility] OR;  Service: Orthopedics;  Laterality: Right;   ORIF PATELLA Right 01/09/2015   Procedure: OPEN REDUCTION INTERNAL (ORIF) FIXATION PATELLA; RIGHT;  Surgeon: Nadara Mustard, MD;  Location: [facility] OR;  Service: Orthopedics;  Laterality: Right;     Home Medications:  Prior to Admission medications   Medication Sig Start Date End Date Taking? Authorizing Provider  acetaminophen (TYLENOL) 325 MG tablet Take 2 tablets (650 mg total) by mouth every 6 (six) hours as needed for mild pain (or Fever >/= 101). 11/05/19   Emokpae, Courage, MD  albuterol (VENTOLIN HFA) 108 (90 Base) MCG/ACT inhaler Inhale 2 puffs into the lungs every 6 (six) hours as needed for wheezing or shortness of breath. 11/05/19   Shon Hale, MD  apixaban  (ELIQUIS) 5 MG TABS tablet Take 1 tablet (5 mg total) by mouth 2 (two) times daily. Start on October 1st  after you finish the initial starter pack 12/04/19   Shon Hale, MD  Apixaban Starter Pack, 10mg  and 5mg , (ELIQUIS DVT/PE STARTER PACK) Take as directed on package: start with two-5mg  tablets twice daily for 7 days. On day 8, switch to one-5mg  tablet twice daily. 11/05/19   Shon Hale, MD  doxycycline (VIBRAMYCIN) 100 MG capsule Take 1 capsule (100 mg total) by mouth 2 (two) times daily. 01/11/20   Wurst, [place], PA-C  guaiFENesin (MUCINEX) 600 MG 12 hr tablet Take 1 tablet (600 mg total) by mouth 2 (two) times daily. 11/05/19   Shon Hale, MD  Multiple Vitamin (MULTIVITAMIN WITH MINERALS) TABS tablet Take 1 tablet by mouth daily. 11/06/19   Shon Hale, MD  omeprazole (PRILOSEC) 20 MG capsule Take 1 capsule (20 mg total) by mouth daily. 11/05/19 11/04/20  Shon Hale, MD  traZODone (DESYREL) 150 MG tablet Take 1 tablet (150 mg total) by mouth at bedtime. 11/05/19   Shon Hale, MD    Inpatient Medications: Scheduled Meds:  albuterol       buprenorphine-naloxone  1 tablet Sublingual Once   calcium carbonate  1 tablet Oral Q breakfast   Chlorhexidine Gluconate Cloth  6 each Topical Daily   ipratropium-albuterol       melatonin  6 mg Oral Once   metoCLOPramide (REGLAN) injection  10  mg Intravenous Q6H   pantoprazole  40 mg Oral Daily   potassium chloride  40 mEq Oral BID   Continuous Infusions:  azithromycin 500 mg (01/22/23 0156)   [START ON 01/23/2023] cefTRIAXone (ROCEPHIN)  IV     heparin 1,300 Units/hr (01/22/23 0800)   PRN Meds: albuterol, hydrOXYzine, ipratropium-albuterol, prochlorperazine  Allergies:    Allergies  Allergen Reactions   Bactrim [Sulfamethoxazole-Trimethoprim] Nausea And Vomiting   Ciprofloxacin Nausea And Vomiting   Darvocet [Propoxyphene N-Acetaminophen] Hives   Other     darvocet   Penicillins Itching   Penicillins Hives    Bactrim [Sulfamethoxazole-Trimethoprim] Rash   Ciprofloxacin Rash    Social History:   Social History   Socioeconomic History   Marital status: Divorced    Spouse name: Not on file   Number of children: Not on file   Years of education: Not on file   Highest education level: Not on file  Occupational History   Not on file  Tobacco Use   Smoking status: Every Day    Current packs/day: 1.00    Types: Cigarettes   Smokeless tobacco: Never  Vaping Use   Vaping status: Never Used  Substance and Sexual Activity   Alcohol use: No   Drug use: Yes    Comment: Heroin   Sexual activity: Yes    Birth control/protection: None  Other Topics Concern   Not on file  Social History Narrative   ** Merged History Encounter **       Social Determinants of Health   Financial Resource Strain: Not on file  Food Insecurity: No Food Insecurity (01/22/2023)   Hunger Vital Sign    Worried About Running Out of Food in the Last Year: Never true    Ran Out of Food in the Last Year: Never true  Transportation Needs: No Transportation Needs (01/22/2023)   PRAPARE - Administrator, Civil Service (Medical): No    Lack of Transportation (Non-Medical): No  Physical Activity: Not on file  Stress: Not on file  Social Connections: Not on file  Intimate Partner Violence: Not At Risk (01/22/2023)   Humiliation, Afraid, Rape, and Kick questionnaire    Fear of Current or Ex-Partner: No    Emotionally Abused: No    Physically Abused: No    Sexually Abused: No    Family History:    History reviewed. No pertinent family history.   ROS:  Please see the history of present illness.  Review of Systems  Constitutional: Negative.  HENT: Negative.    Eyes: Negative.   Cardiovascular:  Positive for chest pain and dyspnea on exertion.  Respiratory:  Positive for cough, shortness of breath and wheezing.   Hematologic/Lymphatic: Negative.   Musculoskeletal: Negative.  Negative for joint pain.   Gastrointestinal: Negative.   Genitourinary: Negative.   Neurological: Negative.     All other ROS reviewed and negative.     Physical Exam/Data:   Vitals:   01/22/23 0505 01/22/23 0506 01/22/23 0600 01/22/23 0700  BP: (!) 157/100 (!) 157/100 (!) 146/78 139/78  Pulse: 79 82 91 81  Resp: (!) 21  (!) 23 (!) 28  Temp:      TempSrc:      SpO2: 96% 96% 100% 95%  Weight:      Height:       No intake or output data in the 24 hours ending 01/22/23 0837    01/22/2023    1:45 AM 01/21/2023    6:01 PM 01/11/2020  3:28 PM  Last 3 Weights  Weight (lbs) 167 lb 1.7 oz 132 lb 4.4 oz 130 lb  Weight (kg) 75.8 kg 60 kg 58.968 kg     Body mass index is 28.68 kg/m.  General:  Well nourished, well developed, asking for pain meds HEENT: normal Neck: no JVD Vascular: No carotid bruits; Distal pulses 2+ bilaterally Cardiac:  normal S1, S2; RRR; 1/6 systolic murmur LSB Lungs:  diffuse wheezing and rhonchi Abd: soft, nontender, no hepatomegaly  Ext: no edema Musculoskeletal:  No deformities, BUE and BLE strength normal and equal Skin: warm and dry  Neuro:  CNs 2-12 intact, no focal abnormalities noted Psych:  Normal affect   EKG:  The EKG was personally reviewed and demonstrates:  NSR with nonspecific ST changes Telemetry:  Telemetry was personally reviewed and demonstrates:  NSR with PVC's, couplets  Relevant CV Studies:    Laboratory Data:  High Sensitivity Troponin:   Recent Labs  Lab 01/21/23 1830 01/21/23 2012 01/21/23 2357 01/22/23 0046  TROPONINIHS 73* 161* 265* 212*     Chemistry Recent Labs  Lab 01/21/23 1830 01/22/23 0436  NA 136 136  K 3.9 3.4*  CL 105 100  CO2 20* 22  GLUCOSE 102* 133*  BUN 6 8  CREATININE 0.51 0.55  CALCIUM 8.6* 9.0  MG  --  1.9  GFRNONAA >60 >60  ANIONGAP 11 14    Recent Labs  Lab 01/21/23 1830 01/22/23 0436  PROT 8.1 7.7  ALBUMIN 4.1 3.8  AST 15 16  ALT 14 14  ALKPHOS 90 84  BILITOT 0.4 0.4   Lipids No results for  input(s): "CHOL", "TRIG", "HDL", "LABVLDL", "LDLCALC", "CHOLHDL" in the last 168 hours.  Hematology Recent Labs  Lab 01/21/23 1830 01/22/23 0436  WBC 9.6 8.6  RBC 5.42* 5.17*  HGB 16.7* 16.0*  HCT 48.8* 46.3*  MCV 90.0 89.6  MCH 30.8 30.9  MCHC 34.2 34.6  RDW 13.0 12.9  PLT 270 270   Thyroid No results for input(s): "TSH", "FREET4" in the last 168 hours.  BNPNo results for input(s): "BNP", "PROBNP" in the last 168 hours.  DDimer No results for input(s): "DDIMER" in the last 168 hours.   Radiology/Studies:  CT Angio Chest PE W and/or Wo Contrast  Result Date: 01/21/2023 CLINICAL DATA:  Pulmonary embolism (PE) suspected, high prob. Shortness of breath. Low O2 sats. EXAM: CT ANGIOGRAPHY CHEST WITH CONTRAST TECHNIQUE: Multidetector CT imaging of the chest was performed using the standard protocol during bolus administration of intravenous contrast. Multiplanar CT image reconstructions and MIPs were obtained to evaluate the vascular anatomy. RADIATION DOSE REDUCTION: This exam was performed according to the departmental dose-optimization program which includes automated exposure control, adjustment of the mA and/or kV according to patient size and/or use of iterative reconstruction technique. CONTRAST:  75mL OMNIPAQUE IOHEXOL 350 MG/ML SOLN COMPARISON:  10/28/2019 FINDINGS: Cardiovascular: No filling defects in the pulmonary arteries to suggest pulmonary emboli. Heart is normal size. Aorta is normal caliber. Mediastinum/Nodes: No mediastinal, hilar, or axillary adenopathy. Trachea and esophagus are unremarkable. Thyroid unremarkable. Lungs/Pleura: Centrilobular and paraseptal emphysema. Ground-glass airspace opacities scattered in the inferior right upper lobe and left lower lobe, but most pronounced in the right middle lobe. Findings concerning for early multifocal pneumonia. No effusions. Upper Abdomen: No acute findings Musculoskeletal: Chest wall soft tissues are unremarkable. Review of the  MIP images confirms the above findings. IMPRESSION: No evidence of pulmonary embolus. Patchy ground-glass opacities bilaterally, most pronounced in the right middle lobe concerning  for early pneumonia. Emphysema (ICD10-J43.9). Electronically Signed   By: Charlett Nose M.D.   On: 01/21/2023 23:58   DG Chest Portable 1 View  Result Date: 01/21/2023 CLINICAL DATA:  Shortness of breath for 2 days EXAM: PORTABLE CHEST 1 VIEW COMPARISON:  11/04/2019 FINDINGS: Cardiac shadow is within normal limits. The lungs are well aerated bilaterally. No focal infiltrate or effusion is seen. No bony abnormality is noted. IMPRESSION: No active disease. Electronically Signed   By: Alcide Clever M.D.   On: 01/21/2023 21:10     Assessment and Plan:   NSTEMI with chest pain & Elevated troponins in the setting of pneumonia and heroin abuse. EKG without acute change. Check echo. Patient still having chest and arm pain on IV heparin, asking for pain meds. Will discuss with MD. Add ASA, lipitor, low dose bisoprolol although refusing oral meds at this time. FLP  Pneumonia on antibiotics  History of PE 2021 on Eliquis but hasn't been on in several years  Tobacco abuse-emphysema on CT  Heroin abuse 1/2 gram daily   Risk Assessment/Risk Scores:     TIMI Risk Score for Unstable Angina or Non-ST Elevation MI:   The patient's TIMI risk score is 2, which indicates a 8% risk of all cause mortality, new or recurrent myocardial infarction or need for urgent revascularization in the next 14 days.          For questions or updates, please contact [facility] Please consult [email or web address] for contact info under    Signed, Jacolyn Reedy, PA-C  01/22/2023 8:37 AM

## 2023-01-22 NOTE — H&P (Signed)
History and Physical    Patient: Diane Norton JYN:[id number] DOB: [birth year] DOA: 01/21/2023 DOS: the patient was seen and examined on 01/22/2023 PCP: Patient, No Pcp Per  Patient coming from: Home  Chief Complaint:  Chief Complaint  Patient presents with   Shortness of Breath   HPI: Diane Norton is a 37 y.o. female with medical history significant of PE, GERD, heroin abuse, tobacco abuse who presents to the emergency department from home via EMS due to shortness of breath which started yesterday and was associated with chest pain which was prescribed as pressure-like and Holts with deep breath.  She complained of several days of upper respiratory tract infection symptoms including nasal congestion, sore throat, productive cough which has resulted in her smoking less since she was not feeling good.  ED Course:  In the emergency department, he was hemodynamically stable.  Workup in the ED showed normocytic anemia, BMP was normal except for bicarb of 20 and blood glucose of 102, calcium 8.6.  Troponin 161 > 73 > 265 > 212.  Influenza A, B, SARS coronavirus 2, RSV was negative.  Blood culture pending. Chest x-ray showed no active disease CT angiography chest with contrast showed no evidence of pulmonary embolus.  Patchy groundglass opacities bilaterally, most pronounced in the right middle lobe concerning for early pneumonia. She was treated with ceftriaxone and azithromycin.  Present treatment was provided, Suboxone and Benadryl were given due to withdrawal from heroin. Cardiologist on-call was consulted and recommended admitting the patient here at [facility] with plan for cardiology to consult on patient in the morning per ED PA Hospitalist was asked to admit patient for further evaluation and management.  Review of Systems: Review of systems as noted in the HPI. All other systems reviewed and are negative.   Past Medical History:  Diagnosis Date   Asthma    Seizures (HCC)    Past  Surgical History:  Procedure Laterality Date   CHOLECYSTECTOMY     FEMUR IM NAIL Right 01/09/2015   Procedure: INTRAMEDULLARY (IM) RETROGRADE FEMORAL NAILING; RIGHT;  Surgeon: Nadara Mustard, MD;  Location: [facility] OR;  Service: Orthopedics;  Laterality: Right;   ORIF PATELLA Right 01/09/2015   Procedure: OPEN REDUCTION INTERNAL (ORIF) FIXATION PATELLA; RIGHT;  Surgeon: Nadara Mustard, MD;  Location: [facility] OR;  Service: Orthopedics;  Laterality: Right;    Social History:  reports that she has been smoking cigarettes. She has never used smokeless tobacco. She reports current drug use. She reports that she does not drink alcohol.   Allergies  Allergen Reactions   Bactrim [Sulfamethoxazole-Trimethoprim] Nausea And Vomiting   Ciprofloxacin Nausea And Vomiting   Darvocet [Propoxyphene N-Acetaminophen] Hives   Other     darvocet   Penicillins Itching   Penicillins Hives   Bactrim [Sulfamethoxazole-Trimethoprim] Rash   Ciprofloxacin Rash    History reviewed. No pertinent family history.   Prior to Admission medications   Medication Sig Start Date End Date Taking? Authorizing Provider  acetaminophen (TYLENOL) 325 MG tablet Take 2 tablets (650 mg total) by mouth every 6 (six) hours as needed for mild pain (or Fever >/= 101). 11/05/19   Emokpae, Courage, MD  albuterol (VENTOLIN HFA) 108 (90 Base) MCG/ACT inhaler Inhale 2 puffs into the lungs every 6 (six) hours as needed for wheezing or shortness of breath. 11/05/19   Shon Hale, MD  apixaban (ELIQUIS) 5 MG TABS tablet Take 1 tablet (5 mg total) by mouth 2 (two) times daily. Start on October 1st  after you finish the initial starter pack 12/04/19   Shon Hale, MD  Apixaban Starter Pack, 10mg  and 5mg , (ELIQUIS DVT/PE STARTER PACK) Take as directed on package: start with two-5mg  tablets twice daily for 7 days. On day 8, switch to one-5mg  tablet twice daily. 11/05/19   Shon Hale, MD  doxycycline (VIBRAMYCIN) 100 MG capsule Take 1 capsule (100 mg  total) by mouth 2 (two) times daily. 01/11/20   Wurst, [place], PA-C  guaiFENesin (MUCINEX) 600 MG 12 hr tablet Take 1 tablet (600 mg total) by mouth 2 (two) times daily. 11/05/19   Shon Hale, MD  Multiple Vitamin (MULTIVITAMIN WITH MINERALS) TABS tablet Take 1 tablet by mouth daily. 11/06/19   Shon Hale, MD  omeprazole (PRILOSEC) 20 MG capsule Take 1 capsule (20 mg total) by mouth daily. 11/05/19 11/04/20  Shon Hale, MD  traZODone (DESYREL) 150 MG tablet Take 1 tablet (150 mg total) by mouth at bedtime. 11/05/19   Shon Hale, MD    Physical Exam: BP 129/65   Pulse 78   Temp 97.9 F (36.6 C) (Oral)   Resp (!) 25   Ht 5\' 4"  (1.626 m)   Wt 75.8 kg   LMP 01/01/2023   SpO2 95%   BMI 28.68 kg/m   General: 37 y.o. year-old female well developed well nourished in no acute distress.  Alert and oriented x3. HEENT: NCAT, EOMI Neck: Supple, trachea medial Cardiovascular: Regular rate and rhythm with no rubs or gallops.  No thyromegaly or JVD noted.  No lower extremity edema. 2/4 pulses in all 4 extremities. Respiratory: Clear to auscultation with no wheezes or rales. Good inspiratory effort. Abdomen: Soft, nontender nondistended with normal bowel sounds x4 quadrants. Muskuloskeletal: No cyanosis, clubbing or edema noted bilaterally Neuro: CN II-XII intact, strength 5/5 x 4, sensation, reflexes intact Skin: No ulcerative lesions noted or rashes Psychiatry: Judgement and insight appear normal. Mood is appropriate for condition and setting          Labs on Admission:  Basic Metabolic Panel: Recent Labs  Lab 01/21/23 1830  NA 136  K 3.9  CL 105  CO2 20*  GLUCOSE 102*  BUN 6  CREATININE 0.51  CALCIUM 8.6*   Liver Function Tests: Recent Labs  Lab 01/21/23 1830  AST 15  ALT 14  ALKPHOS 90  BILITOT 0.4  PROT 8.1  ALBUMIN 4.1   No results for input(s): "LIPASE", "AMYLASE" in the last 168 hours. No results for input(s): "AMMONIA" in the last 168  hours. CBC: Recent Labs  Lab 01/21/23 1830  WBC 9.6  NEUTROABS 7.5  HGB 16.7*  HCT 48.8*  MCV 90.0  PLT 270   Cardiac Enzymes: No results for input(s): "CKTOTAL", "CKMB", "CKMBINDEX", "TROPONINI" in the last 168 hours.  BNP (last 3 results) No results for input(s): "BNP" in the last 8760 hours.  ProBNP (last 3 results) No results for input(s): "PROBNP" in the last 8760 hours.  CBG: No results for input(s): "GLUCAP" in the last 168 hours.  Radiological Exams on Admission: CT Angio Chest PE W and/or Wo Contrast  Result Date: 01/21/2023 CLINICAL DATA:  Pulmonary embolism (PE) suspected, high prob. Shortness of breath. Low O2 sats. EXAM: CT ANGIOGRAPHY CHEST WITH CONTRAST TECHNIQUE: Multidetector CT imaging of the chest was performed using the standard protocol during bolus administration of intravenous contrast. Multiplanar CT image reconstructions and MIPs were obtained to evaluate the vascular anatomy. RADIATION DOSE REDUCTION: This exam was performed according to the departmental dose-optimization program which includes automated  exposure control, adjustment of the mA and/or kV according to patient size and/or use of iterative reconstruction technique. CONTRAST:  75mL OMNIPAQUE IOHEXOL 350 MG/ML SOLN COMPARISON:  10/28/2019 FINDINGS: Cardiovascular: No filling defects in the pulmonary arteries to suggest pulmonary emboli. Heart is normal size. Aorta is normal caliber. Mediastinum/Nodes: No mediastinal, hilar, or axillary adenopathy. Trachea and esophagus are unremarkable. Thyroid unremarkable. Lungs/Pleura: Centrilobular and paraseptal emphysema. Ground-glass airspace opacities scattered in the inferior right upper lobe and left lower lobe, but most pronounced in the right middle lobe. Findings concerning for early multifocal pneumonia. No effusions. Upper Abdomen: No acute findings Musculoskeletal: Chest wall soft tissues are unremarkable. Review of the MIP images confirms the above  findings. IMPRESSION: No evidence of pulmonary embolus. Patchy ground-glass opacities bilaterally, most pronounced in the right middle lobe concerning for early pneumonia. Emphysema (ICD10-J43.9). Electronically Signed   By: Charlett Nose M.D.   On: 01/21/2023 23:58   DG Chest Portable 1 View  Result Date: 01/21/2023 CLINICAL DATA:  Shortness of breath for 2 days EXAM: PORTABLE CHEST 1 VIEW COMPARISON:  11/04/2019 FINDINGS: Cardiac shadow is within normal limits. The lungs are well aerated bilaterally. No focal infiltrate or effusion is seen. No bony abnormality is noted. IMPRESSION: No active disease. Electronically Signed   By: Alcide Clever M.D.   On: 01/21/2023 21:10    EKG: I independently viewed the EKG done and my findings are as followed: Normal sinus rhythm at a rate of 83 bpm with QTc of 502 ms  Assessment/Plan Present on Admission:  CAP (community acquired pneumonia)  Principal Problem:   CAP (community acquired pneumonia) Active Problems:   Elevated troponin   Heroin withdrawal (HCC)   Prolonged QT interval   Hypocalcemia   History of pulmonary embolism   GERD (gastroesophageal reflux disease)  CAP POA PORT/PSI of 37 points  indicating _0.6 -  0.9% mortality  Patient was started on ceftriaxone and azithromycin, we shall continue same at this time with plan to de-escalate/discontinue based on blood culture, sputum culture, urine Legionella, strep pneumo  Continue Tylenol as needed Continue Mucinex, incentive spirometry, flutter valve   Elevated troponin, rule out NSTEMI Troponin 161 > 73 > 265 > 212.  EKG personally reviewed showed normal sinus rhythm at rate of 83 bpm with QTc of 502 millisecond Patient was started on heparin drip Cardiology was consulted and recommended admitting patient here at [facility] with plan for cardiologist to consult on patient in the morning.  Heroin withdrawal Continue symptomatic treatment  Prolonged QT interval QTc 502 ms Avoid QT prolonging  drugs Magnesium level will be checked Repeat EKG in the morning  Hypocalcemia Calcium 8.6, continue Os-Cal  History of pulmonary embolism She was on Eliquis. Continue IV heparin drip for now  GERD Continue Protonix  DVT prophylaxis: Heparin drip  Code Status: Full code  Family Communication: None at bedside  Consults: Cardiology  Severity of Illness: The appropriate patient status for this patient is INPATIENT. Inpatient status is judged to be reasonable and necessary in order to provide the required intensity of service to ensure the patient's safety. The patient's presenting symptoms, physical exam findings, and initial radiographic and laboratory data in the context of their chronic comorbidities is felt to place them at high risk for further clinical deterioration. Furthermore, it is not anticipated that the patient will be medically stable for discharge from the hospital within 2 midnights of admission.   * I certify that at the point of admission it is my clinical  judgment that the patient will require inpatient hospital care spanning beyond 2 midnights from the point of admission due to high intensity of service, high risk for further deterioration and high frequency of surveillance required.*  Author: Frankey Shown, DO 01/22/2023 3:43 AM  For on call review [email or web address].

## 2023-01-22 NOTE — Progress Notes (Signed)
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 Or Actions Taken?:  Standing orders placed for Bactroban Ointment BID

## 2023-01-22 NOTE — Progress Notes (Signed)
Diane Norton is a 37 y.o. female with medical history significant of PE, GERD, heroin abuse, tobacco abuse who presents to the emergency department from home via EMS due to shortness of breath which started yesterday and was associated with chest pain which was prescribed as pressure-like.  Patient was admitted with elevated troponin suspicious for NSTEMI as well as community-acquired pneumonia and has symptoms of heroin withdrawal.  Patient seen and evaluated at bedside and has been admitted after midnight.  She has had significant agitation likely related to heroin withdrawal throughout the course of the day.  It appears that she may have used heroin last 2 days ago.  She has been started on Suboxone as well as some Benadryl and clonidine.  Ativan has been ordered as needed as well.  Her respiratory condition has worsened throughout the course of the day requiring BiPAP.  CT of the chest does not demonstrate PE.  She will remain on antibiotics as prescribed along with IV steroids which have been initiated along with breathing treatments.  She has been seen by cardiology and appears to have demand ischemia and therefore heparin drip has been discontinued.  2D echocardiogram ordered and pending.  A.m. labs ordered.  She has a high risk for further deterioration and therefore requires ongoing ICU care.  Total critical care time: 65 minutes.

## 2023-01-22 NOTE — Progress Notes (Signed)
   01/22/23 1018  TOC Brief Assessment  Insurance and Status Reviewed  Patient has primary care physician No  Home environment has been reviewed from home  Prior level of function: independent  Prior/Current Home Services No current home services  Social Determinants of Health Reivew SDOH reviewed no interventions necessary  Readmission risk has been reviewed Yes  Transition of care needs transition of care needs identified, TOC will continue to follow    PCP list added to pt's AVS.  Transition of Care Department (TOC) has reviewed patient and no other TOC needs have been identified at this time. We will continue to monitor patient advancement through interdisciplinary progression rounds. If new patient transition needs arise, please place a TOC consult.

## 2023-01-22 NOTE — ED Notes (Signed)
ED TO INPATIENT HANDOFF REPORT  ED Nurse Name and Phone #: Jacques Earthly Name/Age/Gender Diane Norton 37 y.o. female Room/Bed: APA04/APA04  Code Status   Code Status: Prior  Home/SNF/Other Home Patient oriented to: self, place, time, and situation Is this baseline? Yes   Triage Complete: Triage complete  Chief Complaint CAP (community acquired pneumonia) [J18.9]  Triage Note Pt c/o sob that started yesterday. O2 was 93 on room air , pt brought in by ems on NRB    Allergies Allergies  Allergen Reactions   Bactrim [Sulfamethoxazole-Trimethoprim] Nausea And Vomiting   Ciprofloxacin Nausea And Vomiting   Darvocet [Propoxyphene N-Acetaminophen] Hives   Other     darvocet   Penicillins Itching   Penicillins Hives   Bactrim [Sulfamethoxazole-Trimethoprim] Rash   Ciprofloxacin Rash    Level of Care/Admitting Diagnosis ED Disposition     ED Disposition  Admit   Condition  --   Comment  Hospital Area: [facility] [[id number]]  Level of Care: Stepdown [14]  Covid Evaluation: Asymptomatic - no recent exposure (last 10 days) testing not required  Diagnosis: CAP (community acquired pneumonia) [161096]  Admitting Physician: Frankey Shown [[id number]]  Attending Physician: Frankey Shown [[id number]]  Certification:: I certify this patient will need inpatient services for at least 2 midnights  Expected Medical Readiness: 01/25/2023          B Medical/Surgery History Past Medical History:  Diagnosis Date   Asthma    Seizures (HCC)    Past Surgical History:  Procedure Laterality Date   CHOLECYSTECTOMY     FEMUR IM NAIL Right 01/09/2015   Procedure: INTRAMEDULLARY (IM) RETROGRADE FEMORAL NAILING; RIGHT;  Surgeon: Nadara Mustard, MD;  Location: [facility] OR;  Service: Orthopedics;  Laterality: Right;   ORIF PATELLA Right 01/09/2015   Procedure: OPEN REDUCTION INTERNAL (ORIF) FIXATION PATELLA; RIGHT;  Surgeon: Nadara Mustard, MD;  Location: [facility] OR;  Service: Orthopedics;   Laterality: Right;     A IV Location/Drains/Wounds Patient Lines/Drains/Airways Status     Active Line/Drains/Airways     Name Placement date Placement time Site Days   Peripheral IV 01/21/23 20 G Anterior;Right Forearm 01/21/23  2357  Forearm  1   Incision (Closed) 01/10/15 Leg Right 01/10/15  0156  -- 2934            Intake/Output Last 24 hours No intake or output data in the 24 hours ending 01/22/23 0051  Labs/Imaging Results for orders placed or performed during the hospital encounter of 01/21/23 (from the past 48 hour(s))  hCG, quantitative, pregnancy     Status: None   Collection Time: 01/21/23  6:30 PM  Result Value Ref Range   hCG, Beta Chain, Quant, S <1 <5 mIU/mL    Comment:          GEST. AGE      CONC.  (mIU/mL)   <=1 WEEK        5 - 50     2 WEEKS       50 - 500     3 WEEKS       100 - 10,000     4 WEEKS     1,000 - 30,000     5 WEEKS     3,500 - 115,000   6-8 WEEKS     12,000 - 270,000    12 WEEKS     15,000 - 220,000        FEMALE AND NON-PREGNANT FEMALE:     LESS  THAN 5 mIU/mL Performed at [facility], [address] [place], [place] [address]   CBC with Differential     Status: Abnormal   Collection Time: 01/21/23  6:30 PM  Result Value Ref Range   WBC 9.6 4.0 - 10.5 K/uL   RBC 5.42 (H) 3.87 - 5.11 MIL/uL   Hemoglobin 16.7 (H) 12.0 - 15.0 g/dL   HCT 96.2 (H) 95.2 - 84.1 %   MCV 90.0 80.0 - 100.0 fL   MCH 30.8 26.0 - 34.0 pg   MCHC 34.2 30.0 - 36.0 g/dL   RDW 32.4 40.1 - 02.7 %   Platelets 270 150 - 400 K/uL   nRBC 0.0 0.0 - 0.2 %   Neutrophils Relative % 78 %   Neutro Abs 7.5 1.7 - 7.7 K/uL   Lymphocytes Relative 10 %   Lymphs Abs 1.0 0.7 - 4.0 K/uL   Monocytes Relative 10 %   Monocytes Absolute 1.0 0.1 - 1.0 K/uL   Eosinophils Relative 1 %   Eosinophils Absolute 0.1 0.0 - 0.5 K/uL   Basophils Relative 1 %   Basophils Absolute 0.1 0.0 - 0.1 K/uL   Immature Granulocytes 0 %   Abs Immature Granulocytes 0.03 0.00 - 0.07 K/uL    Comment:  Performed at [facility], [address] [place], [place] [address]  Comprehensive metabolic panel     Status: Abnormal   Collection Time: 01/21/23  6:30 PM  Result Value Ref Range   Sodium 136 135 - 145 mmol/L   Potassium 3.9 3.5 - 5.1 mmol/L   Chloride 105 98 - 111 mmol/L   CO2 20 (L) 22 - 32 mmol/L   Glucose, Bld 102 (H) 70 - 99 mg/dL    Comment: Glucose reference range applies only to samples taken after fasting for at least 8 hours.   BUN 6 6 - 20 mg/dL   Creatinine, Ser 4.40 0.44 - 1.00 mg/dL   Calcium 8.6 (L) 8.9 - 10.3 mg/dL   Total Protein 8.1 6.5 - 8.1 g/dL   Albumin 4.1 3.5 - 5.0 g/dL   AST 15 15 - 41 U/L   ALT 14 0 - 44 U/L   Alkaline Phosphatase 90 38 - 126 U/L   Total Bilirubin 0.4 <1.2 mg/dL   GFR, Estimated >34 >74 mL/min    Comment: (NOTE) Calculated using the CKD-EPI Creatinine Equation (2021)    Anion gap 11 5 - 15    Comment: Performed at [facility], [address], [place], [place] [address]  Troponin I (High Sensitivity)     Status: Abnormal   Collection Time: 01/21/23  6:30 PM  Result Value Ref Range   Troponin I (High Sensitivity) 73 (H) <18 ng/L    Comment: (NOTE) Elevated high sensitivity troponin I (hsTnI) values and significant  changes across serial measurements may suggest ACS but many other  chronic and acute conditions are known to elevate hsTnI results.  Refer to the "Links" section for chest pain algorithms and additional  guidance. Performed at [facility], [address] [place], [place] [address]   Protime-INR     Status: None   Collection Time: 01/21/23  6:30 PM  Result Value Ref Range   Prothrombin Time 14.0 11.4 - 15.2 seconds   INR 1.1 0.8 - 1.2    Comment: (NOTE) INR goal varies based on device and disease states. Performed at [facility], [address] [place], [place] [address]   APTT     Status: Abnormal  Collection Time: 01/21/23  6:30 PM  Result Value Ref Range   aPTT 39 (H) 24 - 36 seconds    Comment:        IF  BASELINE aPTT IS ELEVATED, SUGGEST PATIENT RISK ASSESSMENT BE USED TO DETERMINE APPROPRIATE ANTICOAGULANT THERAPY. Performed at [facility], [address], [place], [place] [address]   Resp panel by RT-PCR (RSV, Flu A&B, Covid) Anterior Nasal Swab     Status: None   Collection Time: 01/21/23  7:41 PM   Specimen: Anterior Nasal Swab  Result Value Ref Range   SARS Coronavirus 2 by RT PCR NEGATIVE NEGATIVE    Comment: (NOTE) SARS-CoV-2 target nucleic acids are NOT DETECTED.  The SARS-CoV-2 RNA is generally detectable in upper respiratory specimens during the acute phase of infection. The lowest concentration of SARS-CoV-2 viral copies this assay can detect is 138 copies/mL. A negative result does not preclude SARS-Cov-2 infection and should not be used as the sole basis for treatment or other patient management decisions. A negative result may occur with  improper specimen collection/handling, submission of specimen other than nasopharyngeal swab, presence of viral mutation(s) within the areas targeted by this assay, and inadequate number of viral copies(<138 copies/mL). A negative result must be combined with clinical observations, patient history, and epidemiological information. The expected result is Negative.  Fact Sheet for Patients:  BloggerCourse.com  Fact Sheet for Healthcare Providers:  SeriousBroker.it  This test is no t yet approved or cleared by the [place] FDA and  has been authorized for detection and/or diagnosis of SARS-CoV-2 by FDA under an Emergency Use Authorization (EUA). This EUA will remain  in effect (meaning this test can be used) for the duration of the COVID-19 declaration under Section 564(b)(1) of the Act, 21 U.S.C.section 360bbb-3(b)(1), unless the authorization is terminated  or revoked sooner.       Influenza A by PCR NEGATIVE NEGATIVE   Influenza B by PCR NEGATIVE NEGATIVE    Comment:  (NOTE) The Xpert Xpress SARS-CoV-2/FLU/RSV plus assay is intended as an aid in the diagnosis of influenza from Nasopharyngeal swab specimens and should not be used as a sole basis for treatment. Nasal washings and aspirates are unacceptable for Xpert Xpress SARS-CoV-2/FLU/RSV testing.  Fact Sheet for Patients: BloggerCourse.com  Fact Sheet for Healthcare Providers: SeriousBroker.it  This test is not yet approved or cleared by the [place] FDA and has been authorized for detection and/or diagnosis of SARS-CoV-2 by FDA under an Emergency Use Authorization (EUA). This EUA will remain in effect (meaning this test can be used) for the duration of the COVID-19 declaration under Section 564(b)(1) of the Act, 21 U.S.C. section 360bbb-3(b)(1), unless the authorization is terminated or revoked.     Resp Syncytial Virus by PCR NEGATIVE NEGATIVE    Comment: (NOTE) Fact Sheet for Patients: BloggerCourse.com  Fact Sheet for Healthcare Providers: SeriousBroker.it  This test is not yet approved or cleared by the [place] FDA and has been authorized for detection and/or diagnosis of SARS-CoV-2 by FDA under an Emergency Use Authorization (EUA). This EUA will remain in effect (meaning this test can be used) for the duration of the COVID-19 declaration under Section 564(b)(1) of the Act, 21 U.S.C. section 360bbb-3(b)(1), unless the authorization is terminated or revoked.  Performed at [facility], [address] [place] [address], [place], [place] [address]   Troponin I (High Sensitivity)     Status: Abnormal   Collection Time: 01/21/23  8:12 PM  Result Value Ref Range   Troponin  I (High Sensitivity) 161 (HH) <18 ng/L    Comment: CRITICAL RESULT CALLED TO, READ BACK BY AND VERIFIED WITH K BELTON RN [phone] K FORSYTH (NOTE) Elevated high sensitivity troponin I (hsTnI) values and significant   changes across serial measurements may suggest ACS but many other  chronic and acute conditions are known to elevate hsTnI results.  Refer to the "Links" section for chest pain algorithms and additional  guidance. Performed at [facility], [address], [place], [place] [address]   Troponin I (High Sensitivity)     Status: Abnormal   Collection Time: 01/21/23 11:57 PM  Result Value Ref Range   Troponin I (High Sensitivity) 265 (HH) <18 ng/L    Comment: CRITICAL RESULT CALLED TO, READ BACK BY AND VERIFIED WITH C. Romina Divirgilio AT 0039 ON 11.19.24 BY ADGER J DELTA CHECK NOTED (NOTE) Elevated high sensitivity troponin I (hsTnI) values and significant  changes across serial measurements may suggest ACS but many other  chronic and acute conditions are known to elevate hsTnI results.  Refer to the "Links" section for chest pain algorithms and additional  guidance. Performed at [facility], [address], [place], [place] [address]    CT Angio Chest PE W and/or Wo Contrast  Result Date: 01/21/2023 CLINICAL DATA:  Pulmonary embolism (PE) suspected, high prob. Shortness of breath. Low O2 sats. EXAM: CT ANGIOGRAPHY CHEST WITH CONTRAST TECHNIQUE: Multidetector CT imaging of the chest was performed using the standard protocol during bolus administration of intravenous contrast. Multiplanar CT image reconstructions and MIPs were obtained to evaluate the vascular anatomy. RADIATION DOSE REDUCTION: This exam was performed according to the departmental dose-optimization program which includes automated exposure control, adjustment of the mA and/or kV according to patient size and/or use of iterative reconstruction technique. CONTRAST:  75mL OMNIPAQUE IOHEXOL 350 MG/ML SOLN COMPARISON:  10/28/2019 FINDINGS: Cardiovascular: No filling defects in the pulmonary arteries to suggest pulmonary emboli. Heart is normal size. Aorta is normal caliber. Mediastinum/Nodes: No mediastinal, hilar, or axillary adenopathy.  Trachea and esophagus are unremarkable. Thyroid unremarkable. Lungs/Pleura: Centrilobular and paraseptal emphysema. Ground-glass airspace opacities scattered in the inferior right upper lobe and left lower lobe, but most pronounced in the right middle lobe. Findings concerning for early multifocal pneumonia. No effusions. Upper Abdomen: No acute findings Musculoskeletal: Chest wall soft tissues are unremarkable. Review of the MIP images confirms the above findings. IMPRESSION: No evidence of pulmonary embolus. Patchy ground-glass opacities bilaterally, most pronounced in the right middle lobe concerning for early pneumonia. Emphysema (ICD10-J43.9). Electronically Signed   By: Charlett Nose M.D.   On: 01/21/2023 23:58   DG Chest Portable 1 View  Result Date: 01/21/2023 CLINICAL DATA:  Shortness of breath for 2 days EXAM: PORTABLE CHEST 1 VIEW COMPARISON:  11/04/2019 FINDINGS: Cardiac shadow is within normal limits. The lungs are well aerated bilaterally. No focal infiltrate or effusion is seen. No bony abnormality is noted. IMPRESSION: No active disease. Electronically Signed   By: Alcide Clever M.D.   On: 01/21/2023 21:10    Pending Labs Unresulted Labs (From admission, onward)     Start     Ordered   01/22/23 0500  CBC  Tomorrow morning,   R        01/21/23 2126   01/22/23 0500  Heparin level (unfractionated)  Tomorrow morning,   R        01/21/23 2126   01/22/23 0048  MRSA Next Gen by PCR, Nasal  Once,   R  01/22/23 0047   01/22/23 0006  Blood culture (routine x 2)  BLOOD CULTURE X 2,   R (with STAT occurrences)      01/22/23 0005            Vitals/Pain Today's Vitals   01/21/23 2132 01/21/23 2150 01/21/23 2200 01/21/23 2230  BP:   (!) 142/78 (!) 142/80  Pulse:   84 99  Resp:   19 18  Temp:    98.8 F (37.1 C)  TempSrc:    Oral  SpO2: 97% 93% 96% 98%  Weight:      Height:      PainSc:        Isolation Precautions No active isolations  Medications Medications   heparin ADULT infusion 100 units/mL (25000 units/220mL) (1,100 Units/hr Intravenous New Bag/Given 01/21/23 2148)  ipratropium-albuterol (DUONEB) 0.5-2.5 (3) MG/3ML nebulizer solution (  Canceled Entry 01/21/23 2149)  albuterol (PROVENTIL) (2.5 MG/3ML) 0.083% nebulizer solution (  Canceled Entry 01/21/23 2149)  cefTRIAXone (ROCEPHIN) 2 g in sodium chloride 0.9 % 100 mL IVPB (2 g Intravenous New Bag/Given 01/22/23 0049)  azithromycin (ZITHROMAX) 500 mg in dextrose 5 % 250 mL IVPB (has no administration in time range)  Chlorhexidine Gluconate Cloth 2 % PADS 6 each (has no administration in time range)  ipratropium-albuterol (DUONEB) 0.5-2.5 (3) MG/3ML nebulizer solution 3 mL (3 mLs Nebulization Given 01/21/23 1823)  iohexol (OMNIPAQUE) 350 MG/ML injection 75 mL (75 mLs Intravenous Contrast Given 01/21/23 2217)  heparin bolus via infusion 4,000 Units (4,000 Units Intravenous Bolus from Bag 01/21/23 2148)  ipratropium-albuterol (DUONEB) 0.5-2.5 (3) MG/3ML nebulizer solution 3 mL (3 mLs Nebulization Given 01/21/23 2150)  albuterol (PROVENTIL) (2.5 MG/3ML) 0.083% nebulizer solution 2.5 mg (2.5 mg Nebulization Given 01/21/23 2149)  buprenorphine-naloxone (SUBOXONE) 8-2 mg per SL tablet 1 tablet (1 tablet Sublingual Given 01/21/23 2241)  ondansetron (ZOFRAN) injection 4 mg (4 mg Intravenous Given 01/21/23 2358)  diphenhydrAMINE (BENADRYL) injection 25 mg (25 mg Intravenous Given 01/21/23 2358)  ketorolac (TORADOL) 15 MG/ML injection 15 mg (15 mg Intravenous Given 01/22/23 0046)    Mobility walks     Focused Assessments    R Recommendations: See Admitting Provider Note  Report given to:   Additional Notes: A&O; Ambu to bedside commode; 20G RFA; 7L HFNC

## 2023-01-22 NOTE — Discharge Instructions (Signed)

## 2023-01-22 NOTE — Plan of Care (Signed)
  Problem: Skin Integrity: Goal: Risk for impaired skin integrity will decrease Outcome: Progressing   Problem: Clinical Measurements: Goal: Ability to maintain a body temperature in the normal range will improve Outcome: Progressing   Problem: Respiratory: Goal: Ability to maintain adequate ventilation will improve Outcome: Progressing Goal: Ability to maintain a clear airway will improve Outcome: Progressing   Problem: Coping: Goal: Level of anxiety will decrease Outcome: Not Progressing

## 2023-01-22 NOTE — Progress Notes (Signed)
eLink Physician-Brief Progress Note Patient Name: LAURIANNE QUERTERMOUS DOB: 1985-10-16 MRN: [id number]   Date of Service  01/22/2023  HPI/Events of Note  37 year old female history of pulmonary embolus, gastroesophageal reflux disease, heroin use and tobacco use initially presented to the emergency department via EMS for dyspnea and to have community-acquired pneumonia and started on appropriate antibiotics.  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  On presentation she is tachypneic, tachycardic, but comfortable appearing for the time being.  Ventilatory mechanics look appropriate. Ventilation of 23 L.    eICU Interventions  For now, no intervention is indicated.  If the patient has increased agitation, could consider initiation of Precedex in addition to ongoing benzodiazepine therapy.  Given previous history of asthma, would schedule SVNs.  Fortunately, inspiratory pressures are minimal at this time.  Tenuous overall, but maintain noninvasive ventilation for the time being.   0129 -stable respiratory status  Intervention Category Minor Interventions: Clinical assessment - ordering diagnostic tests  Jacynda Brunke 01/22/2023, 8:37 PM

## 2023-01-23 DIAGNOSIS — J9601 Acute respiratory failure with hypoxia: Secondary | ICD-10-CM | POA: Diagnosis not present

## 2023-01-23 DIAGNOSIS — R9431 Abnormal electrocardiogram [ECG] [EKG]: Secondary | ICD-10-CM | POA: Diagnosis not present

## 2023-01-23 DIAGNOSIS — I5A Non-ischemic myocardial injury (non-traumatic): Secondary | ICD-10-CM | POA: Diagnosis not present

## 2023-01-23 DIAGNOSIS — F1193 Opioid use, unspecified with withdrawal: Secondary | ICD-10-CM | POA: Diagnosis not present

## 2023-01-23 DIAGNOSIS — J189 Pneumonia, unspecified organism: Secondary | ICD-10-CM | POA: Diagnosis not present

## 2023-01-23 LAB — CBC
HCT: 43.5 % (ref 36.0–46.0)
Hemoglobin: 14.8 g/dL (ref 12.0–15.0)
MCH: 30.5 pg (ref 26.0–34.0)
MCHC: 34 g/dL (ref 30.0–36.0)
MCV: 89.7 fL (ref 80.0–100.0)
Platelets: 240 10*3/uL (ref 150–400)
RBC: 4.85 MIL/uL (ref 3.87–5.11)
RDW: 13.2 % (ref 11.5–15.5)
WBC: 16.4 10*3/uL — ABNORMAL HIGH (ref 4.0–10.5)
nRBC: 0 % (ref 0.0–0.2)

## 2023-01-23 LAB — COMPREHENSIVE METABOLIC PANEL
ALT: 12 U/L (ref 0–44)
AST: 16 U/L (ref 15–41)
Albumin: 3.3 g/dL — ABNORMAL LOW (ref 3.5–5.0)
Alkaline Phosphatase: 63 U/L (ref 38–126)
Anion gap: 11 (ref 5–15)
BUN: 14 mg/dL (ref 6–20)
CO2: 21 mmol/L — ABNORMAL LOW (ref 22–32)
Calcium: 8.3 mg/dL — ABNORMAL LOW (ref 8.9–10.3)
Chloride: 102 mmol/L (ref 98–111)
Creatinine, Ser: 0.56 mg/dL (ref 0.44–1.00)
GFR, Estimated: >60 mL/min (ref >60)
Glucose, Bld: 118 mg/dL — ABNORMAL HIGH (ref 70–99)
Potassium: 3.9 mmol/L (ref 3.5–5.1)
Sodium: 134 mmol/L — ABNORMAL LOW (ref 135–145)
Total Bilirubin: 0.4 mg/dL (ref <1.2)
Total Protein: 6.4 g/dL — ABNORMAL LOW (ref 6.5–8.1)

## 2023-01-23 LAB — MAGNESIUM: Magnesium: 1.9 mg/dL (ref 1.7–2.4)

## 2023-01-23 MED ORDER — ENOXAPARIN SODIUM 40 MG/0.4ML IJ SOSY
40.0000 mg | PREFILLED_SYRINGE | INTRAMUSCULAR | Status: DC
  Administered 2023-01-23: 40 mg via SUBCUTANEOUS
  Filled 2023-01-23: qty 0.4

## 2023-01-23 MED ORDER — DEXMEDETOMIDINE HCL IN NACL 400 MCG/100ML IV SOLN
0.4000 ug/kg/h | INTRAVENOUS | Status: DC
  Administered 2023-01-23: 0.6 ug/kg/h via INTRAVENOUS
  Administered 2023-01-23: 0.4 ug/kg/h via INTRAVENOUS
  Administered 2023-01-23: 0.5 ug/kg/h via INTRAVENOUS
  Filled 2023-01-23 (×4): qty 100

## 2023-01-23 MED ORDER — LORAZEPAM 2 MG/ML IJ SOLN
2.0000 mg | INTRAMUSCULAR | Status: DC | PRN
  Administered 2023-01-23: 2 mg via INTRAVENOUS
  Filled 2023-01-23: qty 1

## 2023-01-23 MED ORDER — LACTATED RINGERS IV SOLN: INTRAVENOUS | Status: AC

## 2023-01-23 NOTE — Progress Notes (Signed)
Progress Note  Patient Name: Diane Norton Date of Encounter: 01/23/2023  Primary Cardiologist: None  Subjective   Placed on Precedex drip yesterday after which agitation improved.  No chest pain today.  Patient calmer compared to yesterday.  Inpatient Medications    Scheduled Meds:  Chlorhexidine Gluconate Cloth  6 each Topical Daily   cloNIDine  0.1 mg Transdermal Weekly   ipratropium-albuterol  3 mL Nebulization QID   methylPREDNISolone (SOLU-MEDROL) injection  40 mg Intravenous Q12H   metoCLOPramide (REGLAN) injection  10 mg Intravenous Q6H   mupirocin ointment   Nasal BID   pantoprazole (PROTONIX) IV  40 mg Intravenous Q24H   Continuous Infusions:  azithromycin Stopped (01/23/23 0021)   cefTRIAXone (ROCEPHIN)  IV Stopped (01/23/23 0981)   dexmedetomidine (PRECEDEX) IV infusion 0.6 mcg/kg/hr (01/23/23 0630)   PRN Meds: ipratropium-albuterol, labetalol, LORazepam, prochlorperazine   Vital Signs    Vitals:   01/23/23 0733 01/23/23 0755 01/23/23 0800 01/23/23 0900  BP:    109/69  Pulse:   95 95  Resp:   (!) 28 (!) 29  Temp:  98.1 F (36.7 C)    TempSrc:      SpO2: 97%  94% 96%  Weight:      Height:        Intake/Output Summary (Last 24 hours) at 01/23/2023 1029 Last data filed at 01/23/2023 0630 Gross per 24 hour  Intake 2090.67 ml  Output 950 ml  Net 1140.67 ml   Filed Weights   01/21/23 1801 01/22/23 0145  Weight: 60 kg 75.8 kg    Telemetry     Personally reviewed, normal sinus rhythm.  ECG    Not performed today.  Physical Exam   GEN: No acute distress.   Neck: No JVD. Cardiac: RRR, no murmur, rub, or gallop.  Respiratory: Nonlabored. Clear to auscultation bilaterally. GI: Soft, nontender, bowel sounds present. MS: No edema; No deformity. Neuro:  Nonfocal. Psych: Alert and oriented x 3. Normal affect.  Labs    Chemistry Recent Labs  Lab 01/21/23 1830 01/22/23 0436 01/23/23 0426  NA 136 136 134*  K 3.9 3.4* 3.9  CL 105  100 102  CO2 20* 22 21*  GLUCOSE 102* 133* 118*  BUN 6 8 14   CREATININE 0.51 0.55 0.56  CALCIUM 8.6* 9.0 8.3*  PROT 8.1 7.7 6.4*  ALBUMIN 4.1 3.8 3.3*  AST 15 16 16   ALT 14 14 12   ALKPHOS 90 84 63  BILITOT 0.4 0.4 0.4  GFRNONAA >60 >60 >60  ANIONGAP 11 14 11      Hematology Recent Labs  Lab 01/21/23 1830 01/22/23 0436 01/23/23 0426  WBC 9.6 8.6 16.4*  RBC 5.42* 5.17* 4.85  HGB 16.7* 16.0* 14.8  HCT 48.8* 46.3* 43.5  MCV 90.0 89.6 89.7  MCH 30.8 30.9 30.5  MCHC 34.2 34.6 34.0  RDW 13.0 12.9 13.2  PLT 270 270 240    Cardiac Enzymes Recent Labs  Lab 01/21/23 1830 01/21/23 2012 01/21/23 2357 01/22/23 0046  TROPONINIHS 73* 161* 265* 212*    BNPNo results for input(s): "BNP", "PROBNP" in the last 168 hours.   DDimerNo results for input(s): "DDIMER" in the last 168 hours.   Radiology    ECHOCARDIOGRAM COMPLETE  Result Date: 01/22/2023    ECHOCARDIOGRAM REPORT   Patient Name:   Diane Norton Date of Exam: 01/22/2023 Medical Rec #:  [id number]       Height:       64.0 in Accession #:  [id number]      Weight:       167.1 lb Date of Birth:  Sep 18, 1985        BSA:          1.813 m Patient Age:    37 years        BP:           139/78 mmHg Patient Gender: F               HR:           81 bpm. [facility] Procedure: 2D Echo, Cardiac Doppler and Color Doppler Indications:    NSTEMI l21.4  History:        Patient has prior history of Echocardiogram examinations, most                 recent 10/28/2019. Heroin withdrawal (HCC), Elevated troponin.  Sonographer:    Celesta Gentile RCS Referring Phys: [id number] Tarri Abernethy [facility]  Sonographer Comments: Image acquisition challenging due to respiratory motion, Image acquisition challenging due to uncooperative patient and Image acquisition challenging due to patient behavioral factors. IMPRESSIONS  1. Left ventricular ejection fraction, by estimation, is 55 to 60%. The left ventricle has normal function. Left ventricular endocardial  border not optimally defined to evaluate regional wall motion. Indeterminate diastolic filling due to E-A fusion.  2. Right ventricular systolic function is normal. The right ventricular size is normal. Tricuspid regurgitation signal is inadequate for assessing PA pressure.  3. The mitral valve is grossly normal. Mild mitral valve regurgitation. No evidence of mitral stenosis.  4. The aortic valve was not well visualized. Aortic valve regurgitation is not visualized. No aortic stenosis is present.  5. The inferior vena cava is normal in size with <50% respiratory variability, suggesting right atrial pressure of 8 mmHg. Comparison(s): Changes from prior study are noted. RV size and function are normal now. LV function is stable. FINDINGS  Left Ventricle: Left ventricular ejection fraction, by estimation, is 55 to 60%. The left ventricle has normal function. Left ventricular endocardial border not optimally defined to evaluate regional wall motion. The left ventricular internal cavity size was normal in size. There is no left ventricular hypertrophy. Indeterminate diastolic filling due to E-A fusion. Right Ventricle: The right ventricular size is normal. No increase in right ventricular wall thickness. Right ventricular systolic function is normal. Tricuspid regurgitation signal is inadequate for assessing PA pressure. Left Atrium: Left atrial size was normal in size. Right Atrium: Right atrial size was normal in size. Pericardium: There is no evidence of pericardial effusion. Mitral Valve: The mitral valve is grossly normal. Mild mitral valve regurgitation. No evidence of mitral valve stenosis. Tricuspid Valve: The tricuspid valve is not well visualized. Tricuspid valve regurgitation is not demonstrated. No evidence of tricuspid stenosis. Aortic Valve: The aortic valve was not well visualized. Aortic valve regurgitation is not visualized. No aortic stenosis is present. Pulmonic Valve: The pulmonic valve was not well  visualized. Pulmonic valve regurgitation is not visualized. No evidence of pulmonic stenosis. Aorta: The aortic root is normal in size and structure. Venous: The inferior vena cava is normal in size with less than 50% respiratory variability, suggesting right atrial pressure of 8 mmHg. IAS/Shunts: The interatrial septum was not well visualized.  LEFT VENTRICLE PLAX 2D LVIDd:         4.90 cm   Diastology LVIDs:         3.50 cm   LV e' lateral:  14.40 cm/s LV PW:         1.00 cm   LV E/e' lateral: 8.8 LV IVS:        0.80 cm LVOT diam:     1.90 cm LV SV:         58 LV SV Index:   32 LVOT Area:     2.84 cm  RIGHT VENTRICLE TAPSE (M-mode): 2.8 cm LEFT ATRIUM             Index        RIGHT ATRIUM          Index LA diam:        3.10 cm 1.71 cm/m   RA Area:     9.27 cm LA Vol (A2C):   37.3 ml 20.58 ml/m  RA Volume:   16.70 ml 9.21 ml/m LA Vol (A4C):   26.2 ml 14.45 ml/m LA Biplane Vol: 34.2 ml 18.87 ml/m  AORTIC VALVE LVOT Vmax:   109.00 cm/s LVOT Vmean:  68.600 cm/s LVOT VTI:    0.205 m  AORTA Ao Root diam: 3.20 cm MITRAL VALVE MV Area (PHT): 3.99 cm     SHUNTS MV Decel Time: 190 msec     Systemic VTI:  0.20 m MV E velocity: 127.00 cm/s  Systemic Diam: 1.90 cm Dereona Kolodny Priya Chiante Peden Electronically signed by Winfield Rast Paulina Muchmore Signature Date/Time: 01/22/2023/4:33:33 PM    Final    CT Angio Chest PE W and/or Wo Contrast  Result Date: 01/21/2023 CLINICAL DATA:  Pulmonary embolism (PE) suspected, high prob. Shortness of breath. Low O2 sats. EXAM: CT ANGIOGRAPHY CHEST WITH CONTRAST TECHNIQUE: Multidetector CT imaging of the chest was performed using the standard protocol during bolus administration of intravenous contrast. Multiplanar CT image reconstructions and MIPs were obtained to evaluate the vascular anatomy. RADIATION DOSE REDUCTION: This exam was performed according to the departmental dose-optimization program which includes automated exposure control, adjustment of the mA and/or kV according to  patient size and/or use of iterative reconstruction technique. CONTRAST:  75mL OMNIPAQUE IOHEXOL 350 MG/ML SOLN COMPARISON:  10/28/2019 FINDINGS: Cardiovascular: No filling defects in the pulmonary arteries to suggest pulmonary emboli. Heart is normal size. Aorta is normal caliber. Mediastinum/Nodes: No mediastinal, hilar, or axillary adenopathy. Trachea and esophagus are unremarkable. Thyroid unremarkable. Lungs/Pleura: Centrilobular and paraseptal emphysema. Ground-glass airspace opacities scattered in the inferior right upper lobe and left lower lobe, but most pronounced in the right middle lobe. Findings concerning for early multifocal pneumonia. No effusions. Upper Abdomen: No acute findings Musculoskeletal: Chest wall soft tissues are unremarkable. Review of the MIP images confirms the above findings. IMPRESSION: No evidence of pulmonary embolus. Patchy ground-glass opacities bilaterally, most pronounced in the right middle lobe concerning for early pneumonia. Emphysema (ICD10-J43.9). Electronically Signed   By: Charlett Nose M.D.   On: 01/21/2023 23:58   DG Chest Portable 1 View  Result Date: 01/21/2023 CLINICAL DATA:  Shortness of breath for 2 days EXAM: PORTABLE CHEST 1 VIEW COMPARISON:  11/04/2019 FINDINGS: Cardiac shadow is within normal limits. The lungs are well aerated bilaterally. No focal infiltrate or effusion is seen. No bony abnormality is noted. IMPRESSION: No active disease. Electronically Signed   By: Alcide Clever M.D.   On: 01/21/2023 21:10     Assessment & Plan   Acute hypoxic respiratory failure secondary to multifocal pneumonia (4L [place] O2) Heroin withdrawal NSTEMI type II/myocardial injury from demand ischemia HTN, controlled   -Presented with sudden onset of chest pain and SOB, CTPE negative  for PE but showed multiple infiltrates in bilateral lungs consistent with early multifocal pneumonia, on nonrebreather mask that was switched to BiPAP yesterday and currently on 4L [place]  oxygen. Had to place her on Precedex drip for heroin withdrawal and currently not agitated.  She denies having chest pain today. Hs troponins mildly elevated, 73>>161>>212>>265 likely secondary demand ischemia from pneumonia.  Echocardiogram on admission showed normal LVEF and mild MR.   [facility] will sign off.   Medication Recommendations: Continue home medications Other recommendations (labs, testing, etc): None Follow up as an outpatient: No need of cardiology follow-up, get follow-up with PCP and consider outpatient CT coronary calcium scoring test.    Signed, Sebastian Lurz P Rashi Granier, MD  01/23/2023, 10:29 AM

## 2023-01-23 NOTE — Hospital Course (Addendum)
37 y.o. female with medical history significant of PE, GERD, heroin abuse, tobacco abuse who presents to the emergency department from home via EMS due to shortness of breath which started yesterday and was associated with chest pain which was prescribed as pressure-like and Holts with deep breath.  She complained of several days of upper respiratory tract infection symptoms including nasal congestion, sore throat, productive cough which has resulted in her smoking less since she was not feeling good.   ED Course:  In the emergency department, he was hemodynamically stable.  Workup in the ED showed normocytic anemia, BMP was normal except for bicarb of 20 and blood glucose of 102, calcium 8.6.  Troponin 161 > 73 > 265 > 212.  Influenza A, B, SARS coronavirus 2, RSV was negative.  Blood culture pending. Chest x-ray showed no active disease.  CT angiography chest with contrast showed no evidence of pulmonary embolus.  Patchy groundglass opacities bilaterally, most pronounced in the right middle lobe concerning for early pneumonia.  She was treated with ceftriaxone and azithromycin.  Present treatment was provided, Suboxone and Benadryl were given due to withdrawal from heroin.  Cardiologist on-call was consulted and recommended admitting the patient here at [facility] with plan for cardiology to consult on patient in the morning per ED PA.  Hospitalist was asked to admit patient for further evaluation and management.

## 2023-01-23 NOTE — Progress Notes (Signed)
PROGRESS NOTE   Diane Norton  KVQ:[id number] DOB: 10-29-1985 DOA: 01/21/2023 PCP: Patient, No Pcp Per   Chief Complaint  Patient presents with   Shortness of Breath   Level of care: ICU  Brief Admission History:  37 y.o. female with medical history significant of PE, GERD, heroin abuse, tobacco abuse who presents to the emergency department from home via EMS due to shortness of breath which started yesterday and was associated with chest pain which was prescribed as pressure-like and Holts with deep breath.  She complained of several days of upper respiratory tract infection symptoms including nasal congestion, sore throat, productive cough which has resulted in her smoking less since she was not feeling good.   ED Course:  In the emergency department, he was hemodynamically stable.  Workup in the ED showed normocytic anemia, BMP was normal except for bicarb of 20 and blood glucose of 102, calcium 8.6.  Troponin 161 > 73 > 265 > 212.  Influenza A, B, SARS coronavirus 2, RSV was negative.  Blood culture pending. Chest x-ray showed no active disease.  CT angiography chest with contrast showed no evidence of pulmonary embolus.  Patchy groundglass opacities bilaterally, most pronounced in the right middle lobe concerning for early pneumonia.  She was treated with ceftriaxone and azithromycin.  Present treatment was provided, Suboxone and Benadryl were given due to withdrawal from heroin.  Cardiologist on-call was consulted and recommended admitting the patient here at [facility] with plan for cardiology to consult on patient in the morning per ED PA.  Hospitalist was asked to admit patient for further evaluation and management.   Assessment and Plan:  CAP  Patient was started on ceftriaxone and azithromycin which will continue  Sputum culture, urine Legionella, strep pneumo still pending Continue Tylenol as needed Continue Mucinex, incentive spirometry, flutter valve    Elevated troponin, ruled out  NSTEMI Troponin 161 > 73 > 265 > 212  Patient was started on heparin drip but now discontinued Cardiology was consulted and felt this was demand ischemia   Heroin withdrawal Continue IV precedex infusion due to severe withdrawal  Continue precedex with IV lorazepam   Prolonged QT interval QTc 502 ms Avoid QT prolonging drugs Magnesium level will be checked Repeat EKG pending   Hypocalcemia Calcium corrected to 8.86, continue Os-Cal   History of pulmonary embolism She is reportedly off apixaban now.   GERD Continue Protonix   DVT prophylaxis: enoxparin  Code Status: Full  Family Communication:  Disposition: anticipate home    Consultants:  cardiology Procedures:   Antimicrobials:   Ceftriaxone 11/19>>  Azithromycin 11/19>>  Subjective: Pt is sedated on room air at this time, much less agitated on precedex infusion.  Objective: Vitals:   01/23/23 1200 01/23/23 1300 01/23/23 1400 01/23/23 1406  BP: (!) 94/51   100/76  Pulse: 93 85 86 88  Resp: (!) 23 (!) 27 (!) 25 (!) 25  Temp: 97.8 F (36.6 C)     TempSrc: Oral     SpO2: 95% 96% 98% 98%  Weight:      Height:        Intake/Output Summary (Last 24 hours) at 01/23/2023 1705 Last data filed at 01/23/2023 1226 Gross per 24 hour  Intake 2090.67 ml  Output 950 ml  Net 1140.67 ml   Filed Weights   01/21/23 1801 01/22/23 0145  Weight: 60 kg 75.8 kg   Examination:  General exam: sedated on IV precedex infusion   Respiratory system: bilateral rales, moderate  increased work of breathing.  Cardiovascular system: normal S1 & S2 heard. No JVD, murmurs, rubs, gallops or clicks. No pedal edema. Gastrointestinal system: Abdomen is nondistended, soft and nontender. No organomegaly or masses felt. Normal bowel sounds heard. Central nervous system: Alert and oriented. No focal neurological deficits. Extremities: Symmetric 5 x 5 power. Skin: No rashes, lesions or ulcers. Psychiatry: Judgement and insight appear UTD.  Mood & affect sedated on precedex.   Data Reviewed: I have personally reviewed following labs and imaging studies  CBC: Recent Labs  Lab 01/21/23 1830 01/22/23 0436 01/23/23 0426  WBC 9.6 8.6 16.4*  NEUTROABS 7.5  --   --   HGB 16.7* 16.0* 14.8  HCT 48.8* 46.3* 43.5  MCV 90.0 89.6 89.7  PLT 270 270 240    Basic Metabolic Panel: Recent Labs  Lab 01/21/23 1830 01/22/23 0436 01/23/23 0426  NA 136 136 134*  K 3.9 3.4* 3.9  CL 105 100 102  CO2 20* 22 21*  GLUCOSE 102* 133* 118*  BUN 6 8 14   CREATININE 0.51 0.55 0.56  CALCIUM 8.6* 9.0 8.3*  MG  --  1.9 1.9  PHOS  --  2.6  --     CBG: No results for input(s): "GLUCAP" in the last 168 hours.  Recent Results (from the past 240 hour(s))  Resp panel by RT-PCR (RSV, Flu A&B, Covid) Anterior Nasal Swab     Status: None   Collection Time: 01/21/23  7:41 PM   Specimen: Anterior Nasal Swab  Result Value Ref Range Status   SARS Coronavirus 2 by RT PCR NEGATIVE NEGATIVE Final    Comment: (NOTE) SARS-CoV-2 target nucleic acids are NOT DETECTED.  The SARS-CoV-2 RNA is generally detectable in upper respiratory specimens during the acute phase of infection. The lowest concentration of SARS-CoV-2 viral copies this assay can detect is 138 copies/mL. A negative result does not preclude SARS-Cov-2 infection and should not be used as the sole basis for treatment or other patient management decisions. A negative result may occur with  improper specimen collection/handling, submission of specimen other than nasopharyngeal swab, presence of viral mutation(s) within the areas targeted by this assay, and inadequate number of viral copies(<138 copies/mL). A negative result must be combined with clinical observations, patient history, and epidemiological information. The expected result is Negative.  Fact Sheet for Patients:  BloggerCourse.com  Fact Sheet for Healthcare Providers:   SeriousBroker.it  This test is no t yet approved or cleared by the [place] FDA and  has been authorized for detection and/or diagnosis of SARS-CoV-2 by FDA under an Emergency Use Authorization (EUA). This EUA will remain  in effect (meaning this test can be used) for the duration of the COVID-19 declaration under Section 564(b)(1) of the Act, 21 U.S.C.section 360bbb-3(b)(1), unless the authorization is terminated  or revoked sooner.       Influenza A by PCR NEGATIVE NEGATIVE Final   Influenza B by PCR NEGATIVE NEGATIVE Final    Comment: (NOTE) The Xpert Xpress SARS-CoV-2/FLU/RSV plus assay is intended as an aid in the diagnosis of influenza from Nasopharyngeal swab specimens and should not be used as a sole basis for treatment. Nasal washings and aspirates are unacceptable for Xpert Xpress SARS-CoV-2/FLU/RSV testing.  Fact Sheet for Patients: BloggerCourse.com  Fact Sheet for Healthcare Providers: SeriousBroker.it  This test is not yet approved or cleared by the [place] FDA and has been authorized for detection and/or diagnosis of SARS-CoV-2 by FDA under an Emergency Use Authorization (EUA). This  EUA will remain in effect (meaning this test can be used) for the duration of the COVID-19 declaration under Section 564(b)(1) of the Act, 21 U.S.C. section 360bbb-3(b)(1), unless the authorization is terminated or revoked.     Resp Syncytial Virus by PCR NEGATIVE NEGATIVE Final    Comment: (NOTE) Fact Sheet for Patients: BloggerCourse.com  Fact Sheet for Healthcare Providers: SeriousBroker.it  This test is not yet approved or cleared by the [place] FDA and has been authorized for detection and/or diagnosis of SARS-CoV-2 by FDA under an Emergency Use Authorization (EUA). This EUA will remain in effect (meaning this test can be used) for  the duration of the COVID-19 declaration under Section 564(b)(1) of the Act, 21 U.S.C. section 360bbb-3(b)(1), unless the authorization is terminated or revoked.  Performed at [facility], [address], [place], [place] [address]   Blood culture (routine x 2)     Status: None (Preliminary result)   Collection Time: 01/22/23 12:36 AM   Specimen: BLOOD  Result Value Ref Range Status   Specimen Description BLOOD BLOOD LEFT ARM  Final   Special Requests   Final    BOTTLES DRAWN AEROBIC AND ANAEROBIC Blood Culture adequate volume   Culture   Final    NO GROWTH 1 DAY Performed at [facility], [address] [place], [place] [address]    Report Status PENDING  Incomplete  Blood culture (routine x 2)     Status: None (Preliminary result)   Collection Time: 01/22/23 12:46 AM   Specimen: BLOOD  Result Value Ref Range Status   Specimen Description BLOOD BLOOD RIGHT HAND  Final   Special Requests   Final    BOTTLES DRAWN AEROBIC AND ANAEROBIC Blood Culture adequate volume   Culture   Final    NO GROWTH 1 DAY Performed at [facility], [address] [place], [place] [address]    Report Status PENDING  Incomplete  MRSA Next Gen by PCR, Nasal     Status: Abnormal   Collection Time: 01/22/23  1:45 AM   Specimen: Nasal Mucosa; Nasal Swab  Result Value Ref Range Status   MRSA by PCR Next Gen DETECTED (A) NOT DETECTED Final    Comment: RESULT CALLED TO, READ BACK BY AND VERIFIED WITH: CASSIDY MCBRAD @ 1040 ON 01/22/23 C VARNER (NOTE) The GeneXpert MRSA Assay (FDA approved for NASAL specimens only), is one component of a comprehensive MRSA colonization surveillance program. It is not intended to diagnose MRSA infection nor to guide or monitor treatment for MRSA infections. Test performance is not FDA approved in patients less than 71 years old. Performed at [facility], [address] [place], [place] [address]      Radiology Studies: ECHOCARDIOGRAM COMPLETE  Result Date:  01/22/2023    ECHOCARDIOGRAM REPORT   Patient Name:   Diane Norton Date of Exam: 01/22/2023 Medical Rec #:  [id number]       Height:       64.0 in Accession #:    [id number]      Weight:       167.1 lb Date of Birth:  Apr 14, 1985        BSA:          1.813 m Patient Age:    37 years        BP:           139/78 mmHg Patient Gender: F               HR:  81 bpm. [facility] Procedure: 2D Echo, Cardiac Doppler and Color Doppler Indications:    NSTEMI l21.4  History:        Patient has prior history of Echocardiogram examinations, most                 recent 10/28/2019. Heroin withdrawal (HCC), Elevated troponin.  Sonographer:    Celesta Gentile RCS Referring Phys: [id number] Tarri Abernethy [facility]  Sonographer Comments: Image acquisition challenging due to respiratory motion, Image acquisition challenging due to uncooperative patient and Image acquisition challenging due to patient behavioral factors. IMPRESSIONS  1. Left ventricular ejection fraction, by estimation, is 55 to 60%. The left ventricle has normal function. Left ventricular endocardial border not optimally defined to evaluate regional wall motion. Indeterminate diastolic filling due to E-A fusion.  2. Right ventricular systolic function is normal. The right ventricular size is normal. Tricuspid regurgitation signal is inadequate for assessing PA pressure.  3. The mitral valve is grossly normal. Mild mitral valve regurgitation. No evidence of mitral stenosis.  4. The aortic valve was not well visualized. Aortic valve regurgitation is not visualized. No aortic stenosis is present.  5. The inferior vena cava is normal in size with <50% respiratory variability, suggesting right atrial pressure of 8 mmHg. Comparison(s): Changes from prior study are noted. RV size and function are normal now. LV function is stable. FINDINGS  Left Ventricle: Left ventricular ejection fraction, by estimation, is 55 to 60%. The left ventricle has normal function. Left  ventricular endocardial border not optimally defined to evaluate regional wall motion. The left ventricular internal cavity size was normal in size. There is no left ventricular hypertrophy. Indeterminate diastolic filling due to E-A fusion. Right Ventricle: The right ventricular size is normal. No increase in right ventricular wall thickness. Right ventricular systolic function is normal. Tricuspid regurgitation signal is inadequate for assessing PA pressure. Left Atrium: Left atrial size was normal in size. Right Atrium: Right atrial size was normal in size. Pericardium: There is no evidence of pericardial effusion. Mitral Valve: The mitral valve is grossly normal. Mild mitral valve regurgitation. No evidence of mitral valve stenosis. Tricuspid Valve: The tricuspid valve is not well visualized. Tricuspid valve regurgitation is not demonstrated. No evidence of tricuspid stenosis. Aortic Valve: The aortic valve was not well visualized. Aortic valve regurgitation is not visualized. No aortic stenosis is present. Pulmonic Valve: The pulmonic valve was not well visualized. Pulmonic valve regurgitation is not visualized. No evidence of pulmonic stenosis. Aorta: The aortic root is normal in size and structure. Venous: The inferior vena cava is normal in size with less than 50% respiratory variability, suggesting right atrial pressure of 8 mmHg. IAS/Shunts: The interatrial septum was not well visualized.  LEFT VENTRICLE PLAX 2D LVIDd:         4.90 cm   Diastology LVIDs:         3.50 cm   LV e' lateral:   14.40 cm/s LV PW:         1.00 cm   LV E/e' lateral: 8.8 LV IVS:        0.80 cm LVOT diam:     1.90 cm LV SV:         58 LV SV Index:   32 LVOT Area:     2.84 cm  RIGHT VENTRICLE TAPSE (M-mode): 2.8 cm LEFT ATRIUM             Index        RIGHT ATRIUM  Index LA diam:        3.10 cm 1.71 cm/m   RA Area:     9.27 cm LA Vol (A2C):   37.3 ml 20.58 ml/m  RA Volume:   16.70 ml 9.21 ml/m LA Vol (A4C):   26.2 ml  14.45 ml/m LA Biplane Vol: 34.2 ml 18.87 ml/m  AORTIC VALVE LVOT Vmax:   109.00 cm/s LVOT Vmean:  68.600 cm/s LVOT VTI:    0.205 m  AORTA Ao Root diam: 3.20 cm MITRAL VALVE MV Area (PHT): 3.99 cm     SHUNTS MV Decel Time: 190 msec     Systemic VTI:  0.20 m MV E velocity: 127.00 cm/s  Systemic Diam: 1.90 cm Vishnu Priya Mallipeddi Electronically signed by Winfield Rast Mallipeddi Signature Date/Time: 01/22/2023/4:33:33 PM    Final    CT Angio Chest PE W and/or Wo Contrast  Result Date: 01/21/2023 CLINICAL DATA:  Pulmonary embolism (PE) suspected, high prob. Shortness of breath. Low O2 sats. EXAM: CT ANGIOGRAPHY CHEST WITH CONTRAST TECHNIQUE: Multidetector CT imaging of the chest was performed using the standard protocol during bolus administration of intravenous contrast. Multiplanar CT image reconstructions and MIPs were obtained to evaluate the vascular anatomy. RADIATION DOSE REDUCTION: This exam was performed according to the departmental dose-optimization program which includes automated exposure control, adjustment of the mA and/or kV according to patient size and/or use of iterative reconstruction technique. CONTRAST:  75mL OMNIPAQUE IOHEXOL 350 MG/ML SOLN COMPARISON:  10/28/2019 FINDINGS: Cardiovascular: No filling defects in the pulmonary arteries to suggest pulmonary emboli. Heart is normal size. Aorta is normal caliber. Mediastinum/Nodes: No mediastinal, hilar, or axillary adenopathy. Trachea and esophagus are unremarkable. Thyroid unremarkable. Lungs/Pleura: Centrilobular and paraseptal emphysema. Ground-glass airspace opacities scattered in the inferior right upper lobe and left lower lobe, but most pronounced in the right middle lobe. Findings concerning for early multifocal pneumonia. No effusions. Upper Abdomen: No acute findings Musculoskeletal: Chest wall soft tissues are unremarkable. Review of the MIP images confirms the above findings. IMPRESSION: No evidence of pulmonary embolus. Patchy  ground-glass opacities bilaterally, most pronounced in the right middle lobe concerning for early pneumonia. Emphysema (ICD10-J43.9). Electronically Signed   By: Charlett Nose M.D.   On: 01/21/2023 23:58   DG Chest Portable 1 View  Result Date: 01/21/2023 CLINICAL DATA:  Shortness of breath for 2 days EXAM: PORTABLE CHEST 1 VIEW COMPARISON:  11/04/2019 FINDINGS: Cardiac shadow is within normal limits. The lungs are well aerated bilaterally. No focal infiltrate or effusion is seen. No bony abnormality is noted. IMPRESSION: No active disease. Electronically Signed   By: Alcide Clever M.D.   On: 01/21/2023 21:10    Scheduled Meds:  Chlorhexidine Gluconate Cloth  6 each Topical Norton   ipratropium-albuterol  3 mL Nebulization QID   methylPREDNISolone (SOLU-MEDROL) injection  40 mg Intravenous Q12H   metoCLOPramide (REGLAN) injection  10 mg Intravenous Q6H   mupirocin ointment   Nasal BID   pantoprazole (PROTONIX) IV  40 mg Intravenous Q24H   Continuous Infusions:  azithromycin Stopped (01/23/23 0021)   cefTRIAXone (ROCEPHIN)  IV Stopped (01/23/23 0212)   dexmedetomidine (PRECEDEX) IV infusion 0.6 mcg/kg/hr (01/23/23 1348)   lactated ringers 75 mL/hr at 01/23/23 1346     LOS: 1 day   Critical Care Procedure Note Authorized and Performed by: Maryln Manuel MD  Total Critical Care time:  58 mins Due to a high probability of clinically significant, life threatening deterioration, the patient required my highest level of preparedness to intervene emergently  and I personally spent this critical care time directly and personally managing the patient.  This critical care time included obtaining a history; examining the patient, pulse oximetry; ordering and review of studies; arranging urgent treatment with development of a management plan; evaluation of patient's response of treatment; frequent reassessment; and discussions with other providers.  This critical care time was performed to assess and manage  the high probability of imminent and life threatening deterioration that could result in multi-organ failure.  It was exclusive of separately billable procedures and treating other patients and teaching time.    Standley Dakins, MD How to contact the [facility] Attending or Consulting provider 7A - 7P or covering provider during after hours 7P -7A, for this patient?  Check the care team in [facility] and look for a) attending/consulting [facility] provider listed and b) the [facility] team listed Log into [email or web address] to find provider on call.  Locate the [facility] provider you are looking for under [facility] and page to a number that you can be directly reached. If you still have difficulty reaching the provider, please page the [facility] (Director on Call) for the Hospitalists listed on amion for assistance.  01/23/2023, 5:05 PM

## 2023-01-24 ENCOUNTER — Inpatient Hospital Stay (HOSPITAL_COMMUNITY): Payer: Medicaid Other

## 2023-01-24 DIAGNOSIS — F1193 Opioid use, unspecified with withdrawal: Secondary | ICD-10-CM | POA: Diagnosis not present

## 2023-01-24 DIAGNOSIS — R7989 Other specified abnormal findings of blood chemistry: Secondary | ICD-10-CM | POA: Diagnosis not present

## 2023-01-24 DIAGNOSIS — J9601 Acute respiratory failure with hypoxia: Secondary | ICD-10-CM

## 2023-01-24 DIAGNOSIS — J189 Pneumonia, unspecified organism: Secondary | ICD-10-CM | POA: Diagnosis not present

## 2023-01-24 LAB — COMPREHENSIVE METABOLIC PANEL
ALT: 6 U/L (ref 0–44)
AST: 7 U/L — ABNORMAL LOW (ref 15–41)
Albumin: 3.1 g/dL — ABNORMAL LOW (ref 3.5–5.0)
Alkaline Phosphatase: 59 U/L (ref 38–126)
Anion gap: 11 (ref 5–15)
BUN: 17 mg/dL (ref 6–20)
CO2: 21 mmol/L — ABNORMAL LOW (ref 22–32)
Calcium: 8.4 mg/dL — ABNORMAL LOW (ref 8.9–10.3)
Chloride: 105 mmol/L (ref 98–111)
Creatinine, Ser: 0.57 mg/dL (ref 0.44–1.00)
GFR, Estimated: >60 mL/min (ref >60)
Glucose, Bld: 118 mg/dL — ABNORMAL HIGH (ref 70–99)
Potassium: 4.2 mmol/L (ref 3.5–5.1)
Sodium: 137 mmol/L (ref 135–145)
Total Bilirubin: 0.4 mg/dL (ref <1.2)
Total Protein: 6.2 g/dL — ABNORMAL LOW (ref 6.5–8.1)

## 2023-01-24 MED ORDER — PREDNISONE 20 MG PO TABS: 20.0000 mg | ORAL_TABLET | Freq: Every day | ORAL | Status: DC

## 2023-01-24 MED ORDER — AZITHROMYCIN 250 MG PO TABS: 500.0000 mg | ORAL_TABLET | Freq: Every day | ORAL | Status: DC

## 2023-01-24 MED ORDER — BUPRENORPHINE HCL-NALOXONE HCL 8-2 MG SL SUBL: 1.0000 | SUBLINGUAL_TABLET | Freq: Two times a day (BID) | SUBLINGUAL | Status: DC

## 2023-01-24 MED ORDER — ACETAMINOPHEN 325 MG PO TABS
650.0000 mg | ORAL_TABLET | ORAL | Status: DC | PRN
  Administered 2023-01-24: 650 mg via ORAL
  Filled 2023-01-24: qty 2

## 2023-01-24 MED ORDER — BUPRENORPHINE HCL-NALOXONE HCL 2-0.5 MG SL SUBL: 2.0000 | SUBLINGUAL_TABLET | SUBLINGUAL | Status: DC | PRN

## 2023-01-24 MED ORDER — PANTOPRAZOLE SODIUM 40 MG PO TBEC
40.0000 mg | DELAYED_RELEASE_TABLET | Freq: Every day | ORAL | Status: DC
  Administered 2023-01-24: 40 mg via ORAL
  Filled 2023-01-24: qty 1

## 2023-01-24 MED ORDER — METOCLOPRAMIDE HCL 5 MG/ML IJ SOLN: 10.0000 mg | Freq: Three times a day (TID) | INTRAMUSCULAR | Status: DC | PRN

## 2023-01-24 MED ORDER — LORAZEPAM 2 MG/ML IJ SOLN
1.0000 mg | INTRAMUSCULAR | Status: DC | PRN
  Administered 2023-01-24: 1 mg via INTRAVENOUS
  Filled 2023-01-24: qty 1

## 2023-01-24 MED ORDER — IPRATROPIUM-ALBUTEROL 0.5-2.5 (3) MG/3ML IN SOLN
3.0000 mL | Freq: Three times a day (TID) | RESPIRATORY_TRACT | Status: DC
  Administered 2023-01-24: 3 mL via RESPIRATORY_TRACT
  Filled 2023-01-24: qty 3

## 2023-01-24 MED ORDER — BUPRENORPHINE HCL-NALOXONE HCL 8-2 MG SL SUBL
1.0000 | SUBLINGUAL_TABLET | Freq: Two times a day (BID) | SUBLINGUAL | Status: DC
Start: 2023-01-24 — End: 2023-01-25
  Administered 2023-01-24: 1 via SUBLINGUAL
  Filled 2023-01-24: qty 1

## 2023-01-24 NOTE — Discharge Summary (Signed)
Physician Discharge Summary  Diane Norton:[id number] DOB: June 17, 1985 DOA: 01/21/2023  PCP: Patient, No Pcp Per  Admit date: 01/21/2023 Discharge date: 01/24/2023  Disposition:  HOME  Recommendations for Outpatient Follow-up:  Follow up with PCP in 1-2 days   PATIENT DISCHARGED AGAINST MEDICAL ADVICE PATIENT CAUGHT SMOKING IN HOSPITAL ROOM PATIENT REFUSED TO STAY FURTHER FOR MEDICAL TREATMENTS  Discharge Condition: GUARDED  CODE STATUS: FULL DIET; REGULAR   Brief Hospitalization Summary: Please see all hospital notes, images, labs for full details of the hospitalization. 37 y.o. female with medical history significant of PE, GERD, heroin abuse, tobacco abuse who presents to the emergency department from home via EMS due to shortness of breath which started yesterday and was associated with chest pain which was prescribed as pressure-like and Holts with deep breath.  She complained of several days of upper respiratory tract infection symptoms including nasal congestion, sore throat, productive cough which has resulted in her smoking less since she was not feeling good.   ED Course:  In the emergency department, he was hemodynamically stable.  Workup in the ED showed normocytic anemia, BMP was normal except for bicarb of 20 and blood glucose of 102, calcium 8.6.  Troponin 161 > 73 > 265 > 212.  Influenza A, B, SARS coronavirus 2, RSV was negative.  Blood culture pending. Chest x-ray showed no active disease.  CT angiography chest with contrast showed no evidence of pulmonary embolus.  Patchy groundglass opacities bilaterally, most pronounced in the right middle lobe concerning for early pneumonia.  She was treated with ceftriaxone and azithromycin.  Present treatment was provided, Suboxone and Benadryl were given due to withdrawal from heroin.  Cardiologist on-call was consulted and recommended admitting the patient here at [facility] with plan for cardiology to consult on patient in the morning  per ED PA.  Hospitalist was asked to admit patient for further evaluation and management.  HOSPITAL COURSE   Pt was being treated for acute respiratory failure with hypoxia with new oxygen requirement and multifocal pneumonia in addition to severe heroine withdrawal.  She was in ICU on IV precedex infusion for severe withdrawal symptoms, finally weaned off precedex, we were working on weaning off supplemental oxygen and discovered patient smoking in hospital room. She then decided to leave against medical advice despite being counseled on risks.  Pt has full decisional capacity in my assessment and she discharged against medical advice. Pt advised to return to ED to seek care if her condition deteriorates or she decompensates.  Discharge Diagnoses:  Principal Problem:   CAP (community acquired pneumonia) Active Problems:   Elevated troponin   Heroin withdrawal (HCC)   Prolonged QT interval   Hypocalcemia   History of pulmonary embolism   GERD (gastroesophageal reflux disease)   Discharge Instructions:    Allergies  Allergen Reactions   Bactrim [Sulfamethoxazole-Trimethoprim] Nausea And Vomiting   Darvocet [Propoxyphene N-Acetaminophen] Hives   Other Other (See Comments)    darvocet   Penicillins Hives and Itching   Bactrim [Sulfamethoxazole-Trimethoprim] Rash   Ciprofloxacin Nausea And Vomiting and Rash     Procedures/Studies: DG CHEST PORT 1 VIEW  Result Date: 01/24/2023 CLINICAL DATA:  Multifocal pneumonia. EXAM: PORTABLE CHEST 1 VIEW COMPARISON:  January 21, 2023. FINDINGS: The heart size and mediastinal contours are within normal limits. Both lungs are clear. The visualized skeletal structures are unremarkable. IMPRESSION: No active disease. Electronically Signed   By: Lupita Raider M.D.   On: 01/24/2023 12:45   ECHOCARDIOGRAM COMPLETE  Result Date: 01/22/2023    ECHOCARDIOGRAM REPORT   Patient Name:   Diane Norton Date of Exam: 01/22/2023 Medical Rec #:   [id number]       Height:       64.0 in Accession #:    [id number]      Weight:       167.1 lb Date of Birth:  07/01/1985        BSA:          1.813 m Patient Age:    37 years        BP:           139/78 mmHg Patient Gender: F               HR:           81 bpm. [facility] Procedure: 2D Echo, Cardiac Doppler and Color Doppler Indications:    NSTEMI l21.4  History:        Patient has prior history of Echocardiogram examinations, most                 recent 10/28/2019. Heroin withdrawal (HCC), Elevated troponin.  Sonographer:    Celesta Gentile RCS Referring Phys: [id number] Tarri Abernethy [facility]  Sonographer Comments: Image acquisition challenging due to respiratory motion, Image acquisition challenging due to uncooperative patient and Image acquisition challenging due to patient behavioral factors. IMPRESSIONS  1. Left ventricular ejection fraction, by estimation, is 55 to 60%. The left ventricle has normal function. Left ventricular endocardial border not optimally defined to evaluate regional wall motion. Indeterminate diastolic filling due to E-A fusion.  2. Right ventricular systolic function is normal. The right ventricular size is normal. Tricuspid regurgitation signal is inadequate for assessing PA pressure.  3. The mitral valve is grossly normal. Mild mitral valve regurgitation. No evidence of mitral stenosis.  4. The aortic valve was not well visualized. Aortic valve regurgitation is not visualized. No aortic stenosis is present.  5. The inferior vena cava is normal in size with <50% respiratory variability, suggesting right atrial pressure of 8 mmHg. Comparison(s): Changes from prior study are noted. RV size and function are normal now. LV function is stable. FINDINGS  Left Ventricle: Left ventricular ejection fraction, by estimation, is 55 to 60%. The left ventricle has normal function. Left ventricular endocardial border not optimally defined to evaluate regional wall motion. The left ventricular internal  cavity size was normal in size. There is no left ventricular hypertrophy. Indeterminate diastolic filling due to E-A fusion. Right Ventricle: The right ventricular size is normal. No increase in right ventricular wall thickness. Right ventricular systolic function is normal. Tricuspid regurgitation signal is inadequate for assessing PA pressure. Left Atrium: Left atrial size was normal in size. Right Atrium: Right atrial size was normal in size. Pericardium: There is no evidence of pericardial effusion. Mitral Valve: The mitral valve is grossly normal. Mild mitral valve regurgitation. No evidence of mitral valve stenosis. Tricuspid Valve: The tricuspid valve is not well visualized. Tricuspid valve regurgitation is not demonstrated. No evidence of tricuspid stenosis. Aortic Valve: The aortic valve was not well visualized. Aortic valve regurgitation is not visualized. No aortic stenosis is present. Pulmonic Valve: The pulmonic valve was not well visualized. Pulmonic valve regurgitation is not visualized. No evidence of pulmonic stenosis. Aorta: The aortic root is normal in size and structure. Venous: The inferior vena cava is normal in size with less than 50% respiratory variability, suggesting right atrial pressure of 8  mmHg. IAS/Shunts: The interatrial septum was not well visualized.  LEFT VENTRICLE PLAX 2D LVIDd:         4.90 cm   Diastology LVIDs:         3.50 cm   LV e' lateral:   14.40 cm/s LV PW:         1.00 cm   LV E/e' lateral: 8.8 LV IVS:        0.80 cm LVOT diam:     1.90 cm LV SV:         58 LV SV Index:   32 LVOT Area:     2.84 cm  RIGHT VENTRICLE TAPSE (M-mode): 2.8 cm LEFT ATRIUM             Index        RIGHT ATRIUM          Index LA diam:        3.10 cm 1.71 cm/m   RA Area:     9.27 cm LA Vol (A2C):   37.3 ml 20.58 ml/m  RA Volume:   16.70 ml 9.21 ml/m LA Vol (A4C):   26.2 ml 14.45 ml/m LA Biplane Vol: 34.2 ml 18.87 ml/m  AORTIC VALVE LVOT Vmax:   109.00 cm/s LVOT Vmean:  68.600 cm/s LVOT VTI:     0.205 m  AORTA Ao Root diam: 3.20 cm MITRAL VALVE MV Area (PHT): 3.99 cm     SHUNTS MV Decel Time: 190 msec     Systemic VTI:  0.20 m MV E velocity: 127.00 cm/s  Systemic Diam: 1.90 cm Vishnu Priya Mallipeddi Electronically signed by Winfield Rast Mallipeddi Signature Date/Time: 01/22/2023/4:33:33 PM    Final    CT Angio Chest PE W and/or Wo Contrast  Result Date: 01/21/2023 CLINICAL DATA:  Pulmonary embolism (PE) suspected, high prob. Shortness of breath. Low O2 sats. EXAM: CT ANGIOGRAPHY CHEST WITH CONTRAST TECHNIQUE: Multidetector CT imaging of the chest was performed using the standard protocol during bolus administration of intravenous contrast. Multiplanar CT image reconstructions and MIPs were obtained to evaluate the vascular anatomy. RADIATION DOSE REDUCTION: This exam was performed according to the departmental dose-optimization program which includes automated exposure control, adjustment of the mA and/or kV according to patient size and/or use of iterative reconstruction technique. CONTRAST:  75mL OMNIPAQUE IOHEXOL 350 MG/ML SOLN COMPARISON:  10/28/2019 FINDINGS: Cardiovascular: No filling defects in the pulmonary arteries to suggest pulmonary emboli. Heart is normal size. Aorta is normal caliber. Mediastinum/Nodes: No mediastinal, hilar, or axillary adenopathy. Trachea and esophagus are unremarkable. Thyroid unremarkable. Lungs/Pleura: Centrilobular and paraseptal emphysema. Ground-glass airspace opacities scattered in the inferior right upper lobe and left lower lobe, but most pronounced in the right middle lobe. Findings concerning for early multifocal pneumonia. No effusions. Upper Abdomen: No acute findings Musculoskeletal: Chest wall soft tissues are unremarkable. Review of the MIP images confirms the above findings. IMPRESSION: No evidence of pulmonary embolus. Patchy ground-glass opacities bilaterally, most pronounced in the right middle lobe concerning for early pneumonia. Emphysema  (ICD10-J43.9). Electronically Signed   By: Charlett Nose M.D.   On: 01/21/2023 23:58   DG Chest Portable 1 View  Result Date: 01/21/2023 CLINICAL DATA:  Shortness of breath for 2 days EXAM: PORTABLE CHEST 1 VIEW COMPARISON:  11/04/2019 FINDINGS: Cardiac shadow is within normal limits. The lungs are well aerated bilaterally. No focal infiltrate or effusion is seen. No bony abnormality is noted. IMPRESSION: No active disease. Electronically Signed   By: Eulah Pont.D.  On: 01/21/2023 21:10     Subjective:   Discharge Exam: Vitals:   01/24/23 1130 01/24/23 1709  BP:  (!) 126/97  Pulse: 90 96  Resp:  19  Temp:  98.1 F (36.7 C)  SpO2: 95% 96%   Vitals:   01/24/23 0919 01/24/23 1031 01/24/23 1130 01/24/23 1709  BP:  121/67  (!) 126/97  Pulse: 90 73 90 96  Resp: (!) 28 (!) 27  19  Temp:  97.7 F (36.5 C)  98.1 F (36.7 C)  TempSrc:  Oral Oral Oral  SpO2: 92% 97% 95% 96%  Weight:      Height:        General: Pt is alert, awake, not in acute distress Cardiovascular: RRR, S1/S2 +, no rubs, no gallops Respiratory: CTA bilaterally, no wheezing, no rhonchi Abdominal: Soft, NT, ND, bowel sounds + Extremities: no edema, no cyanosis   The results of significant diagnostics from this hospitalization (including imaging, microbiology, ancillary and laboratory) are listed below for reference.     Microbiology: Recent Results (from the past 240 hour(s))  Resp panel by RT-PCR (RSV, Flu A&B, Covid) Anterior Nasal Swab     Status: None   Collection Time: 01/21/23  7:41 PM   Specimen: Anterior Nasal Swab  Result Value Ref Range Status   SARS Coronavirus 2 by RT PCR NEGATIVE NEGATIVE Final    Comment: (NOTE) SARS-CoV-2 target nucleic acids are NOT DETECTED.  The SARS-CoV-2 RNA is generally detectable in upper respiratory specimens during the acute phase of infection. The lowest concentration of SARS-CoV-2 viral copies this assay can detect is 138 copies/mL. A negative result does  not preclude SARS-Cov-2 infection and should not be used as the sole basis for treatment or other patient management decisions. A negative result may occur with  improper specimen collection/handling, submission of specimen other than nasopharyngeal swab, presence of viral mutation(s) within the areas targeted by this assay, and inadequate number of viral copies(<138 copies/mL). A negative result must be combined with clinical observations, patient history, and epidemiological information. The expected result is Negative.  Fact Sheet for Patients:  BloggerCourse.com  Fact Sheet for Healthcare Providers:  SeriousBroker.it  This test is no t yet approved or cleared by the [place] FDA and  has been authorized for detection and/or diagnosis of SARS-CoV-2 by FDA under an Emergency Use Authorization (EUA). This EUA will remain  in effect (meaning this test can be used) for the duration of the COVID-19 declaration under Section 564(b)(1) of the Act, 21 U.S.C.section 360bbb-3(b)(1), unless the authorization is terminated  or revoked sooner.       Influenza A by PCR NEGATIVE NEGATIVE Final   Influenza B by PCR NEGATIVE NEGATIVE Final    Comment: (NOTE) The Xpert Xpress SARS-CoV-2/FLU/RSV plus assay is intended as an aid in the diagnosis of influenza from Nasopharyngeal swab specimens and should not be used as a sole basis for treatment. Nasal washings and aspirates are unacceptable for Xpert Xpress SARS-CoV-2/FLU/RSV testing.  Fact Sheet for Patients: BloggerCourse.com  Fact Sheet for Healthcare Providers: SeriousBroker.it  This test is not yet approved or cleared by the [place] FDA and has been authorized for detection and/or diagnosis of SARS-CoV-2 by FDA under an Emergency Use Authorization (EUA). This EUA will remain in effect (meaning this test can be used) for the  duration of the COVID-19 declaration under Section 564(b)(1) of the Act, 21 U.S.C. section 360bbb-3(b)(1), unless the authorization is terminated or revoked.     Resp Syncytial  Virus by PCR NEGATIVE NEGATIVE Final    Comment: (NOTE) Fact Sheet for Patients: BloggerCourse.com  Fact Sheet for Healthcare Providers: SeriousBroker.it  This test is not yet approved or cleared by the [place] FDA and has been authorized for detection and/or diagnosis of SARS-CoV-2 by FDA under an Emergency Use Authorization (EUA). This EUA will remain in effect (meaning this test can be used) for the duration of the COVID-19 declaration under Section 564(b)(1) of the Act, 21 U.S.C. section 360bbb-3(b)(1), unless the authorization is terminated or revoked.  Performed at [facility], [address], [place], [place] [address]   Blood culture (routine x 2)     Status: None (Preliminary result)   Collection Time: 01/22/23 12:36 AM   Specimen: BLOOD  Result Value Ref Range Status   Specimen Description BLOOD BLOOD LEFT ARM  Final   Special Requests   Final    BOTTLES DRAWN AEROBIC AND ANAEROBIC Blood Culture adequate volume   Culture   Final    NO GROWTH 1 DAY Performed at [facility], [address] [place], [place] [address]    Report Status PENDING  Incomplete  Blood culture (routine x 2)     Status: None (Preliminary result)   Collection Time: 01/22/23 12:46 AM   Specimen: BLOOD  Result Value Ref Range Status   Specimen Description BLOOD BLOOD RIGHT HAND  Final   Special Requests   Final    BOTTLES DRAWN AEROBIC AND ANAEROBIC Blood Culture adequate volume   Culture   Final    NO GROWTH 1 DAY Performed at [facility], [address], [place], [place] [address]    Report Status PENDING  Incomplete  MRSA Next Gen by PCR, Nasal     Status: Abnormal   Collection Time: 01/22/23  1:45 AM   Specimen: Nasal Mucosa; Nasal Swab  Result Value Ref  Range Status   MRSA by PCR Next Gen DETECTED (A) NOT DETECTED Final    Comment: RESULT CALLED TO, READ BACK BY AND VERIFIED WITH: CASSIDY MCBRAD @ 1040 ON 01/22/23 C VARNER (NOTE) The GeneXpert MRSA Assay (FDA approved for NASAL specimens only), is one component of a comprehensive MRSA colonization surveillance program. It is not intended to diagnose MRSA infection nor to guide or monitor treatment for MRSA infections. Test performance is not FDA approved in patients less than 72 years old. Performed at [facility], [address], [place], [place] [address]      Labs: BNP (last 3 results) No results for input(s): "BNP" in the last 8760 hours. Basic Metabolic Panel: Recent Labs  Lab 01/21/23 1830 01/22/23 0436 01/23/23 0426 01/24/23 0407  NA 136 136 134* 137  K 3.9 3.4* 3.9 4.2  CL 105 100 102 105  CO2 20* 22 21* 21*  GLUCOSE 102* 133* 118* 118*  BUN 6 8 14 17   CREATININE 0.51 0.55 0.56 0.57  CALCIUM 8.6* 9.0 8.3* 8.4*  MG  --  1.9 1.9  --   PHOS  --  2.6  --   --    Liver Function Tests: Recent Labs  Lab 01/21/23 1830 01/22/23 0436 01/23/23 0426 01/24/23 0407  AST 15 16 16  7*  ALT 14 14 12 6   ALKPHOS 90 84 63 59  BILITOT 0.4 0.4 0.4 0.4  PROT 8.1 7.7 6.4* 6.2*  ALBUMIN 4.1 3.8 3.3* 3.1*   No results for input(s): "LIPASE", "AMYLASE" in the last 168 hours. No results for input(s): "AMMONIA" in the last 168 hours. CBC:  Recent Labs  Lab 01/21/23 1830 01/22/23 0436 01/23/23 0426  WBC 9.6 8.6 16.4*  NEUTROABS 7.5  --   --   HGB 16.7* 16.0* 14.8  HCT 48.8* 46.3* 43.5  MCV 90.0 89.6 89.7  PLT 270 270 240   Cardiac Enzymes: No results for input(s): "CKTOTAL", "CKMB", "CKMBINDEX", "TROPONINI" in the last 168 hours. BNP: Invalid input(s): "POCBNP" CBG: No results for input(s): "GLUCAP" in the last 168 hours. D-Dimer No results for input(s): "DDIMER" in the last 72 hours. Hgb A1c No results for input(s): "HGBA1C" in the last 72 hours. Lipid  Profile Recent Labs    01/22/23 0436  CHOL 171  HDL 47  LDLCALC 112*  TRIG 61  CHOLHDL 3.6   Thyroid function studies No results for input(s): "TSH", "T4TOTAL", "T3FREE", "THYROIDAB" in the last 72 hours.  Invalid input(s): "FREET3" Anemia work up No results for input(s): "VITAMINB12", "FOLATE", "FERRITIN", "TIBC", "IRON", "RETICCTPCT" in the last 72 hours. Urinalysis    Component Value Date/Time   COLORURINE YELLOW 01/11/2019 0439   APPEARANCEUR HAZY (A) 01/11/2019 0439   LABSPEC 1.018 01/11/2019 0439   PHURINE 5.0 01/11/2019 0439   GLUCOSEU NEGATIVE 01/11/2019 0439   HGBUR NEGATIVE 01/11/2019 0439   BILIRUBINUR NEGATIVE 01/11/2019 0439   KETONESUR NEGATIVE 01/11/2019 0439   PROTEINUR NEGATIVE 01/11/2019 0439   UROBILINOGEN 0.2 09/18/2011 0249   NITRITE NEGATIVE 01/11/2019 0439   LEUKOCYTESUR TRACE (A) 01/11/2019 0439   Sepsis Labs Recent Labs  Lab 01/21/23 1830 01/22/23 0436 01/23/23 0426  WBC 9.6 8.6 16.4*   Microbiology Recent Results (from the past 240 hour(s))  Resp panel by RT-PCR (RSV, Flu A&B, Covid) Anterior Nasal Swab     Status: None   Collection Time: 01/21/23  7:41 PM   Specimen: Anterior Nasal Swab  Result Value Ref Range Status   SARS Coronavirus 2 by RT PCR NEGATIVE NEGATIVE Final    Comment: (NOTE) SARS-CoV-2 target nucleic acids are NOT DETECTED.  The SARS-CoV-2 RNA is generally detectable in upper respiratory specimens during the acute phase of infection. The lowest concentration of SARS-CoV-2 viral copies this assay can detect is 138 copies/mL. A negative result does not preclude SARS-Cov-2 infection and should not be used as the sole basis for treatment or other patient management decisions. A negative result may occur with  improper specimen collection/handling, submission of specimen other than nasopharyngeal swab, presence of viral mutation(s) within the areas targeted by this assay, and inadequate number of viral copies(<138  copies/mL). A negative result must be combined with clinical observations, patient history, and epidemiological information. The expected result is Negative.  Fact Sheet for Patients:  BloggerCourse.com  Fact Sheet for Healthcare Providers:  SeriousBroker.it  This test is no t yet approved or cleared by the [place] FDA and  has been authorized for detection and/or diagnosis of SARS-CoV-2 by FDA under an Emergency Use Authorization (EUA). This EUA will remain  in effect (meaning this test can be used) for the duration of the COVID-19 declaration under Section 564(b)(1) of the Act, 21 U.S.C.section 360bbb-3(b)(1), unless the authorization is terminated  or revoked sooner.       Influenza A by PCR NEGATIVE NEGATIVE Final   Influenza B by PCR NEGATIVE NEGATIVE Final    Comment: (NOTE) The Xpert Xpress SARS-CoV-2/FLU/RSV plus assay is intended as an aid in the diagnosis of influenza from Nasopharyngeal swab specimens and should not be used as a sole basis for treatment. Nasal washings and aspirates are unacceptable for Xpert Xpress SARS-CoV-2/FLU/RSV  testing.  Fact Sheet for Patients: BloggerCourse.com  Fact Sheet for Healthcare Providers: SeriousBroker.it  This test is not yet approved or cleared by the [place] FDA and has been authorized for detection and/or diagnosis of SARS-CoV-2 by FDA under an Emergency Use Authorization (EUA). This EUA will remain in effect (meaning this test can be used) for the duration of the COVID-19 declaration under Section 564(b)(1) of the Act, 21 U.S.C. section 360bbb-3(b)(1), unless the authorization is terminated or revoked.     Resp Syncytial Virus by PCR NEGATIVE NEGATIVE Final    Comment: (NOTE) Fact Sheet for Patients: BloggerCourse.com  Fact Sheet for Healthcare  Providers: SeriousBroker.it  This test is not yet approved or cleared by the [place] FDA and has been authorized for detection and/or diagnosis of SARS-CoV-2 by FDA under an Emergency Use Authorization (EUA). This EUA will remain in effect (meaning this test can be used) for the duration of the COVID-19 declaration under Section 564(b)(1) of the Act, 21 U.S.C. section 360bbb-3(b)(1), unless the authorization is terminated or revoked.  Performed at [facility], [address] [place], [place] [address]   Blood culture (routine x 2)     Status: None (Preliminary result)   Collection Time: 01/22/23 12:36 AM   Specimen: BLOOD  Result Value Ref Range Status   Specimen Description BLOOD BLOOD LEFT ARM  Final   Special Requests   Final    BOTTLES DRAWN AEROBIC AND ANAEROBIC Blood Culture adequate volume   Culture   Final    NO GROWTH 1 DAY Performed at [facility], [address], [place], [place] [address]    Report Status PENDING  Incomplete  Blood culture (routine x 2)     Status: None (Preliminary result)   Collection Time: 01/22/23 12:46 AM   Specimen: BLOOD  Result Value Ref Range Status   Specimen Description BLOOD BLOOD RIGHT HAND  Final   Special Requests   Final    BOTTLES DRAWN AEROBIC AND ANAEROBIC Blood Culture adequate volume   Culture   Final    NO GROWTH 1 DAY Performed at [facility], [address] [place], [place] [address]    Report Status PENDING  Incomplete  MRSA Next Gen by PCR, Nasal     Status: Abnormal   Collection Time: 01/22/23  1:45 AM   Specimen: Nasal Mucosa; Nasal Swab  Result Value Ref Range Status   MRSA by PCR Next Gen DETECTED (A) NOT DETECTED Final    Comment: RESULT CALLED TO, READ BACK BY AND VERIFIED WITH: CASSIDY MCBRAD @ 1040 ON 01/22/23 C VARNER (NOTE) The GeneXpert MRSA Assay (FDA approved for NASAL specimens only), is one component of a comprehensive MRSA colonization surveillance program. It is not  intended to diagnose MRSA infection nor to guide or monitor treatment for MRSA infections. Test performance is not FDA approved in patients less than 5 years old. Performed at [facility], [address], [place], [place] [address]     Time coordinating discharge:   SIGNED:  Standley Dakins, MD  [facility] 01/24/2023, 7:45 PM How to contact the [facility] Attending or Consulting provider 7A - 7P or covering provider during after hours 7P -7A, for this patient?  Check the care team in [facility] and look for a) attending/consulting [facility] provider listed and b) the [facility] team listed Log into [email or web address] and use [place]'s universal password to access. If you do not have the password, please contact the hospital operator. Locate the [facility] provider you are  looking for under [facility] and page to a number that you can be directly reached. If you still have difficulty reaching the provider, please page the [facility] (Director on Call) for the Hospitalists listed on amion for assistance.

## 2023-01-24 NOTE — Progress Notes (Signed)
Mobility Specialist Progress Note:    01/24/23 1522  Mobility  Activity Ambulated with assistance in hallway  Level of Assistance Contact guard assist, steadying assist  Assistive Device Front wheel walker  Distance Ambulated (ft) 60 ft  Range of Motion/Exercises Active;All extremities  Activity Response Tolerated well  Mobility Referral Yes  $Mobility charge 1 Mobility  Mobility Specialist Start Time (ACUTE ONLY) 1505  Mobility Specialist Stop Time (ACUTE ONLY) 1525  Mobility Specialist Time Calculation (min) (ACUTE ONLY) 20 min   Pt received in bed, eager for mobility. Required CGA to stand and ambulate with RW. Tolerated well, denies dizziness. Audible SOB, SpO2 92% on 3L during ambulation. Returned sitting EOB, pt fatigued. Alarm on, all needs met.   Lawerance Bach Mobility Specialist Please contact via Special educational needs teacher or  Rehab office at [phone]

## 2023-01-24 NOTE — Progress Notes (Signed)
   01/24/23 1545  Spiritual Encounters  Type of Visit Initial   Chaplain's Reason for Visit: responding to Spiritual Consult placed for Spiritual Assessment Chaplain time spent: 45 minutes Chaplain Interventions: Established relationship of care and support through compassionate presence and empathetic/reflective listening Provided anticipatory Guidance regarding sobriety. Explored hope, relational needs, resources, spirituality, and coping skills. Advocated for Pt's desire for rehab with CSW who entered room Chaplain Assessment: Pt Needs Spiritual: Lack of peace, guilt & shame Emotional: Grief (relative passed while she has been here), anxiety, shame Relational: old baggage with Ex's Intermediate hopes: Staying clean and sober Ultimate hopes: to "live to be 89 and enjoy my grandchildren" Pt Resources  Resources Identified: support from daughters, a desire to be clean/sober, courage Spiritual: Below average Emotional: Below average Relational: Average Additional Assessment: Pt has a strong desire to be clean and sober.  We explored the genesis of her addiction- she began using when her children we taken away from her by DSS.  Explored how that made her feel and how drugs became her attempt at self medicating. Explored what would be necessary for sobriety- not just the breaking of physical addiction, but also facing the shame and guilt that stated it, and developing better coping skills. Collaborated with Pt to form plan to share the list of rehab facilities she gets from the CSW with her daughters and ask them to help her in her sobriety journey. Chaplaincy Plan: Pt expects to be discharged tonight, thus chaplain will not be able to follow up unless Pt returns.  Chaplain Raelene Bott, MDiv [email or web address]

## 2023-01-24 NOTE — Progress Notes (Signed)
Mobility Specialist Progress Note:    01/24/23 1229  Mobility  Activity Ambulated with assistance in hallway  Level of Assistance Contact guard assist, steadying assist  Assistive Device Front wheel walker  Distance Ambulated (ft) 40 ft  Range of Motion/Exercises Active;All extremities  Activity Response Tolerated fair  Mobility Referral Yes  $Mobility charge 1 Mobility  Mobility Specialist Start Time (ACUTE ONLY) 1210  Mobility Specialist Stop Time (ACUTE ONLY) 1230  Mobility Specialist Time Calculation (min) (ACUTE ONLY) 20 min   Pt received in bed, requesting assistance to ambulate in hall. Required CGA to stand and ambulate with RW. Tolerated fair, baseline SpO2 87% on RA at rest, advised pt to put [place] back on. SpO2 92% on 2L at rest. SpO2 86% on 2L during ambulation, c/o dizziness and audible SOB. SpO2 90% on 4L during ambulation.  Returned sitting EOB, RN notified. All needs met.   Lawerance Bach Mobility Specialist Please contact via Special educational needs teacher or  Rehab office at [phone]

## 2023-01-24 NOTE — Progress Notes (Signed)
Diane Dark RN BSN Patient found smoking in her room, (311 at [facility]). Earlier the patient had asked to go out side for fresh air, I informed her the Hospital policy restricted her from going outside. The unit Charge nurse also spoke with the patient and informed her she could noty leave the unit with her daughters. About ten minutes after daughter arriving to unit, The patient was found smoking cigarettes in room 311. Hospital security was called and the patient decided to leave the Hospital AMA. She signed paperwork and it was added to her chart.

## 2023-01-24 NOTE — TOC Initial Note (Signed)
Transition of Care [facility]) - Initial/Assessment Note    Patient Details  Name: Diane Norton MRN: [id number] Date of Birth: 10-01-1985  [facility] CM/SW Contact:    Isabella Bowens, LCSWA Phone Number: 01/24/2023, 12:00 PM  Clinical Narrative:  Patient was admitted for community acquired pneumonia. Patient lives alone, but has family support from children. Resources for needle exchange programs were provided and outpatient resources/ residential's were added to AVS. Patient states that her plans are to seek an outpatient residential. Patient daughter will pick her up once she is ready for DC. TOC will continue to follow.    Expected Discharge Plan: Home/Self Care Barriers to Discharge: Continued Medical Work up   Patient Goals and CMS Choice Patient states their goals for this hospitalization and ongoing recovery are:: return home          Expected Discharge Plan and Services       Living arrangements for the past 2 months: Single Family Home                                      Prior Living Arrangements/Services Living arrangements for the past 2 months: Single Family Home Lives with:: Self Patient language and need for interpreter reviewed:: Yes Do you feel safe going back to the place where you live?: Yes               Activities of Daily Living   ADL Screening (condition at time of admission) Independently performs ADLs?: Yes (appropriate for developmental age) Is the patient deaf or have difficulty hearing?: No Does the patient have difficulty seeing, even when wearing glasses/contacts?: No Does the patient have difficulty concentrating, remembering, or making decisions?: No  Permission Sought/Granted      Share Information with NAME: Healy     Permission granted to share info w Relationship: Patient     Emotional Assessment     Affect (typically observed): Accepting, Appropriate Orientation: : Oriented to Self, Oriented to  Place, Oriented to  Time, Oriented to Situation Alcohol / Substance Use: Illicit Drugs Psych Involvement: No (comment)  Admission diagnosis:  Elevated troponin [R79.89] CAP (community acquired pneumonia) [J18.9] Heroin withdrawal (HCC) [F11.93] Multifocal pneumonia [J18.9] Patient Active Problem List   Diagnosis Date Noted   CAP (community acquired pneumonia) 01/22/2023   Elevated troponin 01/22/2023   Heroin withdrawal (HCC) 01/22/2023   Prolonged QT interval 01/22/2023   Hypocalcemia 01/22/2023   History of pulmonary embolism 01/22/2023   GERD (gastroesophageal reflux disease) 01/22/2023   Thrombocytopenia (HCC)    DIC (disseminated intravascular coagulation) (HCC)    Acute pulmonary embolism (HCC)    PNA (pneumonia) 10/28/2019   Acute Hypoxic respiratory failure (HCC) 10/27/2019   Asthma 10/27/2019   Multifocal pneumonia 10/27/2019   History of seizures 10/27/2019   Closed fracture of right femur, initial encounter (HCC) 01/10/2015   PCP:  Patient, No Pcp Per Pharm[facility]:   [facility] [place] [facility] [address] [place] [address] Phone: [phone] Fax: [phone]  [facility] [address] [place] - [address] [place] [address] Phone: [phone] Fax: [phone]     Social Determinants of Health (SDOH) Social History: SDOH Screenings   Food Insecurity: No Food Insecurity (01/22/2023)  Housing: Patient Declined (01/22/2023)  Transportation Needs: No Transportation Needs (01/22/2023)  Utilities: Not At Risk (01/22/2023)  Tobacco Use: High Risk (01/21/2023)   SDOH Interventions:     Readmission Risk Interventions     No data to display

## 2023-01-24 NOTE — Progress Notes (Signed)
PROGRESS NOTE   Diane Norton  ZOX:[id number] DOB: 04-Oct-1985 DOA: 01/21/2023 PCP: Patient, No Pcp Per   Chief Complaint  Patient presents with   Shortness of Breath   Level of care: Med-Surg  Brief Admission History:  37 y.o. female with medical history significant of PE, GERD, heroin abuse, tobacco abuse who presents to the emergency department from home via EMS due to shortness of breath which started yesterday and was associated with chest pain which was prescribed as pressure-like and Holts with deep breath.  She complained of several days of upper respiratory tract infection symptoms including nasal congestion, sore throat, productive cough which has resulted in her smoking less since she was not feeling good.   ED Course:  In the emergency department, he was hemodynamically stable.  Workup in the ED showed normocytic anemia, BMP was normal except for bicarb of 20 and blood glucose of 102, calcium 8.6.  Troponin 161 > 73 > 265 > 212.  Influenza A, B, SARS coronavirus 2, RSV was negative.  Blood culture pending. Chest x-ray showed no active disease.  CT angiography chest with contrast showed no evidence of pulmonary embolus.  Patchy groundglass opacities bilaterally, most pronounced in the right middle lobe concerning for early pneumonia.  She was treated with ceftriaxone and azithromycin.  Present treatment was provided, Suboxone and Benadryl were given due to withdrawal from heroin.  Cardiologist on-call was consulted and recommended admitting the patient here at [facility] with plan for cardiology to consult on patient in the morning per ED PA.  Hospitalist was asked to admit patient for further evaluation and management.   Assessment and Plan:  CAP  Patient was started on ceftriaxone and azithromycin with plan for 5 days therapy  Sputum culture, urine Legionella, strep pneumo still pending Continue Tylenol as needed Continue Mucinex, incentive spirometry, flutter valve   Acute  respiratory failure with hypoxia Weaning down supplemental oxygen with goal to wean to room air    Elevated troponin, ruled out NSTEMI Troponin 161 > 73 > 265 > 212  Patient was started on heparin drip but now discontinued Cardiology was consulted and felt this was demand ischemia   Heroin withdrawal Pt required IV precedex infusion and lorazepam due to severe withdrawal  Suboxone orderset bundle started 11/21 for severe opioid use disorder   Prolonged QT interval Magnesium level is 1.9 Repeat EKG ordered   Hypocalcemia Calcium corrected to 8.86, continue Os-Cal   History of pulmonary embolism She is reportedly off apixaban now.   GERD Continue Protonix   DVT prophylaxis: enoxaparin  Code Status: Full  Family Communication:  Disposition: anticipate home in 1-2 days    Consultants:  cardiology Procedures:   Antimicrobials:   Ceftriaxone 11/19>>  Azithromycin 11/19>>  Subjective: Pt weaned off IV precedex infusion, now asking to start suboxone therapy. Pt says she is breathing better today.   Objective: Vitals:   01/24/23 0445 01/24/23 0710 01/24/23 0739 01/24/23 0800  BP: 101/65 101/83  124/80  Pulse: 75 82  85  Resp: (!) 22 (!) 23  (!) 25  Temp:   98.1 F (36.7 C)   TempSrc:   Oral   SpO2: 96% 95%  94%  Weight:      Height:        Intake/Output Summary (Last 24 hours) at 01/24/2023 0857 Last data filed at 01/24/2023 0734 Gross per 24 hour  Intake 1189.55 ml  Output 900 ml  Net 289.55 ml   Filed Weights   01/21/23  1801 01/22/23 0145  Weight: 60 kg 75.8 kg   Examination:  General exam: awake, alert, NAD.    Respiratory system: BBS sound mostly clear, no wheezing heard. No increased work of breathing.   Cardiovascular system: normal S1 & S2 heard. No JVD, murmurs, rubs, gallops or clicks. No pedal edema. Gastrointestinal system: Abdomen is nondistended, soft and nontender. No organomegaly or masses felt. Normal bowel sounds heard. Central nervous  system: Alert and oriented. No focal neurological deficits. Extremities: Symmetric 5 x 5 power. Skin: No rashes, lesions or ulcers. Psychiatry: Judgement and insight appear poor. Mood & affect anxious.   Data Reviewed: I have personally reviewed following labs and imaging studies  CBC: Recent Labs  Lab 01/21/23 1830 01/22/23 0436 01/23/23 0426  WBC 9.6 8.6 16.4*  NEUTROABS 7.5  --   --   HGB 16.7* 16.0* 14.8  HCT 48.8* 46.3* 43.5  MCV 90.0 89.6 89.7  PLT 270 270 240    Basic Metabolic Panel: Recent Labs  Lab 01/21/23 1830 01/22/23 0436 01/23/23 0426 01/24/23 0407  NA 136 136 134* 137  K 3.9 3.4* 3.9 4.2  CL 105 100 102 105  CO2 20* 22 21* 21*  GLUCOSE 102* 133* 118* 118*  BUN 6 8 14 17   CREATININE 0.51 0.55 0.56 0.57  CALCIUM 8.6* 9.0 8.3* 8.4*  MG  --  1.9 1.9  --   PHOS  --  2.6  --   --     CBG: No results for input(s): "GLUCAP" in the last 168 hours.  Recent Results (from the past 240 hour(s))  Resp panel by RT-PCR (RSV, Flu A&B, Covid) Anterior Nasal Swab     Status: None   Collection Time: 01/21/23  7:41 PM   Specimen: Anterior Nasal Swab  Result Value Ref Range Status   SARS Coronavirus 2 by RT PCR NEGATIVE NEGATIVE Final    Comment: (NOTE) SARS-CoV-2 target nucleic acids are NOT DETECTED.  The SARS-CoV-2 RNA is generally detectable in upper respiratory specimens during the acute phase of infection. The lowest concentration of SARS-CoV-2 viral copies this assay can detect is 138 copies/mL. A negative result does not preclude SARS-Cov-2 infection and should not be used as the sole basis for treatment or other patient management decisions. A negative result may occur with  improper specimen collection/handling, submission of specimen other than nasopharyngeal swab, presence of viral mutation(s) within the areas targeted by this assay, and inadequate number of viral copies(<138 copies/mL). A negative result must be combined with clinical observations,  patient history, and epidemiological information. The expected result is Negative.  Fact Sheet for Patients:  BloggerCourse.com  Fact Sheet for Healthcare Providers:  SeriousBroker.it  This test is no t yet approved or cleared by the [place] FDA and  has been authorized for detection and/or diagnosis of SARS-CoV-2 by FDA under an Emergency Use Authorization (EUA). This EUA will remain  in effect (meaning this test can be used) for the duration of the COVID-19 declaration under Section 564(b)(1) of the Act, 21 U.S.C.section 360bbb-3(b)(1), unless the authorization is terminated  or revoked sooner.       Influenza A by PCR NEGATIVE NEGATIVE Final   Influenza B by PCR NEGATIVE NEGATIVE Final    Comment: (NOTE) The Xpert Xpress SARS-CoV-2/FLU/RSV plus assay is intended as an aid in the diagnosis of influenza from Nasopharyngeal swab specimens and should not be used as a sole basis for treatment. Nasal washings and aspirates are unacceptable for Xpert Xpress SARS-CoV-2/FLU/RSV testing.  Fact Sheet for Patients: BloggerCourse.com  Fact Sheet for Healthcare Providers: SeriousBroker.it  This test is not yet approved or cleared by the [place] FDA and has been authorized for detection and/or diagnosis of SARS-CoV-2 by FDA under an Emergency Use Authorization (EUA). This EUA will remain in effect (meaning this test can be used) for the duration of the COVID-19 declaration under Section 564(b)(1) of the Act, 21 U.S.C. section 360bbb-3(b)(1), unless the authorization is terminated or revoked.     Resp Syncytial Virus by PCR NEGATIVE NEGATIVE Final    Comment: (NOTE) Fact Sheet for Patients: BloggerCourse.com  Fact Sheet for Healthcare Providers: SeriousBroker.it  This test is not yet approved or cleared by the [place] FDA and has been authorized for detection and/or diagnosis of SARS-CoV-2 by FDA under an Emergency Use Authorization (EUA). This EUA will remain in effect (meaning this test can be used) for the duration of the COVID-19 declaration under Section 564(b)(1) of the Act, 21 U.S.C. section 360bbb-3(b)(1), unless the authorization is terminated or revoked.  Performed at [facility], [address] [place], [place] [address]   Blood culture (routine x 2)     Status: None (Preliminary result)   Collection Time: 01/22/23 12:36 AM   Specimen: BLOOD  Result Value Ref Range Status   Specimen Description BLOOD BLOOD LEFT ARM  Final   Special Requests   Final    BOTTLES DRAWN AEROBIC AND ANAEROBIC Blood Culture adequate volume   Culture   Final    NO GROWTH 1 DAY Performed at [facility], [address] [place], [place] [address]    Report Status PENDING  Incomplete  Blood culture (routine x 2)     Status: None (Preliminary result)   Collection Time: 01/22/23 12:46 AM   Specimen: BLOOD  Result Value Ref Range Status   Specimen Description BLOOD BLOOD RIGHT HAND  Final   Special Requests   Final    BOTTLES DRAWN AEROBIC AND ANAEROBIC Blood Culture adequate volume   Culture   Final    NO GROWTH 1 DAY Performed at [facility], [address], [place], [place] [address]    Report Status PENDING  Incomplete  MRSA Next Gen by PCR, Nasal     Status: Abnormal   Collection Time: 01/22/23  1:45 AM   Specimen: Nasal Mucosa; Nasal Swab  Result Value Ref Range Status   MRSA by PCR Next Gen DETECTED (A) NOT DETECTED Final    Comment: RESULT CALLED TO, READ BACK BY AND VERIFIED WITH: CASSIDY MCBRAD @ 1040 ON 01/22/23 C VARNER (NOTE) The GeneXpert MRSA Assay (FDA approved for NASAL specimens only), is one component of a comprehensive MRSA colonization surveillance program. It is not intended to diagnose MRSA infection nor to guide or monitor treatment for MRSA infections. Test performance is  not FDA approved in patients less than 64 years old. Performed at [facility], [address] [place], [place] [address]      Radiology Studies: ECHOCARDIOGRAM COMPLETE  Result Date: 01/22/2023    ECHOCARDIOGRAM REPORT   Patient Name:   PARISH DURIE Date of Exam: 01/22/2023 Medical Rec #:  [id number]       Height:       64.0 in Accession #:    [id number]      Weight:       167.1 lb Date of Birth:  02-25-1986        BSA:          1.813 m  Patient Age:    37 years        BP:           139/78 mmHg Patient Gender: F               HR:           81 bpm. [facility] Procedure: 2D Echo, Cardiac Doppler and Color Doppler Indications:    NSTEMI l21.4  History:        Patient has prior history of Echocardiogram examinations, most                 recent 10/28/2019. Heroin withdrawal (HCC), Elevated troponin.  Sonographer:    Celesta Gentile RCS Referring Phys: [id number] Tarri Abernethy [facility]  Sonographer Comments: Image acquisition challenging due to respiratory motion, Image acquisition challenging due to uncooperative patient and Image acquisition challenging due to patient behavioral factors. IMPRESSIONS  1. Left ventricular ejection fraction, by estimation, is 55 to 60%. The left ventricle has normal function. Left ventricular endocardial border not optimally defined to evaluate regional wall motion. Indeterminate diastolic filling due to E-A fusion.  2. Right ventricular systolic function is normal. The right ventricular size is normal. Tricuspid regurgitation signal is inadequate for assessing PA pressure.  3. The mitral valve is grossly normal. Mild mitral valve regurgitation. No evidence of mitral stenosis.  4. The aortic valve was not well visualized. Aortic valve regurgitation is not visualized. No aortic stenosis is present.  5. The inferior vena cava is normal in size with <50% respiratory variability, suggesting right atrial pressure of 8 mmHg. Comparison(s): Changes from prior study are noted. RV size  and function are normal now. LV function is stable. FINDINGS  Left Ventricle: Left ventricular ejection fraction, by estimation, is 55 to 60%. The left ventricle has normal function. Left ventricular endocardial border not optimally defined to evaluate regional wall motion. The left ventricular internal cavity size was normal in size. There is no left ventricular hypertrophy. Indeterminate diastolic filling due to E-A fusion. Right Ventricle: The right ventricular size is normal. No increase in right ventricular wall thickness. Right ventricular systolic function is normal. Tricuspid regurgitation signal is inadequate for assessing PA pressure. Left Atrium: Left atrial size was normal in size. Right Atrium: Right atrial size was normal in size. Pericardium: There is no evidence of pericardial effusion. Mitral Valve: The mitral valve is grossly normal. Mild mitral valve regurgitation. No evidence of mitral valve stenosis. Tricuspid Valve: The tricuspid valve is not well visualized. Tricuspid valve regurgitation is not demonstrated. No evidence of tricuspid stenosis. Aortic Valve: The aortic valve was not well visualized. Aortic valve regurgitation is not visualized. No aortic stenosis is present. Pulmonic Valve: The pulmonic valve was not well visualized. Pulmonic valve regurgitation is not visualized. No evidence of pulmonic stenosis. Aorta: The aortic root is normal in size and structure. Venous: The inferior vena cava is normal in size with less than 50% respiratory variability, suggesting right atrial pressure of 8 mmHg. IAS/Shunts: The interatrial septum was not well visualized.  LEFT VENTRICLE PLAX 2D LVIDd:         4.90 cm   Diastology LVIDs:         3.50 cm   LV e' lateral:   14.40 cm/s LV PW:         1.00 cm   LV E/e' lateral: 8.8 LV IVS:        0.80 cm LVOT diam:     1.90 cm LV  SV:         58 LV SV Index:   32 LVOT Area:     2.84 cm  RIGHT VENTRICLE TAPSE (M-mode): 2.8 cm LEFT ATRIUM             Index         RIGHT ATRIUM          Index LA diam:        3.10 cm 1.71 cm/m   RA Area:     9.27 cm LA Vol (A2C):   37.3 ml 20.58 ml/m  RA Volume:   16.70 ml 9.21 ml/m LA Vol (A4C):   26.2 ml 14.45 ml/m LA Biplane Vol: 34.2 ml 18.87 ml/m  AORTIC VALVE LVOT Vmax:   109.00 cm/s LVOT Vmean:  68.600 cm/s LVOT VTI:    0.205 m  AORTA Ao Root diam: 3.20 cm MITRAL VALVE MV Area (PHT): 3.99 cm     SHUNTS MV Decel Time: 190 msec     Systemic VTI:  0.20 m MV E velocity: 127.00 cm/s  Systemic Diam: 1.90 cm Vishnu Priya Mallipeddi Electronically signed by Winfield Rast Mallipeddi Signature Date/Time: 01/22/2023/4:33:33 PM    Final     Scheduled Meds:  buprenorphine-naloxone  1 tablet Sublingual BID   Chlorhexidine Gluconate Cloth  6 each Topical Daily   enoxaparin (LOVENOX) injection  40 mg Subcutaneous Q24H   ipratropium-albuterol  3 mL Nebulization QID   mupirocin ointment   Nasal BID   pantoprazole  40 mg Oral Q0600   [START ON 01/25/2023] predniSONE  20 mg Oral Q breakfast   Continuous Infusions:  azithromycin Stopped (01/24/23 0152)   cefTRIAXone (ROCEPHIN)  IV Stopped (01/24/23 0228)   lactated ringers 75 mL/hr at 01/24/23 0734     LOS: 2 days   Critical Care Procedure Note Authorized and Performed by: Maryln Manuel MD  Total Critical Care time:  54 mins Due to a high probability of clinically significant, life threatening deterioration, the patient required my highest level of preparedness to intervene emergently and I personally spent this critical care time directly and personally managing the patient.  This critical care time included obtaining a history; examining the patient, pulse oximetry; ordering and review of studies; arranging urgent treatment with development of a management plan; evaluation of patient's response of treatment; frequent reassessment; and discussions with other providers.  This critical care time was performed to assess and manage the high probability of imminent and life threatening  deterioration that could result in multi-organ failure.  It was exclusive of separately billable procedures and treating other patients and teaching time.    Standley Dakins, MD How to contact the [facility] Attending or Consulting provider 7A - 7P or covering provider during after hours 7P -7A, for this patient?  Check the care team in [facility] and look for a) attending/consulting TR[facility] provider listed and b) the [facility] team listed Log into [email or web address] to find provider on call.  Locate the [facility] provider you are looking for under [facility] and page to a number that you can be directly reached. If you still have difficulty reaching the provider, please page the [facility] (Director on Call) for the Hospitalists listed on amion for assistance.  01/24/2023, 8:57 AM

## 2023-01-24 NOTE — Progress Notes (Signed)
PHARMACIST - PHYSICIAN COMMUNICATION DR:   [facility] CONCERNING: Antibiotic IV to Oral Route Change Policy  RECOMMENDATION: This patient is receiving azithromycin by the intravenous route.  Based on criteria approved by the Pharmacy and Therapeutics Committee, the antibiotic(s) is/are being converted to the equivalent oral dose form(s).   DESCRIPTION: These criteria include: Patient being treated for a respiratory tract infection, urinary tract infection, cellulitis or clostridium difficile associated diarrhea if on metronidazole The patient is not neutropenic and does not exhibit a GI malabsorption state The patient is eating (either orally or via tube) and/or has been taking other orally administered medications for a least 24 hours The patient is improving clinically and has a Tmax < 100.5  If you have questions about this conversion, please contact the Pharmacy Department  [x]   [phone] )  [facility] []   [phone] )  [facility] []   [phone] )  [facility] []   [phone] )  [facility] []   [phone] )  [facility]    Juliette Alcide, PharmD, [place], [place] Work Cell: [phone] 01/24/2023 10:57 AM

## 2023-01-27 ENCOUNTER — Encounter: Payer: Medicaid Other | Admitting: Nurse Practitioner

## 2023-01-27 ENCOUNTER — Telehealth: Payer: Medicaid Other | Admitting: Family Medicine

## 2023-01-27 DIAGNOSIS — F1193 Opioid use, unspecified with withdrawal: Secondary | ICD-10-CM

## 2023-01-27 DIAGNOSIS — J189 Pneumonia, unspecified organism: Secondary | ICD-10-CM | POA: Diagnosis not present

## 2023-01-27 DIAGNOSIS — R7989 Other specified abnormal findings of blood chemistry: Secondary | ICD-10-CM

## 2023-01-27 LAB — CULTURE, BLOOD (ROUTINE X 2)
Culture: NO GROWTH
Culture: NO GROWTH
Special Requests: ADEQUATE
Special Requests: ADEQUATE

## 2023-01-27 NOTE — Progress Notes (Signed)
Virtual Visit Consent   Diane Norton, you are scheduled for a virtual visit with a [facility] provider today. Just as with appointments in the office, your consent must be obtained to participate. Your consent will be active for this visit and any virtual visit you may have with one of our providers in the next 365 days. If you have a MyChart account, a copy of this consent can be sent to you electronically.  As this is a virtual visit, video technology does not allow for your provider to perform a traditional examination. This may limit your provider's ability to fully assess your condition. If your provider identifies any concerns that need to be evaluated in person or the need to arrange testing (such as labs, EKG, etc.), we will make arrangements to do so. Although advances in technology are sophisticated, we cannot ensure that it will always work on either your end or our end. If the connection with a video visit is poor, the visit may have to be switched to a telephone visit. With either a video or telephone visit, we are not always able to ensure that we have a secure connection.  By engaging in this virtual visit, you consent to the provision of healthcare and authorize for your insurance to be billed (if applicable) for the services provided during this visit. Depending on your insurance coverage, you may receive a charge related to this service.  I need to obtain your verbal consent now. Are you willing to proceed with your visit today? Diane Norton has provided verbal consent on 01/27/2023 for a virtual visit (video or telephone). Claiborne Rigg, NP  Date: 01/27/2023 3:16 PM  Virtual Visit via Video Note   I, Claiborne Rigg, connected with  Diane Norton  ([id number], 20-Jun-1985) on 01/27/23 at  3:15 PM EST by a video-enabled telemedicine application and verified that I am speaking with the correct person using two identifiers.  Location: Patient: Virtual Visit Location Patient:  Home Provider: Virtual Visit Location Provider: Home Office   I discussed the limitations of evaluation and management by telemedicine and the availability of in person appointments. The patient expressed understanding and agreed to proceed.    History of Present Illness: Diane Norton is a 37 y.o. who identifies as a female who was assigned female at birth, and is being seen today for HFU.  Diane Norton left AMA on 01-24-2023. Today she states her uncle passed and she had to get to his funeral as the reason for her leaving AMA. She was diagnosed with CAP along with elevated troponins and prolonged QT. Cardiology was consulted however full workup was not completed due to Diane Norton leaving the hospital. She is requesting an antibiotic today. I have instructed her that she needs to be seen in the acute hospital setting for re evaluation of her cardiac enzymes, XRAY and EKG. She did receive IV abx while hospitalized for 2 days.    Problems:  Patient Active Problem List   Diagnosis Date Noted   CAP (community acquired pneumonia) 01/22/2023   Elevated troponin 01/22/2023   Heroin withdrawal (HCC) 01/22/2023   Prolonged QT interval 01/22/2023   Hypocalcemia 01/22/2023   History of pulmonary embolism 01/22/2023   GERD (gastroesophageal reflux disease) 01/22/2023   Thrombocytopenia (HCC)    DIC (disseminated intravascular coagulation) (HCC)    Acute pulmonary embolism (HCC)    PNA (pneumonia) 10/28/2019   Acute Hypoxic respiratory failure (HCC) 10/27/2019   Asthma 10/27/2019  Multifocal pneumonia 10/27/2019   History of seizures 10/27/2019   Closed fracture of right femur, initial encounter (HCC) 01/10/2015    Allergies:  Allergies  Allergen Reactions   Bactrim [Sulfamethoxazole-Trimethoprim] Nausea And Vomiting   Darvocet [Propoxyphene N-Acetaminophen] Hives   Other Other (See Comments)    darvocet   Penicillins Hives and Itching   Bactrim [Sulfamethoxazole-Trimethoprim] Rash    Ciprofloxacin Nausea And Vomiting and Rash   Medications:  Current Outpatient Medications:    acetaminophen (TYLENOL) 325 MG tablet, Take 2 tablets (650 mg total) by mouth every 6 (six) hours as needed for mild pain (or Fever >/= 101)., Disp: 12 tablet, Rfl: 0   guaiFENesin (MUCINEX) 600 MG 12 hr tablet, Take 1 tablet (600 mg total) by mouth 2 (two) times daily., Disp: 20 tablet, Rfl: 0   traZODone (DESYREL) 150 MG tablet, Take 1 tablet (150 mg total) by mouth at bedtime., Disp: 30 tablet, Rfl: 3  Observations/Objective: Patient is well-developed, well-nourished in no acute distress.  Resting comfortably at home.  Head is normocephalic, atraumatic.  No labored breathing.  Speech is clear and coherent with logical content.  Patient is alert and oriented at baseline.    Assessment and Plan: 1. Community acquired pneumonia, unspecified laterality Needs to be re evaluated in the acute hospital or urgent care setting. She verbalized understanding.   Follow Up Instructions: I discussed the assessment and treatment plan with the patient. The patient was provided an opportunity to ask questions and all were answered. The patient agreed with the plan and demonstrated an understanding of the instructions.  A copy of instructions were sent to the patient via MyChart unless otherwise noted below.    The patient was advised to call back or seek an in-person evaluation if the symptoms worsen or if the condition fails to improve as anticipated.    Claiborne Rigg, NP

## 2023-01-27 NOTE — Progress Notes (Signed)
Virtual Visit Consent   Diane Norton, you are scheduled for a virtual visit with a [facility] provider today. Just as with appointments in the office, your consent must be obtained to participate. Your consent will be active for this visit and any virtual visit you may have with one of our providers in the next 365 days. If you have a MyChart account, a copy of this consent can be sent to you electronically.  As this is a virtual visit, video technology does not allow for your provider to perform a traditional examination. This may limit your provider's ability to fully assess your condition. If your provider identifies any concerns that need to be evaluated in person or the need to arrange testing (such as labs, EKG, etc.), we will make arrangements to do so. Although advances in technology are sophisticated, we cannot ensure that it will always work on either your end or our end. If the connection with a video visit is poor, the visit may have to be switched to a telephone visit. With either a video or telephone visit, we are not always able to ensure that we have a secure connection.  By engaging in this virtual visit, you consent to the provision of healthcare and authorize for your insurance to be billed (if applicable) for the services provided during this visit. Depending on your insurance coverage, you may receive a charge related to this service.  I need to obtain your verbal consent now. Are you willing to proceed with your visit today? Diane Norton has provided verbal consent on 01/27/2023 for a virtual visit (video or telephone). Georgana Curio, FNP  Date: 01/27/2023 12:53 PM  Virtual Visit via Video Note   I, Georgana Curio, connected with  Diane Norton  ([id number], 07/03/1985) on 01/27/23 at 12:45 PM EST by a video-enabled telemedicine application and verified that I am speaking with the correct person using two identifiers.  Location: Patient: Virtual Visit Location Patient:  Home Provider: Virtual Visit Location Provider: Home Office   I discussed the limitations of evaluation and management by telemedicine and the availability of in person appointments. The patient expressed understanding and agreed to proceed.    History of Present Illness: Diane Norton is a 37 y.o. who identifies as a female who was assigned female at birth, and is being seen today for cough, heroin withdrawal and concerns about elevated troponin in ED last week. See notes from [facility] Hawking She left AMA from ICU when her uncle died. She denies chest pain today, She reports withdrawal sx and is worried about pneumonia and elevated troponin levels. Marland Kitchen  HPI: HPI  Problems:  Patient Active Problem List   Diagnosis Date Noted   CAP (community acquired pneumonia) 01/22/2023   Elevated troponin 01/22/2023   Heroin withdrawal (HCC) 01/22/2023   Prolonged QT interval 01/22/2023   Hypocalcemia 01/22/2023   History of pulmonary embolism 01/22/2023   GERD (gastroesophageal reflux disease) 01/22/2023   Thrombocytopenia (HCC)    DIC (disseminated intravascular coagulation) (HCC)    Acute pulmonary embolism (HCC)    PNA (pneumonia) 10/28/2019   Acute Hypoxic respiratory failure (HCC) 10/27/2019   Asthma 10/27/2019   Multifocal pneumonia 10/27/2019   History of seizures 10/27/2019   Closed fracture of right femur, initial encounter (HCC) 01/10/2015    Allergies:  Allergies  Allergen Reactions   Bactrim [Sulfamethoxazole-Trimethoprim] Nausea And Vomiting   Darvocet [Propoxyphene N-Acetaminophen] Hives   Other Other (See Comments)    darvocet  Penicillins Hives and Itching   Bactrim [Sulfamethoxazole-Trimethoprim] Rash   Ciprofloxacin Nausea And Vomiting and Rash   Medications:  Current Outpatient Medications:    acetaminophen (TYLENOL) 325 MG tablet, Take 2 tablets (650 mg total) by mouth every 6 (six) hours as needed for mild pain (or Fever >/= 101)., Disp: 12 tablet, Rfl: 0   guaiFENesin  (MUCINEX) 600 MG 12 hr tablet, Take 1 tablet (600 mg total) by mouth 2 (two) times daily., Disp: 20 tablet, Rfl: 0   traZODone (DESYREL) 150 MG tablet, Take 1 tablet (150 mg total) by mouth at bedtime., Disp: 30 tablet, Rfl: 3  Observations/Objective: Patient is well-developed, well-nourished in no acute distress.  Resting comfortably  at home.  Head is normocephalic, atraumatic.  No labored breathing.  Speech is clear and coherent with logical content.  Patient is alert and oriented at baseline.    Assessment and Plan: 1. Elevated troponin  2. Community acquired pneumonia, unspecified laterality  3. Heroin withdrawal (HCC)  Proceed back to ED for further eval.   Follow Up Instructions: I discussed the assessment and treatment plan with the patient. The patient was provided an opportunity to ask questions and all were answered. The patient agreed with the plan and demonstrated an understanding of the instructions.  A copy of instructions were sent to the patient via MyChart unless otherwise noted below.     The patient was advised to call back or seek an in-person evaluation if the symptoms worsen or if the condition fails to improve as anticipated.    Georgana Curio, FNP   Spent 20 min reviewing chart and discussing case with patient. DWB

## 2023-01-27 NOTE — Patient Instructions (Signed)
Troponin Test Why am I having this test? The troponin test is used to help determine if you have had a heart attack or other injury to the heart muscle. The heart muscle is also called a cardiac muscle. You may have this test if: You are having chest pain or other symptoms of a heart attack. You have heart disease. You have had heart surgery. You had complications during surgery, such as bleeding or very low blood pressure. You have a critical illness (sepsis). You have severe anemia (low hemoglobin). The test is used along with other tests to diagnose heart damage (ischemia). What is being tested? This test measures the concentration of troponin in your blood. Troponins are proteins that help muscles contract. Cardiac-specific troponins are normally present in very small amounts in the blood. When there is damage to heart muscle cells, troponins are released into the blood. The more damage there is, the greater the concentration of troponins. There is more than one type of troponin protein. Depending on the lab where you have your test done, you may have troponin T, troponin I, or a high-sensitivity troponin (hsT) tested. When a person has damage to the heart cells, levels of troponin can become elevated in the blood within a few hours after the injury and may remain elevated for 7-14 days. What kind of sample is taken?  A blood sample is required for this test. It is usually collected by inserting a needle into a blood vessel. Usually, the first blood sample is collected, and then another sample is collected 1-6 hours later. Another sample may be collected after 6 hours if: Your results were normal, but you are at risk for a heart attack. You had a heart attack, and your health care provider wants to monitor your treatment. How do I prepare for this test? There is no preparation required for this test. How are the results reported? Your test results will be reported as values. Your health  care provider will compare your results to normal ranges that were established after testing a large group of people (reference ranges). Reference ranges may vary among labs and hospitals. For this test, some examples of common reference ranges are: Cardiac troponin T: less than 0.1 ng/mL. Cardiac troponin I: less than 0.04 ng/mL. hsT: less than 10 ng/L in women and less than 15 ng/L in men. What do the results mean? Troponin values above the reference values may indicate: Heart attack. Heart failure. Other injury to the heart muscle. Inflammation of the heart muscle (myocarditis). A result above the reference range does not always mean that you have heart damage. Test levels can be falsely elevated in people after dialysis or in people with certain medical conditions. Talk with your health care provider about what your results mean. Questions to ask your health care provider Ask your health care provider, or the department that is doing the test: When will my results be ready? How will I get my results? What are my treatment options? What other tests do I need? What are my next steps? Summary The cardiac-specific troponin test is used to determine if you have had a heart attack or other injury to cardiac muscle. Usually, the first blood sample is collected, and then another blood sample is collected 1-6 hours later, depending on the test performed. Troponin values above the reference values may indicate heart attack or other injury to cardiac muscle. Talk with your health care provider about what your results mean. This information is not intended  to replace advice given to you by your health care provider. Make sure you discuss any questions you have with your health care provider. Document Revised: 09/23/2020 Document Reviewed: 09/23/2020 Elsevier Patient Education  2024 [employer].

## 2023-02-08 ENCOUNTER — Other Ambulatory Visit: Payer: Self-pay | Admitting: Medical Genetics

## 2023-02-13 ENCOUNTER — Other Ambulatory Visit (HOSPITAL_COMMUNITY): Payer: Self-pay

## 2023-02-15 ENCOUNTER — Other Ambulatory Visit (HOSPITAL_COMMUNITY): Payer: Self-pay

## 2023-02-18 ENCOUNTER — Other Ambulatory Visit (HOSPITAL_COMMUNITY): Payer: Self-pay | Attending: Medical Genetics

## 2023-04-19 ENCOUNTER — Emergency Department (HOSPITAL_COMMUNITY)

## 2023-04-19 ENCOUNTER — Encounter (HOSPITAL_COMMUNITY): Payer: Self-pay | Admitting: *Deleted

## 2023-04-19 ENCOUNTER — Emergency Department (HOSPITAL_COMMUNITY)
Admission: EM | Admit: 2023-04-19 | Discharge: 2023-04-19 | Disposition: A | Discharge status: Home / Self Care | Attending: Emergency Medicine | Admitting: Emergency Medicine

## 2023-04-19 ENCOUNTER — Other Ambulatory Visit: Payer: Self-pay

## 2023-04-19 DIAGNOSIS — Z20822 Contact with and (suspected) exposure to covid-19: Secondary | ICD-10-CM | POA: Diagnosis not present

## 2023-04-19 DIAGNOSIS — X58XXXA Exposure to other specified factors, initial encounter: Secondary | ICD-10-CM | POA: Diagnosis not present

## 2023-04-19 DIAGNOSIS — R197 Diarrhea, unspecified: Secondary | ICD-10-CM | POA: Insufficient documentation

## 2023-04-19 DIAGNOSIS — R112 Nausea with vomiting, unspecified: Secondary | ICD-10-CM | POA: Insufficient documentation

## 2023-04-19 DIAGNOSIS — T50902A Poisoning by unspecified drugs, medicaments and biological substances, intentional self-harm, initial encounter: Secondary | ICD-10-CM

## 2023-04-19 DIAGNOSIS — M791 Myalgia, unspecified site: Secondary | ICD-10-CM | POA: Diagnosis not present

## 2023-04-19 DIAGNOSIS — R059 Cough, unspecified: Secondary | ICD-10-CM | POA: Insufficient documentation

## 2023-04-19 DIAGNOSIS — T401X2A Poisoning by heroin, intentional self-harm, initial encounter: Secondary | ICD-10-CM | POA: Diagnosis not present

## 2023-04-19 DIAGNOSIS — R1110 Vomiting, unspecified: Secondary | ICD-10-CM

## 2023-04-19 LAB — CBC WITH DIFFERENTIAL/PLATELET
Abs Immature Granulocytes: 0.02 10*3/uL (ref 0.00–0.07)
Basophils Absolute: 0 10*3/uL (ref 0.0–0.1)
Basophils Relative: 0 %
Eosinophils Absolute: 0.2 10*3/uL (ref 0.0–0.5)
Eosinophils Relative: 2 %
HCT: 44 % (ref 36.0–46.0)
Hemoglobin: 14.8 g/dL (ref 12.0–15.0)
Immature Granulocytes: 0 %
Lymphocytes Relative: 25 %
Lymphs Abs: 2.6 10*3/uL (ref 0.7–4.0)
MCH: 30.9 pg (ref 26.0–34.0)
MCHC: 33.6 g/dL (ref 30.0–36.0)
MCV: 91.9 fL (ref 80.0–100.0)
Monocytes Absolute: 0.8 10*3/uL (ref 0.1–1.0)
Monocytes Relative: 8 %
Neutro Abs: 6.5 10*3/uL (ref 1.7–7.7)
Neutrophils Relative %: 65 %
Platelets: 202 10*3/uL (ref 150–400)
RBC: 4.79 MIL/uL (ref 3.87–5.11)
RDW: 13.2 % (ref 11.5–15.5)
WBC: 10 10*3/uL (ref 4.0–10.5)
nRBC: 0 % (ref 0.0–0.2)

## 2023-04-19 LAB — COMPREHENSIVE METABOLIC PANEL
ALT: 9 U/L (ref 0–44)
AST: 15 U/L (ref 15–41)
Albumin: 3.8 g/dL (ref 3.5–5.0)
Alkaline Phosphatase: 58 U/L (ref 38–126)
Anion gap: 9 (ref 5–15)
BUN: 16 mg/dL (ref 6–20)
CO2: 23 mmol/L (ref 22–32)
Calcium: 8.8 mg/dL — ABNORMAL LOW (ref 8.9–10.3)
Chloride: 104 mmol/L (ref 98–111)
Creatinine, Ser: 0.68 mg/dL (ref 0.44–1.00)
GFR, Estimated: >60 mL/min (ref >60)
Glucose, Bld: 93 mg/dL (ref 70–99)
Potassium: 3.8 mmol/L (ref 3.5–5.1)
Sodium: 136 mmol/L (ref 135–145)
Total Bilirubin: 0.7 mg/dL (ref 0.0–1.2)
Total Protein: 6.6 g/dL (ref 6.5–8.1)

## 2023-04-19 LAB — RESP PANEL BY RT-PCR (RSV, FLU A&B, COVID)  RVPGX2
Influenza A by PCR: NEGATIVE
Influenza B by PCR: NEGATIVE
Resp Syncytial Virus by PCR: NEGATIVE
SARS Coronavirus 2 by RT PCR: NEGATIVE

## 2023-04-19 LAB — URINALYSIS, ROUTINE W REFLEX MICROSCOPIC
Bacteria, UA: NONE SEEN
Bilirubin Urine: NEGATIVE
Glucose, UA: NEGATIVE mg/dL
Ketones, ur: NEGATIVE mg/dL
Leukocytes,Ua: NEGATIVE
Nitrite: NEGATIVE
Protein, ur: NEGATIVE mg/dL
Specific Gravity, Urine: 1.01 (ref 1.005–1.030)
pH: 7 (ref 5.0–8.0)

## 2023-04-19 LAB — RAPID URINE DRUG SCREEN, HOSP PERFORMED
Amphetamines: NOT DETECTED
Barbiturates: NOT DETECTED
Benzodiazepines: POSITIVE — AB
Cocaine: NOT DETECTED
Opiates: NOT DETECTED
Tetrahydrocannabinol: NOT DETECTED

## 2023-04-19 LAB — PREGNANCY, URINE: Preg Test, Ur: NEGATIVE

## 2023-04-19 NOTE — ED Provider Notes (Signed)
[place] EMERGENCY DEPARTMENT AT [facility] Provider Note   CSN: [id number] Arrival date & time: 04/19/23  6962     History  No chief complaint on file.   Diane Norton is a 38 y.o. female.  She is brought in by Patent examiner.  She has been incarcerated since Monday.  She reports that she swallowed 1 g of heroin in a baggy 4 days ago.  She denies it was an attempt to hurt herself.  She has not seen the baggie pass in her stool but she has not been checking either.  She also last used heroin 4 days ago.  She is complaining of some vomiting and diarrhea.  She thinks its withdrawal symptoms from using.  No unresponsive episodes.  No fever or abdominal pain.  Chronic cough.  She said she was admitted back in November for pneumonia and elevated troponins.  The history is provided by the patient and the police.  Emesis Severity:  Moderate Duration:  2 days Timing:  Intermittent Quality:  Stomach contents Progression:  Unchanged Chronicity:  Recurrent Relieved by:  None tried Associated symptoms: cough, diarrhea and myalgias   Associated symptoms: no abdominal pain and no fever        Home Medications Prior to Admission medications   Medication Sig Start Date End Date Taking? Authorizing Provider  acetaminophen (TYLENOL) 325 MG tablet Take 2 tablets (650 mg total) by mouth every 6 (six) hours as needed for mild pain (or Fever >/= 101). 11/05/19   Emokpae, Courage, MD  guaiFENesin (MUCINEX) 600 MG 12 hr tablet Take 1 tablet (600 mg total) by mouth 2 (two) times daily. 11/05/19   Shon Hale, MD  traZODone (DESYREL) 150 MG tablet Take 1 tablet (150 mg total) by mouth at bedtime. 11/05/19   Shon Hale, MD      Allergies    Bactrim [sulfamethoxazole-trimethoprim], Darvocet [propoxyphene n-acetaminophen], Other, Penicillins, Bactrim [sulfamethoxazole-trimethoprim], and Ciprofloxacin    Review of Systems   Review of Systems  Constitutional:  Negative for fever.   Eyes:  Negative for visual disturbance.  Respiratory:  Positive for cough.   Cardiovascular:  Negative for chest pain.  Gastrointestinal:  Positive for diarrhea and vomiting. Negative for abdominal pain.  Genitourinary:  Negative for dysuria.  Musculoskeletal:  Positive for myalgias.    Physical Exam Updated Vital Signs BP 98/71   Pulse (!) 59   Resp 19   LMP 04/07/2023   SpO2 95%  Physical Exam Vitals and nursing note reviewed.  Constitutional:      General: She is not in acute distress.    Appearance: Normal appearance. She is well-developed.  HENT:     Head: Normocephalic and atraumatic.  Eyes:     Conjunctiva/sclera: Conjunctivae normal.  Cardiovascular:     Rate and Rhythm: Normal rate and regular rhythm.     Heart sounds: No murmur heard. Pulmonary:     Effort: Pulmonary effort is normal. No respiratory distress.     Breath sounds: Normal breath sounds.  Abdominal:     Palpations: Abdomen is soft.     Tenderness: There is no abdominal tenderness. There is no guarding or rebound.  Musculoskeletal:        General: No deformity.     Cervical back: Neck supple.  Skin:    General: Skin is warm and dry.     Capillary Refill: Capillary refill takes less than 2 seconds.  Neurological:     General: No focal deficit present.  Mental Status: She is alert.     ED Results / Procedures / Treatments   Labs (all labs ordered are listed, but only abnormal results are displayed) Labs Reviewed  COMPREHENSIVE METABOLIC PANEL - Abnormal; Notable for the following components:      Result Value   Calcium 8.8 (*)    All other components within normal limits  URINALYSIS, ROUTINE W REFLEX MICROSCOPIC - Abnormal; Notable for the following components:   Hgb urine dipstick SMALL (*)    All other components within normal limits  RAPID URINE DRUG SCREEN, HOSP PERFORMED - Abnormal; Notable for the following components:   Benzodiazepines POSITIVE (*)    All other components within  normal limits  RESP PANEL BY RT-PCR (RSV, FLU A&B, COVID)  RVPGX2  CBC WITH DIFFERENTIAL/PLATELET  PREGNANCY, URINE    EKG EKG Interpretation Date/Time:  Friday April 19 2023 09:07:13 EST Ventricular Rate:  76 PR Interval:  168 QRS Duration:  91 QT Interval:  421 QTC Calculation: 474 R Axis:   67  Text Interpretation: Sinus rhythm RSR' in V1 or V2, probably normal variant Confirmed by Meridee Score [phone]) on 04/19/2023 9:09:59 AM  Radiology DG Abd Acute W/Chest Result Date: 04/19/2023 CLINICAL DATA:  Vomiting and diarrhea. EXAM: DG ABDOMEN ACUTE WITH 1 VIEW CHEST COMPARISON:  None Available. FINDINGS: The bowel gas pattern is non-obstructive. No evidence of pneumoperitoneum. Bilateral lung fields are clear. Bilateral costophrenic angles are clear. Normal cardio-mediastinal silhouette. No acute osseous abnormalities. The soft tissues are within normal limits. Surgical changes, devices, tubes and lines: There are surgical clips in the right upper quadrant, typical of a previous cholecystectomy. No radiopaque foreign bodies. IMPRESSION: *Negative abdominal radiographs.  No acute cardiopulmonary disease. *No radiopaque foreign bodies. Electronically Signed   By: Jules Schick M.D.   On: 04/19/2023 10:04    Procedures Procedures    Medications Ordered in ED Medications - No data to display  ED Course/ Medical Decision Making/ A&P Clinical Course as of 04/19/23 1711  Fri Apr 19, 2023  1116 Patient is experienced no symptoms while she has been here.  Her workup has been negative.  I reviewed her workup with her and feel she can be safely returned back to jail where they can continue to observe her. [MB]    Clinical Course User Index [MB] Terrilee Files, MD                                 Medical Decision Making Amount and/or Complexity of Data Reviewed Labs: ordered. Radiology: ordered.   This patient complains of nausea vomiting diarrhea; this involves an extensive  number of treatment Options and is a complaint that carries with it a high risk of complications and morbidity. The differential includes drug intoxication, drug withdrawal, obstruction, intoxication, overdose, metabolic derangement I ordered, reviewed and interpreted labs, which included CBC normal chemistries normal urinalysis negative talk screen positive for benzos COVID and flu negative   I ordered imaging studies which included x-rays chest abdomen pelvis and I independently    visualized and interpreted imaging which showed no acute findings Additional history obtained from law enforcement Previous records obtained and reviewed in epic no recent admissions Cardiac monitoring reviewed, sinus rhythm Social determinants considered, tobacco use Critical Interventions: None  After the interventions stated above, I reevaluated the patient and found patient to be tolerating p.o. in no distress Admission and further testing considered, no indications for  admission or further workup at this time.  Symptomatic and observational treatment indicated.  Return instructions discussed         Final Clinical Impression(s) / ED Diagnoses Final diagnoses:  Vomiting and diarrhea  Purposeful non-suicidal drug ingestion, initial encounter [facility])    Rx / DC Orders ED Discharge Orders     None         Terrilee Files, MD 04/19/23 1714

## 2023-04-19 NOTE — Discharge Instructions (Signed)
You were seen in the emergency department for evaluation of heroin ingestion and vomiting and diarrhea.  You had lab work and x-rays that did not show any evidence of retained foreign body.  Please continue to observe your symptoms and return if any worsening or concerning symptoms.  Imodium may help your diarrhea symptoms.

## 2023-04-19 NOTE — ED Triage Notes (Signed)
Pt BIB by [employer] from jail; pt told officer she swallowed approximately a 1GM bag of heroin on Monday night; pt states she is now having vomiting and diarrhea   Pt states she has not looked at her BMs to see if she has passed the foreign body but thinks she is going through DT's

## 2023-08-14 ENCOUNTER — Other Ambulatory Visit: Payer: Self-pay | Admitting: Family Medicine

## 2023-08-15 ENCOUNTER — Ambulatory Visit: Admitting: Family Medicine

## 2023-08-15 ENCOUNTER — Encounter: Payer: Self-pay | Admitting: Family Medicine

## 2023-08-15 VITALS — BP 100/64 | HR 57 | Resp 18 | Ht 65.0 in | Wt 154.0 lb

## 2023-08-15 DIAGNOSIS — R7301 Impaired fasting glucose: Secondary | ICD-10-CM

## 2023-08-15 DIAGNOSIS — Z136 Encounter for screening for cardiovascular disorders: Secondary | ICD-10-CM | POA: Insufficient documentation

## 2023-08-15 DIAGNOSIS — E559 Vitamin D deficiency, unspecified: Secondary | ICD-10-CM | POA: Diagnosis not present

## 2023-08-15 DIAGNOSIS — R0602 Shortness of breath: Secondary | ICD-10-CM

## 2023-08-15 DIAGNOSIS — Z1159 Encounter for screening for other viral diseases: Secondary | ICD-10-CM

## 2023-08-15 DIAGNOSIS — G4700 Insomnia, unspecified: Secondary | ICD-10-CM | POA: Insufficient documentation

## 2023-08-15 DIAGNOSIS — F5102 Adjustment insomnia: Secondary | ICD-10-CM

## 2023-08-15 DIAGNOSIS — E038 Other specified hypothyroidism: Secondary | ICD-10-CM

## 2023-08-15 DIAGNOSIS — E782 Mixed hyperlipidemia: Secondary | ICD-10-CM

## 2023-08-15 MED ORDER — DOXEPIN HCL 25 MG PO CAPS: 25.0000 mg | ORAL_CAPSULE | Freq: Every evening | ORAL | 2 refills | Status: DC

## 2023-08-15 NOTE — Progress Notes (Deleted)
   New Patient Office Visit   Subjective   Patient ID: Diane Norton, female    DOB: 1985-05-13  Age: 38 y.o. MRN: [id number]  CC: No chief complaint on file.   HPI APRILE DICKENSON 38 year old female, presents to establish care. She  has a past medical history of Asthma and Seizures (HCC).  HPI    Outpatient Encounter Medications as of 08/15/2023  Medication Sig   acetaminophen  (TYLENOL ) 325 MG tablet Take 2 tablets (650 mg total) by mouth every 6 (six) hours as needed for mild pain (or Fever >/= 101).   guaiFENesin  (MUCINEX ) 600 MG 12 hr tablet Take 1 tablet (600 mg total) by mouth 2 (two) times daily.   traZODone  (DESYREL ) 150 MG tablet Take 1 tablet (150 mg total) by mouth at bedtime.   No facility-administered encounter medications on file as of 08/15/2023.    Past Surgical History:  Procedure Laterality Date   CHOLECYSTECTOMY     FEMUR IM NAIL Right 01/09/2015   Procedure: INTRAMEDULLARY (IM) RETROGRADE FEMORAL NAILING; RIGHT;  Surgeon: Timothy Ford, MD;  Location: [facility] OR;  Service: Orthopedics;  Laterality: Right;   ORIF PATELLA Right 01/09/2015   Procedure: OPEN REDUCTION INTERNAL (ORIF) FIXATION PATELLA; RIGHT;  Surgeon: Timothy Ford, MD;  Location: [facility] OR;  Service: Orthopedics;  Laterality: Right;    ROS    Objective    There were no vitals taken for this visit.  Physical Exam    Assessment & Plan:  There are no diagnoses linked to this encounter.  No follow-ups on file.   Avelino Lek Amber Bail, FNP

## 2023-08-15 NOTE — Patient Instructions (Signed)

## 2023-08-15 NOTE — Assessment & Plan Note (Signed)

## 2023-08-15 NOTE — Assessment & Plan Note (Signed)
 Trial on Doxepin 25 mg at bedtime  Explained to go to bed at the same time each night and get up at the same time each morning, including on the weekends. Make sure your bedroom is quiet, dark, relaxing, and at a comfortable temperature. Remove electronic devices, such as TVs, computers, and smart phones, from the bedroom.

## 2023-08-15 NOTE — Progress Notes (Signed)
 Established Patient Office Visit   Subjective  Patient ID: Diane Norton, female    DOB: 1986-01-10  Age: 38 y.o. MRN: [id number]  Chief Complaint  Patient presents with   Establish Care    She  has a past medical history of Asthma, COPD (chronic obstructive pulmonary disease) (HCC), Emphysema of lung (HCC), Pneumonia, Pulmonary embolism (HCC), and Seizures (HCC).  HPI Insomnia  The patient reports primary sleep disturbances, including difficulty falling asleep, occurring nightly. Symptoms have been waxing and waning since onset and are aggravated by anxiety and chronic pain. She has previously tried hydroxyzine , trazodone , and melatonin, with limited or no relief. She typically goes to bed between 10:00-11:00 P.M., but reports taking over an hour to fall asleep. Symptoms improve somewhat in a dark, quiet environment. The patient consumes 2-3 caffeinated beverages daily, primarily soda, which may contribute to her sleep difficulties. Past medical history is significant for chronic pain and family-related stress/anxiety, which appear to be contributing factors to her insomnia.  Review of Systems  Constitutional:  Negative for chills and fever.  Eyes:  Negative for blurred vision.  Respiratory:  Positive for shortness of breath.   Cardiovascular:  Negative for chest pain.  Gastrointestinal:  Negative for abdominal pain.  Genitourinary:  Negative for dysuria.  Psychiatric/Behavioral:  The patient is nervous/anxious and has insomnia.       Objective:     BP 100/64 (BP Location: Left Arm, Patient Position: Sitting)   Pulse (!) 57   Resp 18   Ht 5' 5 (1.651 m)   Wt 154 lb (69.9 kg)   SpO2 96%   BMI 25.63 kg/m  BP Readings from Last 3 Encounters:  08/15/23 100/64  04/19/23 98/71  01/24/23 (!) 126/97      Physical Exam Vitals reviewed.  Constitutional:      General: She is not in acute distress.    Appearance: Normal appearance. She is not ill-appearing, toxic-appearing  or diaphoretic.  HENT:     Head: Normocephalic.   Eyes:     General:        Right eye: No discharge.        Left eye: No discharge.     Conjunctiva/sclera: Conjunctivae normal.    Cardiovascular:     Rate and Rhythm: Normal rate.     Pulses: Normal pulses.     Heart sounds: Normal heart sounds.  Pulmonary:     Effort: Pulmonary effort is normal. No respiratory distress.     Breath sounds: Normal breath sounds.  Abdominal:     General: Bowel sounds are normal.     Tenderness: There is no right CVA tenderness.   Skin:    General: Skin is warm and dry.     Capillary Refill: Capillary refill takes less than 2 seconds.   Neurological:     Mental Status: She is alert.     Coordination: Coordination normal.     Gait: Gait normal.   Psychiatric:        Mood and Affect: Mood normal.        Behavior: Behavior normal.      No results found for any visits on 08/15/23.  The ASCVD Risk score (Arnett DK, et al., 2019) failed to calculate for the following reasons:   The 2019 ASCVD risk score is only valid for ages 55 to 43   Risk score cannot be calculated because patient has a medical history suggesting prior/existing ASCVD    Assessment & Plan:  Mixed  hyperlipidemia -     Lipid panel -     CMP14+EGFR -     CBC with Differential/Platelet  Vitamin D deficiency -     VITAMIN D 25 Hydroxy (Vit-D Deficiency, Fractures)  IFG (impaired fasting glucose) -     Hemoglobin A1c  TSH (thyroid-stimulating hormone deficiency) -     TSH + free T4  Need for hepatitis C screening test -     Hepatitis C antibody  SOB (shortness of breath) -     Brain natriuretic peptide  Adjustment insomnia Assessment & Plan: Trial on Doxepin 25 mg at bedtime  Explained to go to bed at the same time each night and get up at the same time each morning, including on the weekends. Make sure your bedroom is quiet, dark, relaxing, and at a comfortable temperature. Remove electronic devices, such as  TVs, computers, and smart phones, from the bedroom.    Encounter for screening for cardiovascular disorders Assessment & Plan: A comprehensive physical examination was completed, and necessary labs were ordered. Screening and health maintenance recommendations have been updated. The patient received counseling on exercise and nutrition. BMI was assessed and discussed Advise for heart health, focus on: Eat more fruits and vegetables: Aim for a variety of colors. Choose whole grains: Brown rice, oats, and whole-wheat bread. Limit unhealthy fats: Avoid trans fats; use olive or avocado oil instead. Include lean proteins: Opt for fish, chicken, beans, and legumes. Reduce sodium: Limit processed foods and add less salt. Stay hydrated: Drink plenty of water. Exercise regularly: Aim for at least 30 minutes of moderate exercise, like walking or cycling, 5 days a week.     Other orders -     Doxepin HCl; Take 1 capsule (25 mg total) by mouth at bedtime.  Dispense: 30 capsule; Refill: 2    Return in about 8 weeks (around 10/10/2023), or if symptoms worsen or fail to improve, for Pap smear, Insomnia.   Avelino Lek Amber Bail, FNP

## 2023-08-16 ENCOUNTER — Ambulatory Visit: Payer: Self-pay | Admitting: Family Medicine

## 2023-08-17 LAB — CMP14+EGFR
ALT: 7 IU/L (ref 0–32)
AST: 12 IU/L (ref 0–40)
Albumin: 4.3 g/dL (ref 3.9–4.9)
Alkaline Phosphatase: 89 IU/L (ref 44–121)
BUN/Creatinine Ratio: 9 (ref 9–23)
BUN: 7 mg/dL (ref 6–20)
Bilirubin Total: 0.2 mg/dL (ref 0.0–1.2)
CO2: 20 mmol/L (ref 20–29)
Calcium: 8.8 mg/dL (ref 8.7–10.2)
Chloride: 103 mmol/L (ref 96–106)
Creatinine, Ser: 0.74 mg/dL (ref 0.57–1.00)
Globulin, Total: 2.6 g/dL (ref 1.5–4.5)
Glucose: 85 mg/dL (ref 70–99)
Potassium: 4 mmol/L (ref 3.5–5.2)
Sodium: 137 mmol/L (ref 134–144)
Total Protein: 6.9 g/dL (ref 6.0–8.5)
eGFR: 106 mL/min/{1.73_m2} (ref >59)

## 2023-08-17 LAB — LIPID PANEL
Chol/HDL Ratio: 4.8 ratio — ABNORMAL HIGH (ref 0.0–4.4)
Cholesterol, Total: 187 mg/dL (ref 100–199)
HDL: 39 mg/dL — ABNORMAL LOW (ref >39)
LDL Chol Calc (NIH): 133 mg/dL — ABNORMAL HIGH (ref 0–99)
Triglycerides: 80 mg/dL (ref 0–149)
VLDL Cholesterol Cal: 15 mg/dL (ref 5–40)

## 2023-08-17 LAB — HEMOGLOBIN A1C
Est. average glucose Bld gHb Est-mCnc: 103 mg/dL
Hgb A1c MFr Bld: 5.2 % (ref 4.8–5.6)

## 2023-08-17 LAB — CBC WITH DIFFERENTIAL/PLATELET
Basophils Absolute: 0.1 10*3/uL (ref 0.0–0.2)
Basos: 1 %
EOS (ABSOLUTE): 0.1 10*3/uL (ref 0.0–0.4)
Eos: 1 %
Hematocrit: 45.9 % (ref 34.0–46.6)
Hemoglobin: 15.2 g/dL (ref 11.1–15.9)
Immature Grans (Abs): 0 10*3/uL (ref 0.0–0.1)
Immature Granulocytes: 0 %
Lymphocytes Absolute: 3.6 10*3/uL — ABNORMAL HIGH (ref 0.7–3.1)
Lymphs: 43 %
MCH: 32.2 pg (ref 26.6–33.0)
MCHC: 33.1 g/dL (ref 31.5–35.7)
MCV: 97 fL (ref 79–97)
Monocytes Absolute: 0.6 10*3/uL (ref 0.1–0.9)
Monocytes: 7 %
Neutrophils Absolute: 4 10*3/uL (ref 1.4–7.0)
Neutrophils: 48 %
Platelets: 259 10*3/uL (ref 150–450)
RBC: 4.72 x10E6/uL (ref 3.77–5.28)
RDW: 11.7 % (ref 11.7–15.4)
WBC: 8.3 10*3/uL (ref 3.4–10.8)

## 2023-08-17 LAB — TSH+FREE T4
Free T4: 1.35 ng/dL (ref 0.82–1.77)
TSH: 0.603 u[IU]/mL (ref 0.450–4.500)

## 2023-08-17 LAB — HEPATITIS C ANTIBODY: Hep C Virus Ab: NONREACTIVE

## 2023-08-17 LAB — VITAMIN D 25 HYDROXY (VIT D DEFICIENCY, FRACTURES): Vit D, 25-Hydroxy: 33.6 ng/mL (ref 30.0–100.0)

## 2023-08-17 LAB — BRAIN NATRIURETIC PEPTIDE: BNP: 19.8 pg/mL (ref 0.0–100.0)

## 2023-09-12 ENCOUNTER — Other Ambulatory Visit: Payer: Self-pay

## 2023-09-12 ENCOUNTER — Encounter (HOSPITAL_COMMUNITY): Payer: Self-pay

## 2023-09-12 ENCOUNTER — Emergency Department (HOSPITAL_COMMUNITY)

## 2023-09-12 ENCOUNTER — Emergency Department (HOSPITAL_COMMUNITY)
Admission: EM | Admit: 2023-09-12 | Discharge: 2023-09-12 | Disposition: A | Discharge status: Home / Self Care | Attending: Emergency Medicine | Admitting: Emergency Medicine

## 2023-09-12 DIAGNOSIS — S46911A Strain of unspecified muscle, fascia and tendon at shoulder and upper arm level, right arm, initial encounter: Secondary | ICD-10-CM

## 2023-09-12 DIAGNOSIS — M25511 Pain in right shoulder: Secondary | ICD-10-CM | POA: Diagnosis present

## 2023-09-12 DIAGNOSIS — J45909 Unspecified asthma, uncomplicated: Secondary | ICD-10-CM | POA: Diagnosis not present

## 2023-09-12 DIAGNOSIS — R202 Paresthesia of skin: Secondary | ICD-10-CM | POA: Diagnosis not present

## 2023-09-12 DIAGNOSIS — M542 Cervicalgia: Secondary | ICD-10-CM | POA: Insufficient documentation

## 2023-09-12 MED ORDER — KETOROLAC TROMETHAMINE 15 MG/ML IJ SOLN
15.0000 mg | Freq: Once | INTRAMUSCULAR | Status: AC
  Administered 2023-09-12: 15 mg via INTRAMUSCULAR
  Filled 2023-09-12: qty 1

## 2023-09-12 MED ORDER — DEXAMETHASONE SODIUM PHOSPHATE 10 MG/ML IJ SOLN
10.0000 mg | Freq: Once | INTRAMUSCULAR | Status: AC
  Administered 2023-09-12: 10 mg via INTRAMUSCULAR
  Filled 2023-09-12: qty 1

## 2023-09-12 MED ORDER — METHOCARBAMOL 500 MG PO TABS
500.0000 mg | ORAL_TABLET | Freq: Once | ORAL | Status: AC
Start: 2023-09-12 — End: 2023-09-12
  Administered 2023-09-12: 500 mg via ORAL
  Filled 2023-09-12: qty 1

## 2023-09-12 MED ORDER — LIDOCAINE 5 % EX PTCH
1.0000 | MEDICATED_PATCH | CUTANEOUS | Status: DC
Start: 2023-09-12 — End: 2023-09-12
  Administered 2023-09-12: 1 via TRANSDERMAL
  Filled 2023-09-12: qty 1

## 2023-09-12 MED ORDER — METHOCARBAMOL 500 MG PO TABS: 500.0000 mg | ORAL_TABLET | Freq: Four times a day (QID) | ORAL | 0 refills | Status: DC | PRN

## 2023-09-12 MED ORDER — DEXAMETHASONE SODIUM PHOSPHATE 10 MG/ML IJ SOLN
10.0000 mg | Freq: Once | INTRAMUSCULAR | Status: DC
Start: 2023-09-12 — End: 2023-09-12

## 2023-09-12 MED ORDER — LIDOCAINE 5 % EX PTCH
1.0000 | MEDICATED_PATCH | CUTANEOUS | 0 refills | Status: AC
Start: 2023-09-12 — End: ?

## 2023-09-12 MED ORDER — MELOXICAM 7.5 MG PO TABS
7.5000 mg | ORAL_TABLET | Freq: Every day | ORAL | 0 refills | Status: AC
Start: 2023-09-12 — End: ?

## 2023-09-12 NOTE — ED Provider Notes (Addendum)
 [place] [facility] DEPARTMENT AT [facility] Provider Note   CSN: [id number] Arrival date & time: 09/12/23  1109     Patient presents with: Shoulder Pain   Diane Norton is a 38 y.o. female.  She is history of asthma, pneumonia, seizures, QTc prolongation.  She presents the ER today for evaluation of right posterior shoulder pain x 4 days.  States it is in the right side of her neck shoulder and goes down her arm.  She denies fever or chills, unintentional weight loss, chest pain, shortness of breath, midline neck pain, IVDA, chronic steroid use, no saddle anesthesia or paresthesia, no bowel or bladder incontinence, she has had some intermittent tingling in her deltoid, forearm and fingertips though this is only with certain positions.    Shoulder Pain      Prior to Admission medications   Medication Sig Start Date End Date Taking? Authorizing Provider  acetaminophen  (TYLENOL ) 325 MG tablet Take 2 tablets (650 mg total) by mouth every 6 (six) hours as needed for mild pain (or Fever >/= 101). 11/05/19   Emokpae, Courage, MD  doxepin  (SINEQUAN ) 25 MG capsule Take 1 capsule (25 mg total) by mouth at bedtime. 08/15/23   Del Wilhelmena Lloyd Sola, FNP  guaiFENesin  (MUCINEX ) 600 MG 12 hr tablet Take 1 tablet (600 mg total) by mouth 2 (two) times daily. 11/05/19   Pearlean Manus, MD    Allergies: Bactrim [sulfamethoxazole-trimethoprim], Darvocet [propoxyphene n-acetaminophen ], Other, Penicillins, Bactrim [sulfamethoxazole-trimethoprim], and Ciprofloxacin     Review of Systems  Updated Vital Signs BP 114/73 (BP Location: Left Arm)   Pulse 77   Temp 98.3 F (36.8 C) (Oral)   Resp 20   Ht 5' 5 (1.651 m)   Wt 69.9 kg   LMP 08/25/2023 (Approximate)   SpO2 95%   BMI 25.64 kg/m   Physical Exam Vitals and nursing note reviewed.  Constitutional:      General: She is not in acute distress.    Appearance: She is well-developed.  HENT:     Head: Normocephalic and atraumatic.      Mouth/Throat:     Mouth: Mucous membranes are moist.  Eyes:     Extraocular Movements: Extraocular movements intact.     Conjunctiva/sclera: Conjunctivae normal.     Pupils: Pupils are equal, round, and reactive to light.  Neck:     Comments: Spurling test negative Cardiovascular:     Rate and Rhythm: Normal rate and regular rhythm.     Heart sounds: No murmur heard. Pulmonary:     Effort: Pulmonary effort is normal. No respiratory distress.     Breath sounds: Normal breath sounds.  Abdominal:     Palpations: Abdomen is soft.     Tenderness: There is no abdominal tenderness.  Musculoskeletal:        General: No swelling.     Cervical back: Neck supple.     Comments: Point tenderness to right trapezius, normal range of motion to right shoulder though position of comfort is with left arm raised above her head.  Pain increases with arm at her side.  Her radial pulses intact, sensation intact throughout the entire upper extremity on the right.  No bony tenderness to right shoulder.  Skin:    General: Skin is warm and dry.     Capillary Refill: Capillary refill takes less than 2 seconds.  Neurological:     General: No focal deficit present.     Mental Status: She is alert and oriented to  person, place, and time.  Psychiatric:        Mood and Affect: Mood normal.     (all labs ordered are listed, but only abnormal results are displayed) Labs Reviewed - No data to display  EKG: None  Radiology: DG Shoulder Right Result Date: 09/12/2023 CLINICAL DATA:  Right shoulder pain.  No trauma. EXAM: RIGHT SHOULDER - 2+ VIEW COMPARISON:  None Available. FINDINGS: No acute fracture or dislocation. The bones are well mineralized. No significant arthritic changes. The soft tissues are unremarkable. IMPRESSION: Negative. Electronically Signed   By: Vanetta Chou M.D.   On: 09/12/2023 13:12     Procedures   Medications Ordered in the ED  lidocaine  (LIDODERM ) 5 % 1 patch (1 patch  Transdermal Patch Applied 09/12/23 1319)  dexamethasone  (DECADRON ) injection 10 mg (has no administration in time range)  ketorolac  (TORADOL ) 15 MG/ML injection 15 mg (15 mg Intramuscular Given 09/12/23 1319)  methocarbamol  (ROBAXIN ) tablet 500 mg (500 mg Oral Given 09/12/23 1318)                                    Medical Decision Making This patient presents to the ED for concern of right posterior shoulder pain, this involves an extensive number of treatment options, and is a complaint that carries with it a high risk of complications and morbidity.  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  Tenderness is primarily over the right trapezius there is no midline tenderness no limitation of range of motion of the neck or shoulder the patient has improvement of her pain with raising the arm above her head.  She does have some intermittent tingling in the right arm though no weakness, no DVT sensation on exam or tingling at this time, it seems to be positional per the patient.  She was given Toradol  lidocaine  patch with minimal improvement, will give steroids in case of possible nerve impingement, will avoid opiates but plan to discharge home with muscle laxer's, NSAIDs and close outpatient follow-up with  orthopedics.  At this time I do not suspect infectious cause.  She is not having chest pain or shortness of breath to suggest that this is an anginal equivalent, no pleuritic type pain to suggest PE as a cause of this posterior shoulder pain.  She has normal vitals, she is agreeable with plan of care and discharge.  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  I ordered medication including as above Reevaluation of the patient after these medicines showed that the patient improved mildly I have reviewed the patients home medicines and have made adjustments as needed    Test /  Admission - Considered:  No indication for admission advised on follow-up and return precautions.    Amount and/or Complexity of Data Reviewed Radiology: ordered.  Risk Prescription drug management.        Final diagnoses:  None    ED Discharge Orders     None          Suellen Sherran DELENA DEVONNA 09/12/23 [address] 09/12/23 1429    Suzette Pac, MD 09/14/23 [phone]

## 2023-09-12 NOTE — ED Triage Notes (Signed)
 Pt arrived via POV c/o right neck, shoulder and arm pain X 4 days. Pt denies injury. Pt reports numbness and tingling to her fingers.

## 2023-09-12 NOTE — Discharge Instructions (Signed)
 It was a pleasure taking care of you today.  You were evaluated in the ER for right shoulder pain ongoing for the past 4 days.  Your exam and x-ray are reassuring.  I feel you likely pulled a muscle, please rest the arm, follow-up with orthopedics but come back to the ER if you have new or worsening symptoms, especially severe pain, fever, numbness or weakness.

## 2023-10-11 ENCOUNTER — Ambulatory Visit: Admitting: Family Medicine

## 2023-11-22 ENCOUNTER — Other Ambulatory Visit: Payer: Self-pay | Admitting: Family Medicine

## 2023-11-22 MED ORDER — DOXEPIN HCL 25 MG PO CAPS: 25.0000 mg | ORAL_CAPSULE | Freq: Every evening | ORAL | 2 refills | Status: AC

## 2023-11-22 NOTE — Telephone Encounter (Signed)
 Copied from CRM [id number]. Topic: Clinical - Medication Refill >> Nov 22, 2023  1:58 PM Rosaria E wrote: Medication: doxepin  (SINEQUAN ) 25 MG capsule   Has the patient contacted their pharmacy? Yes (Agent: If no, request that the patient contact the pharmacy for the refill. If patient does not wish to contact the pharmacy document the reason why and proceed with request.) (Agent: If yes, when and what did the pharmacy advise?)  This is the patient's preferred pharmacy:  [facility] - [place], [place] - [address] [place] [address] Phone: [phone] Fax: [phone]  Is this the correct pharmacy for this prescription? Yes If no, delete pharmacy and type the correct one.   Has the prescription been filled recently? Yes  Is the patient out of the medication? Yes  Has the patient been seen for an appointment in the last year OR does the patient have an upcoming appointment? Yes  Can we respond through MyChart? Yes  Agent: Please be advised that Rx refills may take up to 3 business days. We ask that you follow-up with your pharmacy.

## 2023-12-13 ENCOUNTER — Other Ambulatory Visit: Payer: Self-pay | Admitting: Medical Genetics

## 2023-12-13 DIAGNOSIS — Z006 Encounter for examination for normal comparison and control in clinical research program: Secondary | ICD-10-CM

## 2024-02-18 ENCOUNTER — Telehealth

## 2024-02-19 ENCOUNTER — Telehealth

## 2024-02-19 ENCOUNTER — Telehealth: Admitting: Physician Assistant

## 2024-02-19 ENCOUNTER — Ambulatory Visit

## 2024-02-19 DIAGNOSIS — U071 COVID-19: Secondary | ICD-10-CM

## 2024-02-19 MED ORDER — FLUTICASONE PROPIONATE 50 MCG/ACT NA SUSP: 2.0000 | Freq: Every day | NASAL | 0 refills | Status: AC

## 2024-02-19 MED ORDER — ALBUTEROL SULFATE HFA 108 (90 BASE) MCG/ACT IN AERS: 1.0000 | INHALATION_SPRAY | Freq: Four times a day (QID) | RESPIRATORY_TRACT | 0 refills | Status: AC | PRN

## 2024-02-19 MED ORDER — PSEUDOEPH-BROMPHEN-DM 30-2-10 MG/5ML PO SYRP: 5.0000 mL | ORAL_SOLUTION | Freq: Four times a day (QID) | ORAL | 0 refills | Status: AC | PRN

## 2024-02-19 MED ORDER — NIRMATRELVIR/RITONAVIR (PAXLOVID)TABLET: 3.0000 | ORAL_TABLET | Freq: Two times a day (BID) | ORAL | 0 refills | Status: AC

## 2024-02-19 MED ORDER — ONDANSETRON 4 MG PO TBDP: 4.0000 mg | ORAL_TABLET | Freq: Three times a day (TID) | ORAL | 0 refills | Status: AC | PRN

## 2024-02-19 NOTE — Patient Instructions (Signed)
 Olam CHRISTELLA Piety, thank you for joining Delon CHRISTELLA Dickinson, PA-C for today's virtual visit.  While this provider is not your primary care provider (PCP), if your PCP is located in our provider database this encounter information will be shared with them immediately following your visit.   A [facility] MyChart account gives you access to today's visit and all your visits, tests, and labs performed at [facility]  click here if you don't have a [facility] MyChart account or go to [email or web address]  Consent: (Patient) JULITA OZBUN provided verbal consent for this virtual visit at the beginning of the encounter.  Current Medications:  Current Outpatient Medications:    albuterol  (VENTOLIN  HFA) 108 (90 Base) MCG/ACT inhaler, Inhale 1-2 puffs into the lungs every 6 (six) hours as needed., Disp: 8 g, Rfl: 0   brompheniramine-pseudoephedrine-DM 30-2-10 MG/5ML syrup, Take 5 mLs by mouth 4 (four) times daily as needed., Disp: 120 mL, Rfl: 0   fluticasone  (FLONASE ) 50 MCG/ACT nasal spray, Place 2 sprays into both nostrils daily., Disp: 16 g, Rfl: 0   nirmatrelvir /ritonavir  (PAXLOVID ) 20 x 150 MG & 10 x 100MG  TABS, Take 3 tablets by mouth 2 (two) times daily for 5 days. (Take nirmatrelvir  150 mg two tablets twice daily for 5 days and ritonavir  100 mg one tablet twice daily for 5 days) Patient GFR is 106, Disp: 30 tablet, Rfl: 0   ondansetron  (ZOFRAN -ODT) 4 MG disintegrating tablet, Take 1 tablet (4 mg total) by mouth every 8 (eight) hours as needed., Disp: 20 tablet, Rfl: 0   doxepin  (SINEQUAN ) 25 MG capsule, Take 1 capsule (25 mg total) by mouth at bedtime., Disp: 30 capsule, Rfl: 2   Medications ordered in this encounter:  Meds ordered this encounter  Medications   nirmatrelvir /ritonavir  (PAXLOVID ) 20 x 150 MG & 10 x 100MG  TABS    Sig: Take 3 tablets by mouth 2 (two) times daily for 5 days. (Take nirmatrelvir  150 mg two tablets twice daily for 5 days and ritonavir  100 mg one  tablet twice daily for 5 days) Patient GFR is 106    Dispense:  30 tablet    Refill:  0    Supervising Provider:   BLAISE ALEENE KIDD [[id number]]   albuterol  (VENTOLIN  HFA) 108 (90 Base) MCG/ACT inhaler    Sig: Inhale 1-2 puffs into the lungs every 6 (six) hours as needed.    Dispense:  8 g    Refill:  0    Supervising Provider:   BLAISE ALEENE KIDD [[id number]]   brompheniramine-pseudoephedrine-DM 30-2-10 MG/5ML syrup    Sig: Take 5 mLs by mouth 4 (four) times daily as needed.    Dispense:  120 mL    Refill:  0    Supervising Provider:   LAMPTEY, PHILIP O [id number]   fluticasone  (FLONASE ) 50 MCG/ACT nasal spray    Sig: Place 2 sprays into both nostrils daily.    Dispense:  16 g    Refill:  0    Supervising Provider:   BLAISE ALEENE KIDD [[id number]]   ondansetron  (ZOFRAN -ODT) 4 MG disintegrating tablet    Sig: Take 1 tablet (4 mg total) by mouth every 8 (eight) hours as needed.    Dispense:  20 tablet    Refill:  0    Supervising Provider:   LAMPTEY, PHILIP O [[id number]]     *If you need refills on other medications prior to your next appointment, please contact your pharmacy*  Follow-Up: Call back or seek an in-person evaluation if  the symptoms worsen or if the condition fails to improve as anticipated.  [facility] [phone]  Care Instructions: Can take to lessen severity (if able): Vit C 500mg  twice daily Quercertin 250-500mg  twice daily Zinc 75-100mg  daily Melatonin 3-6 mg at bedtime Vit D3 1000-2000 IU daily Aspirin  81 mg daily with food Optional: Famotidine  20mg  daily Also can add tylenol /ibuprofen as needed for fevers and body aches May add Mucinex  or Mucinex  DM as needed for cough/congestion   Isolation Instructions: You are to isolate at home until you have been fever free for at least 24 hours without a fever-reducing medication, and symptoms have been steadily improving for 24 hours. At that time,  you can end isolation but need to mask for an additional  5 days.   If you must be around other household members who do not have symptoms, you need to make sure that both you and the family members are masking consistently with a high-quality mask.  If you note any worsening of symptoms despite treatment, please seek an in-person evaluation ASAP. If you note any significant shortness of breath or any chest pain, please seek ER evaluation. Please do not delay care!   COVID-19: What to Do if You Are Sick If you test positive and are an older adult or someone who is at high risk of getting very sick from COVID-19, treatment may be available. Contact a healthcare provider right away after a positive test to determine if you are eligible, even if your symptoms are mild right now. You can also visit a Test to Treat location and, if eligible, receive a prescription from a provider. Don't delay: Treatment must be started within the first few days to be effective. If you have a fever, cough, or other symptoms, you might have COVID-19. Most people have mild illness and are able to recover at home. If you are sick: Keep track of your symptoms. If you have an emergency warning sign (including trouble breathing), call 911. Steps to help prevent the spread of COVID-19 if you are sick If you are sick with COVID-19 or think you might have COVID-19, follow the steps below to care for yourself and to help protect other people in your home and community. Stay home except to get medical care Stay home. Most people with COVID-19 have mild illness and can recover at home without medical care. Do not leave your home, except to get medical care. Do not visit public areas and do not go to places where you are unable to wear a mask. Take care of yourself. Get rest and stay hydrated. Take over-the-counter medicines, such as acetaminophen , to help you feel better. Stay in touch with your doctor. Call before you get medical care. Be sure to get care if you have trouble breathing, or  have any other emergency warning signs, or if you think it is an emergency. Avoid public transportation, ride-sharing, or taxis if possible. Get tested If you have symptoms of COVID-19, get tested. While waiting for test results, stay away from others, including staying apart from those living in your household. Get tested as soon as possible after your symptoms start. Treatments may be available for people with COVID-19 who are at risk for becoming very sick. Don't delay: Treatment must be started early to be effective--some treatments must begin within 5 days of your first symptoms. Contact your healthcare provider right away if your test result is positive to determine if you are eligible. Self-tests are one  of several options for testing for the virus that causes COVID-19 and may be more convenient than laboratory-based tests and point-of-care tests. Ask your healthcare provider or your local health department if you need help interpreting your test results. You can visit your state, tribal, local, and territorial health department's website to look for the latest local information on testing sites. Separate yourself from other people As much as possible, stay in a specific room and away from other people and pets in your home. If possible, you should use a separate bathroom. If you need to be around other people or animals in or outside of the home, wear a well-fitting mask. Tell your close contacts that they may have been exposed to COVID-19. An infected person can spread COVID-19 starting 48 hours (or 2 days) before the person has any symptoms or tests positive. By letting your close contacts know they may have been exposed to COVID-19, you are helping to protect everyone. See COVID-19 and Animals if you have questions about pets. If you are diagnosed with COVID-19, someone from the health department may call you. Answer the call to slow the spread. Monitor your symptoms Symptoms of COVID-19  include fever, cough, or other symptoms. Follow care instructions from your healthcare provider and local health department. Your local health authorities may give instructions on checking your symptoms and reporting information. When to seek emergency medical attention Look for emergency warning signs* for COVID-19. If someone is showing any of these signs, seek emergency medical care immediately: Trouble breathing Persistent pain or pressure in the chest New confusion Inability to wake or stay awake Pale, gray, or blue-colored skin, lips, or nail beds, depending on skin tone *This list is not all possible symptoms. Please call your medical provider for any other symptoms that are severe or concerning to you. Call 911 or call ahead to your local emergency facility: Notify the operator that you are seeking care for someone who has or may have COVID-19. Call ahead before visiting your doctor Call ahead. Many medical visits for routine care are being postponed or done by phone or telemedicine. If you have a medical appointment that cannot be postponed, call your doctor's office, and tell them you have or may have COVID-19. This will help the office protect themselves and other patients. If you are sick, wear a well-fitting mask You should wear a mask if you must be around other people or animals, including pets (even at home). Wear a mask with the best fit, protection, and comfort for you. You don't need to wear the mask if you are alone. If you can't put on a mask (because of trouble breathing, for example), cover your coughs and sneezes in some other way. Try to stay at least 6 feet away from other people. This will help protect the people around you. Masks should not be placed on young children under age 93 years, anyone who has trouble breathing, or anyone who is not able to remove the mask without help. Cover your coughs and sneezes Cover your mouth and nose with a tissue when you cough or  sneeze. Throw away used tissues in a lined trash can. Immediately wash your hands with soap and water for at least 20 seconds. If soap and water are not available, clean your hands with an alcohol-based hand sanitizer that contains at least 60% alcohol. Clean your hands often Wash your hands often with soap and water for at least 20 seconds. This is especially important after blowing  your nose, coughing, or sneezing; going to the bathroom; and before eating or preparing food. Use hand sanitizer if soap and water are not available. Use an alcohol-based hand sanitizer with at least 60% alcohol, covering all surfaces of your hands and rubbing them together until they feel dry. Soap and water are the best option, especially if hands are visibly dirty. Avoid touching your eyes, nose, and mouth with unwashed hands. Handwashing Tips Avoid sharing personal household items Do not share dishes, drinking glasses, cups, eating utensils, towels, or bedding with other people in your home. Wash these items thoroughly after using them with soap and water or put in the dishwasher. Clean surfaces in your home regularly Clean and disinfect high-touch surfaces (for example, doorknobs, tables, handles, light switches, and countertops) in your sick room and bathroom. In shared spaces, you should clean and disinfect surfaces and items after each use by the person who is ill. If you are sick and cannot clean, a caregiver or other person should only clean and disinfect the area around you (such as your bedroom and bathroom) on an as needed basis. Your caregiver/other person should wait as long as possible (at least several hours) and wear a mask before entering, cleaning, and disinfecting shared spaces that you use. Clean and disinfect areas that may have blood, stool, or body fluids on them. Use household cleaners and disinfectants. Clean visible dirty surfaces with household cleaners containing soap or detergent. Then,  use a household disinfectant. Use a product from [employer] List N: Disinfectants for Coronavirus (COVID-19). Be sure to follow the instructions on the label to ensure safe and effective use of the product. Many products recommend keeping the surface wet with a disinfectant for a certain period of time (look at contact time on the product label). You may also need to wear personal protective equipment, such as gloves, depending on the directions on the product label. Immediately after disinfecting, wash your hands with soap and water for 20 seconds. For completed guidance on cleaning and disinfecting your home, visit Complete Disinfection Guidance. Take steps to improve ventilation at home Improve ventilation (air flow) at home to help prevent from spreading COVID-19 to other people in your household. Clear out COVID-19 virus particles in the air by opening windows, using air filters, and turning on fans in your home. Use this interactive tool to learn how to improve air flow in your home. When you can be around others after being sick with COVID-19 Deciding when you can be around others is different for different situations. Find out when you can safely end home isolation. For any additional questions about your care, contact your healthcare provider or state or local health department. 05/24/2020 Content source: [facility] ([facility]), Division of Viral Diseases This information is not intended to replace advice given to you by your health care provider. Make sure you discuss any questions you have with your health care provider. Document Revised: 07/07/2020 Document Reviewed: 07/07/2020 Elsevier Patient Education  2022 [employer].     If you have been instructed to have an in-person evaluation today at a local Urgent Care facility, please use the link below. It will take you to a list of all of our available [facility], including  address, phone number and hours of operation. Please do not delay care.  [facility]  If you or a family member do not have a primary care provider, use the link below to schedule  a visit and establish care. When you choose a [facility] primary care physician or advanced practice provider, you gain a long-term partner in health. Find a Primary Care Provider  Learn more about [facility]'s in-office and virtual care options: [facility] - Get Care Now

## 2024-02-19 NOTE — Progress Notes (Signed)
° °  Thank you for the details you included in the comment boxes. Those details are very helpful in determining the best course of treatment for you and help us  to provide the best care.Because you are Covid positive and mention mild shortness of breath, we recommend that you schedule a Virtual Urgent Care video visit in order for the provider to better assess what is going on.  The provider will be able to give you a more accurate diagnosis and treatment plan if we can more freely discuss your symptoms and with the addition of a virtual examination.   If you change your visit to a video visit, we will bill your insurance (similar to an office visit) and you will not be charged for this e-Visit. You will be able to stay at home and speak with the first available [facility] advanced practice provider. The link to do a video visit is in the drop down Menu tab of your Welcome screen in MyChart.

## 2024-02-19 NOTE — Progress Notes (Signed)
 Virtual Visit Consent   Diane Norton, you are scheduled for a virtual visit with a [facility] provider today. Just as with appointments in the office, your consent must be obtained to participate. Your consent will be active for this visit and any virtual visit you may have with one of our providers in the next 365 days. If you have a MyChart account, a copy of this consent can be sent to you electronically.  As this is a virtual visit, video technology does not allow for your provider to perform a traditional examination. This may limit your provider's ability to fully assess your condition. If your provider identifies any concerns that need to be evaluated in person or the need to arrange testing (such as labs, EKG, etc.), we will make arrangements to do so. Although advances in technology are sophisticated, we cannot ensure that it will always work on either your end or our end. If the connection with a video visit is poor, the visit may have to be switched to a telephone visit. With either a video or telephone visit, we are not always able to ensure that we have a secure connection.  By engaging in this virtual visit, you consent to the provision of healthcare and authorize for your insurance to be billed (if applicable) for the services provided during this visit. Depending on your insurance coverage, you may receive a charge related to this service.  I need to obtain your verbal consent now. Are you willing to proceed with your visit today? Diane Norton has provided verbal consent on 02/19/2024 for a virtual visit (video or telephone). Delon CHRISTELLA Dickinson, PA-C  Date: 02/19/2024 8:57 AM   Virtual Visit via Video Note   I, Delon CHRISTELLA Dickinson, connected with  Diane Norton  ([id number], [birth year]) on 02/19/2024 at  8:45 AM EST by a video-enabled telemedicine application and verified that I am speaking with the correct person using two identifiers.  Location: Patient: Virtual Visit  Location Patient: Home Provider: Virtual Visit Location Provider: Home Office   I discussed the limitations of evaluation and management by telemedicine and the availability of in person appointments. The patient expressed understanding and agreed to proceed.    History of Present Illness: Diane Norton is a 38 y.o. who identifies as a female who was assigned female at birth, and is being seen today for Covid 99.  HPI: URI  This is a new problem. The current episode started in the past 7 days (Tested positive for Covid 19 on Sunday, 02/16/24; Symptoms started Saturday, 02/15/24). The problem has been unchanged. The maximum temperature recorded prior to her arrival was 101 - 101.9 F (101 highest). The fever has been present for 1 to 2 days. Associated symptoms include congestion, coughing (dry), headaches, nausea, rhinorrhea, sinus pain, a sore throat, vomiting and wheezing. Pertinent negatives include no diarrhea, ear pain, plugged ear sensation or sneezing. Associated symptoms comments: chills. Treatments tried: tylenol  cold, inhaler of a family member, cough drops. The treatment provided no relief.    Problems:  Patient Active Problem List   Diagnosis Date Noted   Insomnia 08/15/2023   Encounter for screening for cardiovascular disorders 08/15/2023   CAP (community acquired pneumonia) 01/22/2023   Elevated troponin 01/22/2023   Heroin withdrawal (HCC) 01/22/2023   Prolonged QT interval 01/22/2023   Hypocalcemia 01/22/2023   History of pulmonary embolism 01/22/2023   GERD (gastroesophageal reflux disease) 01/22/2023   Thrombocytopenia    DIC (disseminated intravascular coagulation)  Acute pulmonary embolism (HCC)    PNA (pneumonia) 10/28/2019   Acute Hypoxic respiratory failure (HCC) 10/27/2019   Asthma 10/27/2019   Multifocal pneumonia 10/27/2019   History of seizures 10/27/2019   Closed fracture of right femur, initial encounter (HCC) 01/10/2015    Allergies:  Allergies[1] Medications: Current Medications[2]  Observations/Objective: Patient is well-developed, well-nourished in no acute distress.  Resting comfortably at home.  Head is normocephalic, atraumatic.  No labored breathing.  Speech is clear and coherent with logical content.  Patient is alert and oriented at baseline.    Assessment and Plan: 1. COVID (Primary) - nirmatrelvir /ritonavir  (PAXLOVID ) 20 x 150 MG & 10 x 100MG  TABS; Take 3 tablets by mouth 2 (two) times daily for 5 days. (Take nirmatrelvir  150 mg two tablets twice daily for 5 days and ritonavir  100 mg one tablet twice daily for 5 days) Patient GFR is 106  Dispense: 30 tablet; Refill: 0 - albuterol  (VENTOLIN  HFA) 108 (90 Base) MCG/ACT inhaler; Inhale 1-2 puffs into the lungs every 6 (six) hours as needed.  Dispense: 8 g; Refill: 0 - brompheniramine-pseudoephedrine-DM 30-2-10 MG/5ML syrup; Take 5 mLs by mouth 4 (four) times daily as needed.  Dispense: 120 mL; Refill: 0 - fluticasone  (FLONASE ) 50 MCG/ACT nasal spray; Place 2 sprays into both nostrils daily.  Dispense: 16 g; Refill: 0 - ondansetron  (ZOFRAN -ODT) 4 MG disintegrating tablet; Take 1 tablet (4 mg total) by mouth every 8 (eight) hours as needed.  Dispense: 20 tablet; Refill: 0  - Continue OTC symptomatic management of choice - Will send OTC vitamins and supplement information through AVS - Paxlovid  prescribed - Added Bromfed DM for cough - Added Flonase  for congestion and drainage - Added Zofran  for nausea and vomiting - Added Albuterol  for shortness of breath and wheezing - Push fluids - Rest as needed - Work note provided - Discussed return precautions and when to seek in-person evaluation, sent via AVS as well   Follow Up Instructions: I discussed the assessment and treatment plan with the patient. The patient was provided an opportunity to ask questions and all were answered. The patient agreed with the plan and demonstrated an understanding of the  instructions.  A copy of instructions were sent to the patient via MyChart unless otherwise noted below.    The patient was advised to call back or seek an in-person evaluation if the symptoms worsen or if the condition fails to improve as anticipated.    Delon CHRISTELLA Dickinson, PA-C     [1]  Allergies Allergen Reactions   Bactrim [Sulfamethoxazole-Trimethoprim] Nausea And Vomiting   Darvocet [Propoxyphene N-Acetaminophen ] Hives   Other Other (See Comments)    darvocet   Penicillins Hives and Itching   Bactrim [Sulfamethoxazole-Trimethoprim] Rash   Ciprofloxacin  Nausea And Vomiting and Rash  [2]  Current Outpatient Medications:    albuterol  (VENTOLIN  HFA) 108 (90 Base) MCG/ACT inhaler, Inhale 1-2 puffs into the lungs every 6 (six) hours as needed., Disp: 8 g, Rfl: 0   brompheniramine-pseudoephedrine-DM 30-2-10 MG/5ML syrup, Take 5 mLs by mouth 4 (four) times daily as needed., Disp: 120 mL, Rfl: 0   fluticasone  (FLONASE ) 50 MCG/ACT nasal spray, Place 2 sprays into both nostrils daily., Disp: 16 g, Rfl: 0   nirmatrelvir /ritonavir  (PAXLOVID ) 20 x 150 MG & 10 x 100MG  TABS, Take 3 tablets by mouth 2 (two) times daily for 5 days. (Take nirmatrelvir  150 mg two tablets twice daily for 5 days and ritonavir  100 mg one tablet twice daily for 5 days) Patient GFR  is 106, Disp: 30 tablet, Rfl: 0   ondansetron  (ZOFRAN -ODT) 4 MG disintegrating tablet, Take 1 tablet (4 mg total) by mouth every 8 (eight) hours as needed., Disp: 20 tablet, Rfl: 0   doxepin  (SINEQUAN ) 25 MG capsule, Take 1 capsule (25 mg total) by mouth at bedtime., Disp: 30 capsule, Rfl: 2

## 2024-02-27 ENCOUNTER — Other Ambulatory Visit: Payer: Self-pay | Admitting: Family Medicine
